# Patient Record
Sex: Female | Born: 1950 | Race: Black or African American | Hispanic: No | Marital: Married | Smoking: Never smoker
Health system: Southern US, Community
[De-identification: ages and names within clinical notes are randomized; demographics above are authoritative.]

## PROBLEM LIST (undated history)

## (undated) DIAGNOSIS — R110 Nausea: Secondary | ICD-10-CM

## (undated) DIAGNOSIS — Z973 Presence of spectacles and contact lenses: Secondary | ICD-10-CM

## (undated) DIAGNOSIS — T451X5A Adverse effect of antineoplastic and immunosuppressive drugs, initial encounter: Secondary | ICD-10-CM

## (undated) DIAGNOSIS — C539 Malignant neoplasm of cervix uteri, unspecified: Secondary | ICD-10-CM

## (undated) DIAGNOSIS — R918 Other nonspecific abnormal finding of lung field: Secondary | ICD-10-CM

## (undated) DIAGNOSIS — T4145XA Adverse effect of unspecified anesthetic, initial encounter: Secondary | ICD-10-CM

## (undated) DIAGNOSIS — D701 Agranulocytosis secondary to cancer chemotherapy: Secondary | ICD-10-CM

## (undated) DIAGNOSIS — E041 Nontoxic single thyroid nodule: Secondary | ICD-10-CM

## (undated) DIAGNOSIS — Z86010 Personal history of colonic polyps: Secondary | ICD-10-CM

## (undated) DIAGNOSIS — T8859XA Other complications of anesthesia, initial encounter: Secondary | ICD-10-CM

## (undated) DIAGNOSIS — Z923 Personal history of irradiation: Secondary | ICD-10-CM

## (undated) HISTORY — DX: Personal history of irradiation: Z92.3

## (undated) HISTORY — PX: TONSILLECTOMY: SUR1361

---

## 2008-06-22 ENCOUNTER — Emergency Department (HOSPITAL_COMMUNITY): Admission: EM | Admit: 2008-06-22 | Discharge: 2008-06-22 | Payer: Self-pay | Admitting: Emergency Medicine

## 2013-10-16 DIAGNOSIS — Z860100 Personal history of colon polyps, unspecified: Secondary | ICD-10-CM

## 2013-10-16 HISTORY — PX: COLONOSCOPY: SHX174

## 2013-10-16 HISTORY — DX: Personal history of colon polyps, unspecified: Z86.0100

## 2013-10-16 HISTORY — DX: Personal history of colonic polyps: Z86.010

## 2017-08-03 ENCOUNTER — Telehealth: Payer: Self-pay | Admitting: *Deleted

## 2017-08-03 NOTE — Telephone Encounter (Signed)
Spoke with a nurse from [facility] regarding a referral from Dr. Charlesetta Garibaldi. Patient to be seen on November 7th at  10:15am. They will call the patient and fax over the records.

## 2017-08-07 ENCOUNTER — Telehealth: Payer: Self-pay | Admitting: Gynecology

## 2017-08-07 ENCOUNTER — Other Ambulatory Visit: Payer: Self-pay | Admitting: Obstetrics and Gynecology

## 2017-08-07 DIAGNOSIS — N838 Other noninflammatory disorders of ovary, fallopian tube and broad ligament: Secondary | ICD-10-CM

## 2017-08-07 NOTE — Telephone Encounter (Signed)
Received a call from Ulysses at [facility] to get an earlier appt for the pt. Appt has been scheduled for the pt to see Dr. Fermin Schwab on 11/2 at 145pm. [place] will notify the pt.

## 2017-08-17 ENCOUNTER — Encounter: Payer: Self-pay | Admitting: Gynecology

## 2017-08-17 ENCOUNTER — Ambulatory Visit: Payer: Medicare HMO | Attending: Gynecologic Oncology | Admitting: Gynecology

## 2017-08-17 ENCOUNTER — Encounter: Payer: Self-pay | Admitting: Radiation Oncology

## 2017-08-17 VITALS — BP 160/78 | HR 112 | Temp 97.9°F | Resp 20 | Ht 65.0 in | Wt 167.8 lb

## 2017-08-17 DIAGNOSIS — R87613 High grade squamous intraepithelial lesion on cytologic smear of cervix (HGSIL): Secondary | ICD-10-CM | POA: Insufficient documentation

## 2017-08-17 DIAGNOSIS — N839 Noninflammatory disorder of ovary, fallopian tube and broad ligament, unspecified: Secondary | ICD-10-CM

## 2017-08-17 DIAGNOSIS — C539 Malignant neoplasm of cervix uteri, unspecified: Secondary | ICD-10-CM | POA: Diagnosis not present

## 2017-08-17 DIAGNOSIS — I341 Nonrheumatic mitral (valve) prolapse: Secondary | ICD-10-CM | POA: Insufficient documentation

## 2017-08-17 DIAGNOSIS — N838 Other noninflammatory disorders of ovary, fallopian tube and broad ligament: Secondary | ICD-10-CM

## 2017-08-17 DIAGNOSIS — C541 Malignant neoplasm of endometrium: Secondary | ICD-10-CM | POA: Diagnosis not present

## 2017-08-17 NOTE — Progress Notes (Signed)
Consult Note: Gyn-Onc   Maureen Vaughan 66 y.o. female  Chief Complaint  Patient presents with  . endometrial Cancer  . 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 Solid complex 2.1 x 1.5 x 2.3 cm left adnexal mass.  Plan: The patient is pink she scheduled to have an MRI next Tuesday. I would also like to obtain a PET scan to assess for any distant disease and potentially evaluate the left adnexal mass. Ultimately if the disease couldn't seems confined to the pelvis I recommend the patient received whole pelvis radiation therapy, brachytherapy and cisplatin sensitization. If there is any suspicion that there is disease involving the left ovary the ovary could be removed laparoscopically.  The schedule for external beam radiation therapy, brachytherapy, and weekly cisplatin was outlined to the patient and her husband. Expected side effects were detailed.  HPI: 66 year old African American female seen in consultation at the request of Dr. Charlesetta Garibaldi regarding management of a newly diagnosed adenocarcinoma. The patient initially presented with postmenopausal spotting. The patient is had an extensive evaluation with the following findings. Vaginal ultrasound showed a 10 cm uterus with an intramural fibroid, left ovary is slightly enlarged measuring 2.1 x 1.5 x 2.3 cm which is described as solid complex with a vascular flow. In addition the cervix is imaged showing a solid vascular mass measuring 2.7 x 2.1 x 2.2 cm. A sonohysterogram showed no abnormalities in the endometrium. Patient had a Pap smear showing high-grade squamous intraepithelial lesion with features suspicious for invasion as well as atypical glandular cells. 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  The patient says she has had Pap smears in the last 2 years that were normal. She has no  family history of gynecologic cancers. She has not had any pelvic surgery. Overall her health is good.  Review of Systems:10 point review of systems is negative except as noted in interval history.   Vitals: Blood pressure (!) 160/78, pulse (!) 112, temperature 97.9 F (36.6 C), temperature source Oral, resp. rate 20, height 5\' 5"  (1.651 m), weight 167 lb 12.8 oz (76.1 kg), SpO2 97 %.  Physical Exam: General : The patient is a healthy woman in no acute distress.  HEENT: normocephalic, extraoccular movements normal; neck is supple without thyromegally  Lynphnodes: Supraclavicular and inguinal nodes not enlarged  Abdomen: Soft, non-tender, no ascites, no organomegally, no masses, no hernias  Pelvic:  EGBUS: Normal female  Vagina: Normal, no lesions  Urethra and Bladder: Normal, non-tender  Cervix: Is replaced by a hard nodular mass measured approximately 6 cm in diameter  Uterus: Difficult to outline given the cervical mass. Ultrasound says that is 10 cm. Bi-manual examination: There appears to be extension into the medial aspects of the parametria bilaterally (stage IIB) Rectal: normal sphincter tone, no masses, no blood  Lower extremities: No edema or varicosities. Normal range of motion      Not on File  Past Medical History:  Diagnosis Date  . Mitral valve prolapse     History reviewed. No pertinent surgical history.  No current outpatient prescriptions on file.   No current facility-administered medications for this visit.     Social History   Social History  . Marital status: Married    Spouse name: N/A  . Number of children: N/A  . Years of education: N/A   Occupational History  .  Not on file.   Social History Main Topics  . Smoking status: Never Smoker  . Smokeless tobacco: Never Used  . Alcohol use No  . Drug use: No  . Sexual activity: Yes   Other Topics Concern  . Not on file   Social History Narrative  . No narrative on file    Family History   Problem Relation Age of Onset  . Hypertension Mother   . Dementia Mother   . Colon cancer Father       Marti Sleigh, MD 08/17/2017, 2:17 PM

## 2017-08-17 NOTE — Patient Instructions (Addendum)
Plan on having a PET scan on November 14 at [facility] (nothing to eat or drink six hours before your scan) and meet with Dr. Gery Pray that afternoon (Nov 14) at 12:30 at the [facility].  We will also arrange for you to meet with Dr. Heath Lark, Medical Oncologist, to discuss and arrange for chemotherapy (weekly cisplatin).  Recommendation is for external beam radiation with weekly cisplatin chemotherapy followed by internal radiation treatments at the cervix.  Please call for any questions or concerns.

## 2017-08-20 ENCOUNTER — Telehealth: Payer: Self-pay | Admitting: *Deleted

## 2017-08-20 NOTE — Telephone Encounter (Signed)
Called patient with the new patient appt with Dr. Alvy Bimler on November 16th at 11:15am, arrive at 11am.

## 2017-08-21 ENCOUNTER — Inpatient Hospital Stay
Admission: RE | Admit: 2017-08-21 | Discharge: 2017-08-21 | Disposition: A | Discharge status: Home / Self Care | Payer: Self-pay | Source: Ambulatory Visit | Attending: Obstetrics and Gynecology | Admitting: Obstetrics and Gynecology

## 2017-08-21 ENCOUNTER — Other Ambulatory Visit: Payer: Self-pay

## 2017-08-22 ENCOUNTER — Ambulatory Visit: Payer: Self-pay | Admitting: Gynecologic Oncology

## 2017-08-24 NOTE — Progress Notes (Signed)
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 Alvy Bimler 08/31/17  Weight changes, if any: no  Bowel/Bladder complaints, if any: has noticed a small amount of urinary incontinence that started a few months ago. Denies having any bowel issues.  Nausea/Vomiting, if any: no  Pain issues, if any:  no  SAFETY ISSUES:  Prior radiation? no  Pacemaker/ICD? no  Possible current pregnancy? no  Is the patient on methotrexate? no  Current Complaints / other details:  Per Dr. Fermin Schwab - the plan is that "ultimately if the disease couldn't seems confined to the pelvis I recommend the patient received whole pelvis radiation therapy, brachytherapy and cisplatin sensitization. If there is any suspicion that there is disease involving the left ovary the ovary could be removed laparoscopically."  PET scan scheduled for 08/29/17.  BP (!) 144/74 (BP Location: Right Arm, Patient Position: Sitting)   Pulse 84   Temp 98.1 F (36.7 C) (Oral)   Ht 5\' 5"  (1.651 m)   Wt 164 lb 3.2 oz (74.5 kg)   SpO2 100%   BMI 27.32 kg/m    Wt Readings from Last 3 Encounters:  08/29/17 164 lb 3.2 oz (74.5 kg)  08/17/17 167 lb 12.8 oz (76.1 kg)

## 2017-08-28 ENCOUNTER — Telehealth: Payer: Self-pay

## 2017-08-28 NOTE — Telephone Encounter (Signed)
ENCOUNTER OPENED IN ERROR

## 2017-08-29 ENCOUNTER — Ambulatory Visit
Admission: RE | Admit: 2017-08-29 | Discharge: 2017-08-29 | Disposition: A | Discharge status: Home / Self Care | Payer: Medicare HMO | Source: Ambulatory Visit | Attending: Radiation Oncology | Admitting: Radiation Oncology

## 2017-08-29 ENCOUNTER — Encounter: Payer: Self-pay | Admitting: Radiation Oncology

## 2017-08-29 ENCOUNTER — Ambulatory Visit (HOSPITAL_COMMUNITY)
Admission: RE | Admit: 2017-08-29 | Discharge: 2017-08-29 | Disposition: A | Discharge status: Home / Self Care | Payer: Medicare HMO | Source: Ambulatory Visit | Attending: Gynecologic Oncology | Admitting: Gynecologic Oncology

## 2017-08-29 ENCOUNTER — Other Ambulatory Visit: Payer: Self-pay

## 2017-08-29 VITALS — BP 144/74 | HR 84 | Temp 98.1°F | Ht 65.0 in | Wt 164.2 lb

## 2017-08-29 DIAGNOSIS — R590 Localized enlarged lymph nodes: Secondary | ICD-10-CM | POA: Diagnosis not present

## 2017-08-29 DIAGNOSIS — C539 Malignant neoplasm of cervix uteri, unspecified: Secondary | ICD-10-CM | POA: Insufficient documentation

## 2017-08-29 DIAGNOSIS — Z800 Family history of malignant neoplasm of digestive organs: Secondary | ICD-10-CM | POA: Diagnosis not present

## 2017-08-29 DIAGNOSIS — C775 Secondary and unspecified malignant neoplasm of intrapelvic lymph nodes: Secondary | ICD-10-CM | POA: Diagnosis not present

## 2017-08-29 DIAGNOSIS — Z79899 Other long term (current) drug therapy: Secondary | ICD-10-CM | POA: Diagnosis not present

## 2017-08-29 DIAGNOSIS — Z8249 Family history of ischemic heart disease and other diseases of the circulatory system: Secondary | ICD-10-CM | POA: Insufficient documentation

## 2017-08-29 DIAGNOSIS — R918 Other nonspecific abnormal finding of lung field: Secondary | ICD-10-CM | POA: Insufficient documentation

## 2017-08-29 DIAGNOSIS — C530 Malignant neoplasm of endocervix: Secondary | ICD-10-CM | POA: Insufficient documentation

## 2017-08-29 DIAGNOSIS — Z780 Asymptomatic menopausal state: Secondary | ICD-10-CM | POA: Diagnosis not present

## 2017-08-29 DIAGNOSIS — Z510 Encounter for antineoplastic radiation therapy: Secondary | ICD-10-CM | POA: Diagnosis not present

## 2017-08-29 DIAGNOSIS — Z9889 Other specified postprocedural states: Secondary | ICD-10-CM | POA: Insufficient documentation

## 2017-08-29 DIAGNOSIS — I341 Nonrheumatic mitral (valve) prolapse: Secondary | ICD-10-CM | POA: Diagnosis not present

## 2017-08-29 DIAGNOSIS — Z8489 Family history of other specified conditions: Secondary | ICD-10-CM | POA: Diagnosis not present

## 2017-08-29 DIAGNOSIS — E042 Nontoxic multinodular goiter: Secondary | ICD-10-CM | POA: Diagnosis not present

## 2017-08-29 LAB — CBC WITH DIFFERENTIAL/PLATELET
BASO%: 0.6 % (ref 0.0–2.0)
BASOS ABS: 0 10*3/uL (ref 0.0–0.1)
EOS%: 0.8 % (ref 0.0–7.0)
Eosinophils Absolute: 0 10*3/uL (ref 0.0–0.5)
HEMATOCRIT: 39.3 % (ref 34.8–46.6)
HEMOGLOBIN: 12.9 g/dL (ref 11.6–15.9)
LYMPH#: 1.2 10*3/uL (ref 0.9–3.3)
LYMPH%: 28.7 % (ref 14.0–49.7)
MCH: 28.8 pg (ref 25.1–34.0)
MCHC: 32.9 g/dL (ref 31.5–36.0)
MCV: 87.6 fL (ref 79.5–101.0)
MONO#: 0.3 10*3/uL (ref 0.1–0.9)
MONO%: 7 % (ref 0.0–14.0)
NEUT%: 62.9 % (ref 38.4–76.8)
NEUTROS ABS: 2.6 10*3/uL (ref 1.5–6.5)
Platelets: 232 10*3/uL (ref 145–400)
RBC: 4.49 10*6/uL (ref 3.70–5.45)
RDW: 12.8 % (ref 11.2–14.5)
WBC: 4.2 10*3/uL (ref 3.9–10.3)

## 2017-08-29 LAB — COMPREHENSIVE METABOLIC PANEL
ALT: 10 U/L (ref 0–55)
AST: 17 U/L (ref 5–34)
Albumin: 4.2 g/dL (ref 3.5–5.0)
Alkaline Phosphatase: 79 U/L (ref 40–150)
Anion Gap: 9 mEq/L (ref 3–11)
BILIRUBIN TOTAL: 0.46 mg/dL (ref 0.20–1.20)
BUN: 9.6 mg/dL (ref 7.0–26.0)
CO2: 27 meq/L (ref 22–29)
Calcium: 10 mg/dL (ref 8.4–10.4)
Chloride: 106 mEq/L (ref 98–109)
Creatinine: 1 mg/dL (ref 0.6–1.1)
EGFR: 58 mL/min/{1.73_m2} — AB (ref >60)
GLUCOSE: 86 mg/dL (ref 70–140)
Potassium: 4.2 mEq/L (ref 3.5–5.1)
SODIUM: 142 meq/L (ref 136–145)
TOTAL PROTEIN: 7.9 g/dL (ref 6.4–8.3)

## 2017-08-29 LAB — MAGNESIUM: MAGNESIUM: 2.1 mg/dL (ref 1.5–2.5)

## 2017-08-29 LAB — GLUCOSE, CAPILLARY: GLUCOSE-CAPILLARY: 104 mg/dL — AB (ref 65–99)

## 2017-08-29 MED ORDER — FLUDEOXYGLUCOSE F - 18 (FDG) INJECTION
8.3000 | Freq: Once | INTRAVENOUS | Status: AC | PRN
  Administered 2017-08-29: 8.3 via INTRAVENOUS

## 2017-08-29 NOTE — Progress Notes (Signed)
Radiation Oncology         [phone] ________________________________  Initial Outpatient Consultation  Name: Maureen Vaughan MRN: [id number]  Date: 08/29/2017  DOB: 1951/04/10  [id number], Maureen Rossetti, MD  Marti Sleigh   REFERRING PHYSICIAN: Marti Sleigh  DIAGNOSIS: Stage II-B adenocarcinoma of the cervix with radiographically PET+ left external iliac adenopathy   HISTORY OF PRESENT ILLNESS:Maureen Vaughan is a 66 y.o. female who is here to discuss the role of radiotherapy in the management of her cervical cancer. Initially, the patient presented with postmenopausal spotting dating back several months. She presented into her OB/GYN's office regarding this, at that time she had workup including vaginal [place] which showed a 10cm uterus with an intramural fibroid., the left ovary was slightly enlarged measuring 2.1 x 1.5 x 2.3cm which was described as solid complex with vascular flow. Additionally, the cervix contained a solid vascularized mass measuring at 2.7 x 2.1 x 2.2cm. Sonohysterography was performed which did not show abnormality within the endometrium. Following this, she had a pap smear which demonstrated the presence of high-grade squamous intraepithelial lesion with features suspicious for invasion as well as atypical glandular cells. Endometrium biopsy was on taken 08/01/17 which revealed grade 1 endometrioid adenocarcinoma.   On 08/17/17 she consulted with Dr Fermin Schwab of [facility]. Pelvic exam at that time revealed an enlarged cervix with bilateral parametrial extension. He has ordered a PET/CT which will be performed  today. Additionally, he is recommending whole pelvis radiation therapy with brachytherapy and cisplatin sensitization if the disease is confined to the pelvis. An MRI has also been ordered, but no definitive date has been scheduled for this yet secondary to insurance reasons.   Overall, she reports that prior to her diagnosis that she had routine pap  smears which had all been normal until her most recent one. She has only had postmenopausal spotting prior to her diagnosis, but otherwise she was feeling at her baseline and without acute complaint. She denies pelvic pain, diarrhea, constipation, hematuria, hematochezia, melena, fatigue, loss of appetite, or any other associated symptoms. As far as she knows, she does not have any history of cancer within her family. She has not had any major surgeries within the pelvis.   On review of systems, pt denies fever, chills, rash, mouth sores, weight loss, decreased appetite, urinary complaints, leg swelling. Denies pain, headache, or visual issues. Pt denies abdominal pain, nausea, vomiting. Notable for that as listed in the above HPI.  PREVIOUS RADIATION THERAPY: No  PAST MEDICAL HISTORY:  has a past medical history of Mitral valve prolapse.    PAST SURGICAL HISTORY: Past Surgical History:  Procedure Laterality Date  . TONSILECTOMY, ADENOIDECTOMY, BILATERAL MYRINGOTOMY AND TUBES      FAMILY HISTORY: family history includes Colon cancer in her father; Dementia in her mother; Hypertension in her mother.  SOCIAL HISTORY:  reports that  has never smoked. she has never used smokeless tobacco. She reports that she does not drink alcohol or use drugs.  ALLERGIES: Patient has no known allergies.  MEDICATIONS:  Current Outpatient Medications  Medication Sig Dispense Refill  . AMOXICILLIN PO 500 mg.    . Multiple Vitamins-Minerals (MULTIVITAMIN ADULT EXTRA C PO) multivitamin  1 q day     No current facility-administered medications for this encounter.     REVIEW OF SYSTEMS:  A 10+ POINT REVIEW OF SYSTEMS WAS OBTAINED including neurology, dermatology, psychiatry, cardiac, respiratory, lymph, extremities, GI, GU, Musculoskeletal, constitutional, breasts, reproductive, HEENT.  All pertinent positives are noted in  the HPI.  All others are negative.   PHYSICAL EXAM:  height is 5\' 5"  (1.651 m) and  weight is 164 lb 3.2 oz (74.5 kg). Her oral temperature is 98.1 F (36.7 C). Her blood pressure is 144/74 (abnormal) and her pulse is 84. Her oxygen saturation is 100%.   General: Alert and oriented, in no acute distress HEENT: Head is normocephalic. Extraocular movements are intact. Oropharynx is clear. Neck: Neck is supple, no palpable cervical or supraclavicular lymphadenopathy. Heart: Regular in rate and rhythm with no murmurs, rubs, or gallops. Chest: Clear to auscultation bilaterally, with no rhonchi, wheezes, or rales. Abdomen: Soft, nontender, nondistended, with no rigidity or guarding. Extremities: No cyanosis or edema. Lymphatics: see Neck Exam Skin: No concerning lesions. Musculoskeletal: symmetric strength and muscle tone throughout. Neurologic: Cranial nerves II through XII are grossly intact. No obvious focalities. Speech is fluent. Coordination is intact. Psychiatric: Judgment and insight are intact. Affect is appropriate. On pelvic examination the external genitalia were unremarkable. A speculum exam was performed with a large mass noted in the proximal vagina. With palpation she has a rock hard mass replacing the cervix, estimated to be at least 6cm on bimanual and rectovaginal exam. Some oozing is noted from the cervical os region, but no significant bleeding is noted. No obvious vaginal involvement. Possible right parametrial involvement on exam. Slightly decreased rectal tone is noted on digital rectal exam.   ECOG = 0  LABORATORY DATA:  Lab Results  Component Value Date   WBC 4.2 08/29/2017   HGB 12.9 08/29/2017   HCT 39.3 08/29/2017   MCV 87.6 08/29/2017   PLT 232 08/29/2017   NEUTROABS 2.6 08/29/2017   Lab Results  Component Value Date   NA 142 08/29/2017   K 4.2 08/29/2017   CO2 27 08/29/2017   GLUCOSE 86 08/29/2017   CREATININE 1.0 08/29/2017   CALCIUM 10.0 08/29/2017    RADIOGRAPHY: Nm Pet Image Initial (pi) Skull Base To Thigh  Result Date:  08/29/2017 CLINICAL DATA:  Initial treatment strategy for cervical cancer. EXAM: NUCLEAR MEDICINE PET SKULL BASE TO THIGH TECHNIQUE: 8.3 mCi F-18 FDG was injected intravenously. Full-ring PET imaging was performed from the skull base to thigh after the radiotracer. CT data was obtained and used for attenuation correction and anatomic localization. FASTING BLOOD GLUCOSE:  Value: 104 mg/dl COMPARISON:  None. FINDINGS: NECK Hypodense right thyroid nodule 3.6 cm in diameter, without accentuated hypermetabolic activity. Smaller 1.3 cm isthmic hypodense nodule. Left thyroid nodule 0.9 cm in diameter. CHEST Twelve scattered small pulmonary nodules measuring up to 5 mm in diameter do not appear appreciably hypermetabolic but which are below sensitive PET-CT size thresholds. No hypermetabolic thoracic adenopathy. ABDOMEN/PELVIS The dominant cervical mass has a maximum SUV of 16.0 with hypermetabolic activity measuring approximately 6.2 by 6.0 by 7.3 cm. A left external iliac lymph node adjacent to the pelvic sidewall measures 1.5 cm in short axis on image 162/4, with maximum SUV 10.0. No abnormal activity in the liver, spleen, pancreas, or adrenal glands. There are a few sigmoid colon diverticula. SKELETON No focal hypermetabolic activity to suggest skeletal metastasis. IMPRESSION: 1. Cervical malignancy with maximum SUV 6.0, dominant cervical mass measuring 6.2 by 6.0 by 7.3 cm. 2. 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 3. There 12 scattered small pulmonary nodules measuring up to 5 mm in diameter. No hypermetabolic activity but these are below sensitive PET-CT size thresholds. These could be postinflammatory or less likely  neoplastic, surveillance is recommended. 4. Hypodense thyroid nodules are not hypermetabolic which strongly favors benign etiology. Electronically Signed   By: Van Clines M.D.   On: 08/29/2017 13:19      IMPRESSION: Maureen Vaughan is a  very pleasant 66 y.o. female who presents today to discuss the role of radiotherapy in the ongoing management of their stage II-B adenocarcinoma of the cervix with radiographically PET+ left external iliac adenopathy. It was a pleasure meeting the patient today. We discussed at great length the current state of her cancer and the overall goals of care. The patient would be a good candidate for a definitive course of therapy including external beam radiotherapy, radiosenstizing chemotherapy, and intracavitary brachytherapy treatment. The risks vs benefits, side effects, and potential toxicities were discussed at great length with the patient involving  high dose radiation directed at the pelvis. The patient appears to understand and wishes to proceed with planned course of treatment.   PLAN:  She will be scheduled for CT sim on November 19th with treatments likely to begin in the last week of november along with radiosensitizing chemotherapy. I anticipate approximately 6wks of EBRT, plus 5 intracavitary high dose brachytherapy treatments. We will check on the status of the pt's MRI which would be helpful in planning her brachytherapy treatments. I have informed her to call the clinic with any questions or concerns in the interim should she have any. She will meet with medical oncology later this week. ------------------------------------------------  Blair Promise, PhD, MD  This document serves as a record of services personally performed by Gery Pray, MD. It was created on his behalf by Reola Mosher, a trained medical scribe. The creation of this record is based on the scribe's personal observations and the provider's statements to them. This document has been checked and approved by the attending provider.

## 2017-08-31 ENCOUNTER — Telehealth: Payer: Self-pay | Admitting: Hematology and Oncology

## 2017-08-31 ENCOUNTER — Encounter: Payer: Self-pay | Admitting: Hematology and Oncology

## 2017-08-31 ENCOUNTER — Ambulatory Visit (HOSPITAL_BASED_OUTPATIENT_CLINIC_OR_DEPARTMENT_OTHER): Payer: Medicare HMO | Admitting: Hematology and Oncology

## 2017-08-31 VITALS — BP 151/71 | HR 111 | Temp 98.2°F | Resp 18 | Ht 65.0 in | Wt 164.2 lb

## 2017-08-31 DIAGNOSIS — C541 Malignant neoplasm of endometrium: Secondary | ICD-10-CM | POA: Diagnosis not present

## 2017-08-31 DIAGNOSIS — R918 Other nonspecific abnormal finding of lung field: Secondary | ICD-10-CM | POA: Insufficient documentation

## 2017-08-31 DIAGNOSIS — C774 Secondary and unspecified malignant neoplasm of inguinal and lower limb lymph nodes: Secondary | ICD-10-CM | POA: Diagnosis not present

## 2017-08-31 DIAGNOSIS — C539 Malignant neoplasm of cervix uteri, unspecified: Secondary | ICD-10-CM

## 2017-08-31 NOTE — Assessment & Plan Note (Signed)
She has multiple indeterminate lung nodules The cause is unknown Malignancy cannot be excluded It is not possible to order lung biopsy To give her the benefit of the doubt, we will proceed with concurrent chemoradiation therapy with curative intent She would need repeat imaging study of the chest sooner than later after completion of treatment Currently, the patient is not symptomatic

## 2017-08-31 NOTE — Progress Notes (Signed)
START OFF PATHWAY REGIMEN - [Other Dx]   [id number]:Cisplatin 40 mg/m2 weekly (4 weeks per order sheet):   Administer weekly:     Cisplatin   **Always confirm dose/schedule in your pharmacy ordering system**    Patient Characteristics: Intent of Therapy: Curative Intent, Discussed with Patient

## 2017-08-31 NOTE — Progress Notes (Signed)
[place] CONSULT NOTE  Patient Care Team: Cloward, [name], MD as PCP - General (Internal Medicine) Heath Lark, MD as Consulting Physician (Hematology and Oncology)  CHIEF COMPLAINTS/PURPOSE OF CONSULTATION:  Newly diagnosed cervical cancer, for concurrent chemoradiation therapy  HISTORY OF PRESENTING ILLNESS:  Maureen Vaughan 66 y.o. female is here accompanied by her husband, Ron They have been married for 41 years She is a retired Pharmacist, hospital.  She has 1 daughter and 2 grandchildren. The patient denies prior history of abnormal Pap smear. She started to have postmenopausal vaginal spotting several months ago and underwent further evaluation and was subsequently diagnosed with cervical cancer. I have review her records and summarized as follows:   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[place])   07/04/2017 Initial Diagnosis    She presented to the GYN clinic with postmenopausal bleeding. Examination revealed abnormal cervix. Vaginal ultrasound showed a 10 cm uterus with an intramural fibroid, left ovary is slightly enlarged measuring 2.1 x 1.5 x 2.3 cm which is described as solid complex with a vascular flow. In addition the cervix is imaged showing a solid vascular mass measuring 2.7 x 2.1 x 2.2 cm. A sonohysterogram showed no abnormalities in the endometrium. Patient had a Pap smear showing high-grade squamous intraepithelial lesion with features suspicious for invasion as well as atypical glandular cells. 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      08/29/2017 PET scan    1. Cervical malignancy with maximum SUV 6.0, dominant cervical mass measuring 6.2 by 6.0 by 7.3 cm. 2. 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 3. There 12 scattered small pulmonary nodules measuring up to 5 mm in diameter. No hypermetabolic activity but these are below sensitive PET-CT  size thresholds. These could be postinflammatory or less likely neoplastic, surveillance is recommended. 4. Hypodense thyroid nodules are not hypermetabolic which strongly favors benign etiology.      She continues to have sporadic vaginal spotting.  She denies pelvic pain.  She denies any interference with her bowel function or difficulties with urination Her appetite is stable. She had recent intentional weight loss, stable.  MEDICAL HISTORY:  Past Medical History:  Diagnosis Date  . Mitral valve prolapse     SURGICAL HISTORY: Past Surgical History:  Procedure Laterality Date  . COLONOSCOPY    . TONSILECTOMY, ADENOIDECTOMY, BILATERAL MYRINGOTOMY AND TUBES      SOCIAL HISTORY: Social History   Socioeconomic History  . Marital status: Married    Spouse name: Not on file  . Number of children: 1  . Years of education: Not on file  . Highest education level: Not on file  Social Needs  . Financial resource strain: Not on file  . Food insecurity - worry: Not on file  . Food insecurity - inability: Not on file  . Transportation needs - medical: Not on file  . Transportation needs - non-medical: Not on file  Occupational History  . Occupation: retired  Tobacco Use  . Smoking status: Never Smoker  . Smokeless tobacco: Never Used  Substance and Sexual Activity  . Alcohol use: No  . Drug use: No  . Sexual activity: Yes  Other Topics Concern  . Not on file  Social History Narrative  . Not on file    FAMILY HISTORY: Family History  Problem Relation Age of Onset  . Hypertension Mother   . Dementia Mother   .  Colon cancer Father 54    ALLERGIES:  has No Known Allergies.  MEDICATIONS:  Current Outpatient Medications  Medication Sig Dispense Refill  . AMOXICILLIN PO 500 mg.    . Multiple Vitamins-Minerals (MULTIVITAMIN ADULT EXTRA C PO) multivitamin  1 q day     No current facility-administered medications for this visit.     REVIEW OF SYSTEMS:    Constitutional: Denies fevers, chills or abnormal night sweats Eyes: Denies blurriness of vision, double vision or watery eyes Ears, nose, mouth, throat, and face: Denies mucositis or sore throat Respiratory: Denies cough, dyspnea or wheezes Cardiovascular: Denies palpitation, chest discomfort or lower extremity swelling Gastrointestinal:  Denies nausea, heartburn or change in bowel habits Skin: Denies abnormal skin rashes Lymphatics: Denies new lymphadenopathy or easy bruising Neurological:Denies numbness, tingling or new weaknesses Behavioral/Psych: Mood is stable, no new changes  All other systems were reviewed with the patient and are negative.  PHYSICAL EXAMINATION: ECOG PERFORMANCE STATUS: 1 - Symptomatic but completely ambulatory  Vitals:   08/31/17 1055  BP: (!) 151/71  Pulse: (!) 111  Resp: 18  Temp: 98.2 F (36.8 C)  SpO2: 100%   Filed Weights   08/31/17 1055  Weight: 164 lb 3.2 oz (74.5 kg)    GENERAL:alert, no distress and comfortable SKIN: skin color, texture, turgor are normal, no rashes or significant lesions EYES: normal, conjunctiva are pink and non-injected, sclera clear OROPHARYNX:no exudate, no erythema and lips, buccal mucosa, and tongue normal  NECK: supple, thyroid normal size, non-tender, without nodularity LYMPH:  no palpable lymphadenopathy in the cervical, axillary or inguinal LUNGS: clear to auscultation and percussion with normal breathing effort HEART: regular rate & rhythm and no murmurs and no lower extremity edema ABDOMEN:abdomen soft, non-tender and normal bowel sounds Musculoskeletal:no cyanosis of digits and no clubbing  PSYCH: alert & oriented x 3 with fluent speech NEURO: no focal motor/sensory deficits  LABORATORY DATA:  I have reviewed the data as listed Lab Results  Component Value Date   WBC 4.2 08/29/2017   HGB 12.9 08/29/2017   HCT 39.3 08/29/2017   MCV 87.6 08/29/2017   PLT 232 08/29/2017   Recent Labs     08/29/17 1440  NA 142  K 4.2  CO2 27  GLUCOSE 86  BUN 9.6  CREATININE 1.0  CALCIUM 10.0  PROT 7.9  ALBUMIN 4.2  AST 17  ALT 10  ALKPHOS 79  BILITOT 0.46    RADIOGRAPHIC STUDIES: I have reviewed the imaging study with the patient and her husband  I have personally reviewed the radiological images as listed and agreed with the findings in the report. Nm Pet Image Initial (pi) Skull Base To Thigh  Result Date: 08/29/2017 CLINICAL DATA:  Initial treatment strategy for cervical cancer. EXAM: NUCLEAR MEDICINE PET SKULL BASE TO THIGH TECHNIQUE: 8.3 mCi F-18 FDG was injected intravenously. Full-ring PET imaging was performed from the skull base to thigh after the radiotracer. CT data was obtained and used for attenuation correction and anatomic localization. FASTING BLOOD GLUCOSE:  Value: 104 mg/dl COMPARISON:  None. FINDINGS: NECK Hypodense right thyroid nodule 3.6 cm in diameter, without accentuated hypermetabolic activity. Smaller 1.3 cm isthmic hypodense nodule. Left thyroid nodule 0.9 cm in diameter. CHEST Twelve scattered small pulmonary nodules measuring up to 5 mm in diameter do not appear appreciably hypermetabolic but which are below sensitive PET-CT size thresholds. No hypermetabolic thoracic adenopathy. ABDOMEN/PELVIS The dominant cervical mass has a maximum SUV of 16.0 with hypermetabolic activity measuring approximately 6.2 by 6.0  by 7.3 cm. A left external iliac lymph node adjacent to the pelvic sidewall measures 1.5 cm in short axis on image 162/4, with maximum SUV 10.0. No abnormal activity in the liver, spleen, pancreas, or adrenal glands. There are a few sigmoid colon diverticula. SKELETON No focal hypermetabolic activity to suggest skeletal metastasis. IMPRESSION: 1. Cervical malignancy with maximum SUV 6.0, dominant cervical mass measuring 6.2 by 6.0 by 7.3 cm. 2. 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 3. There 12 scattered  small pulmonary nodules measuring up to 5 mm in diameter. No hypermetabolic activity but these are below sensitive PET-CT size thresholds. These could be postinflammatory or less likely neoplastic, surveillance is recommended. 4. Hypodense thyroid nodules are not hypermetabolic which strongly favors benign etiology. Electronically Signed   By: Van Clines M.D.   On: 08/29/2017 13:19    ASSESSMENT & PLAN:  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[place]) We discussed the role of chemotherapy. The intent is of curative intent.  We discussed some of the risks, benefits, side-effects of cisplatin  Some of the short term side-effects included, though not limited to, including weight loss, life threatening infections, risk of allergic reactions, need for transfusions of blood products, nausea, vomiting, change in bowel habits, loss of hair, admission to hospital for various reasons, and risks of death.   Long term side-effects are also discussed including risks of infertility, permanent damage to nerve function, hearing loss, chronic fatigue, kidney damage with possibility needing hemodialysis, and rare secondary malignancy including bone marrow disorders.  The patient is aware that the response rates discussed earlier is not guaranteed.  After a long discussion, patient made an informed decision to proceed with the prescribed plan of care.   Patient education material was dispensed. I will schedule chemo education class I will schedule port placement I will coordinate the start date of treatment with radiation oncologist Tentatively, the best start date based on the schedule would be around September 12, 2017 She will be seen weekly with blood work before each dose of treatment I will see her back on September 11, 2017 for final chemotherapy consent    Multiple lung nodules She has multiple indeterminate lung nodules The cause is unknown Malignancy cannot be excluded It is not possible to order lung  biopsy To give her the benefit of the doubt, we will proceed with concurrent chemoradiation therapy with curative intent She would need repeat imaging study of the chest sooner than later after completion of treatment Currently, the patient is not symptomatic    Orders Placed This Encounter  Procedures  . IR FLUORO GUIDE PORT INSERTION RIGHT    Pt not available until 11/26    Standing Status:   Future    Standing Expiration Date:   10/31/2018    Order Specific Question:   Reason for Exam (SYMPTOM  OR DIAGNOSIS REQUIRED)    Answer:   need port for chemo.    Order Specific Question:   Preferred Imaging Location?    Answer:   [facility]  . CBC with Differential/Platelet    Standing Status:   Standing    Number of Occurrences:   22    Standing Expiration Date:   08/31/2018  . Comprehensive metabolic panel    Standing Status:   Standing    Number of Occurrences:   22    Standing Expiration Date:   08/31/2018  . Magnesium    Standing Status:  Standing    Number of Occurrences:   22    Standing Expiration Date:   08/31/2018      All questions were answered. The patient knows to call the clinic with any problems, questions or concerns. I spent 60 minutes counseling the patient face to face. The total time spent in the appointment was 80 minutes and more than 50% was on counseling.     Heath Lark, MD 08/31/2017 2:32 PM

## 2017-08-31 NOTE — Assessment & Plan Note (Signed)
We discussed the role of chemotherapy. The intent is of curative intent.  We discussed some of the risks, benefits, side-effects of cisplatin  Some of the short term side-effects included, though not limited to, including weight loss, life threatening infections, risk of allergic reactions, need for transfusions of blood products, nausea, vomiting, change in bowel habits, loss of hair, admission to hospital for various reasons, and risks of death.   Long term side-effects are also discussed including risks of infertility, permanent damage to nerve function, hearing loss, chronic fatigue, kidney damage with possibility needing hemodialysis, and rare secondary malignancy including bone marrow disorders.  The patient is aware that the response rates discussed earlier is not guaranteed.  After a long discussion, patient made an informed decision to proceed with the prescribed plan of care.   Patient education material was dispensed. I will schedule chemo education class I will schedule port placement I will coordinate the start date of treatment with radiation oncologist Tentatively, the best start date based on the schedule would be around September 12, 2017 She will be seen weekly with blood work before each dose of treatment I will see her back on September 11, 2017 for final chemotherapy consent

## 2017-08-31 NOTE — Telephone Encounter (Signed)
Scheduled appt per 11/16 los - unable to schedule 11/28 due to cap - patient is aware and will be contacted when approved for schedule - gave patient AVS and calender of appts scheduled .

## 2017-09-03 ENCOUNTER — Telehealth: Payer: Self-pay | Admitting: *Deleted

## 2017-09-03 ENCOUNTER — Encounter: Payer: Self-pay | Admitting: Radiation Oncology

## 2017-09-03 ENCOUNTER — Ambulatory Visit
Admission: RE | Admit: 2017-09-03 | Discharge: 2017-09-03 | Disposition: A | Discharge status: Home / Self Care | Payer: Medicare HMO | Source: Ambulatory Visit | Attending: Radiation Oncology | Admitting: Radiation Oncology

## 2017-09-03 VITALS — BP 151/75 | HR 103 | Temp 98.4°F | Wt 163.6 lb

## 2017-09-03 DIAGNOSIS — C539 Malignant neoplasm of cervix uteri, unspecified: Secondary | ICD-10-CM

## 2017-09-03 DIAGNOSIS — Z510 Encounter for antineoplastic radiation therapy: Secondary | ICD-10-CM | POA: Diagnosis not present

## 2017-09-03 DIAGNOSIS — C530 Malignant neoplasm of endocervix: Secondary | ICD-10-CM

## 2017-09-03 MED ORDER — SODIUM CHLORIDE 0.9% FLUSH
10.0000 mL | Freq: Once | INTRAVENOUS | Status: AC
  Administered 2017-09-03: 10 mL via INTRAVENOUS

## 2017-09-03 NOTE — Telephone Encounter (Signed)
[facility] IR will draw [facility], CMP with port placement.  Patient notified that she will have port placed at [facility] on 11/26- arrive at 0800- check in at Admitting, NPO after midnight and needs to have driver. Verbalized understanding.

## 2017-09-03 NOTE — Telephone Encounter (Signed)
-----   Message from Heath Lark, MD sent at 08/31/2017  2:31 PM EST ----- Regarding: IR port She is going out of town so I ordered port on 11/26. Please get radiologist to do CBC and CMP preop please

## 2017-09-03 NOTE — Progress Notes (Signed)
Has armband been applied?  Yes.    Does patient have an allergy to IV contrast dye?: No.   Has patient ever received premedication for IV contrast dye?:  no  Does patient take metformin?: No.  If patient does take metformin when was the last dose: n/a  Date of lab work: August 29, 2017 BUN: 9.6 CR: 1.0  IV site: antecubital right, condition patent and no redness  Has IV site been added to flowsheet?  Yes.    There were no vitals taken for this visit. BP (!) 151/75   Pulse (!) 103   Temp 98.4 F (36.9 C) (Oral)   Wt 163 lb 9.6 oz (74.2 kg)   SpO2 100%   BMI 27.22 kg/m   Wt Readings from Last 3 Encounters:  09/03/17 163 lb 9.6 oz (74.2 kg)  08/31/17 164 lb 3.2 oz (74.5 kg)  08/29/17 164 lb 3.2 oz (74.5 kg)

## 2017-09-03 NOTE — Progress Notes (Signed)
  Radiation Oncology         [phone] ________________________________  Name: Maureen Vaughan MRN: [id number]  Date: 09/03/2017  DOB: [birth year]  SIMULATION AND TREATMENT PLANNING NOTE    ICD-10-CM   1. 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  Identity was confirmed.  All relevant records and images related to the planned course of therapy were reviewed.  The patient freely provided informed written consent to proceed with treatment after reviewing the details related to the planned course of therapy. The consent form was witnessed and verified by the simulation staff.  Then, the patient was set-up in a stable reproducible  supine position for radiation therapy.  CT images were obtained.  Surface markings were placed.  The CT images were loaded into the planning software.  Then the target and avoidance structures were contoured.  Treatment planning then occurred.  The radiation prescription was entered and confirmed.  Then, I designed and supervised the construction of a total of 5 medically necessary complex treatment devices.  I have requested : 3D Simulation  I have requested a DVH of the following structures: Cervix, uterus, left external iliac node, bladder, rectum, small bowel.  I have ordered:dose calc.  PLAN:  The patient will receive 45 Gy in 25 fractions directed at the pelvis region. The patient will proceed with the sidewall boost of 9 gray in 5 fractions. She will then receive 3 additional treatments to the left external iliac node that was PET positive on CT/PET scan for an additional dose of 5.4 gray to this area. The patient will be treated with radiosensitizing chemotherapy for her first 25 treatments. After completion of external beam radiation therapy the patient will then proceed with 5  intracavitary brachytherapy treatments using iridium 192 as the high-dose-rate source.  -----------------------------------  Blair Promise, PhD, MD

## 2017-09-04 ENCOUNTER — Other Ambulatory Visit: Payer: Medicare HMO

## 2017-09-04 ENCOUNTER — Encounter: Payer: Self-pay | Admitting: *Deleted

## 2017-09-05 ENCOUNTER — Other Ambulatory Visit: Payer: Self-pay | Admitting: Radiology

## 2017-09-07 ENCOUNTER — Other Ambulatory Visit: Payer: Self-pay | Admitting: Radiology

## 2017-09-10 ENCOUNTER — Ambulatory Visit (HOSPITAL_COMMUNITY)
Admission: RE | Admit: 2017-09-10 | Discharge: 2017-09-10 | Disposition: A | Discharge status: Home / Self Care | Payer: Medicare HMO | Source: Ambulatory Visit | Attending: Hematology and Oncology | Admitting: Hematology and Oncology

## 2017-09-10 ENCOUNTER — Encounter (HOSPITAL_COMMUNITY): Payer: Self-pay | Admitting: Diagnostic Radiology

## 2017-09-10 ENCOUNTER — Other Ambulatory Visit: Payer: Self-pay | Admitting: Hematology and Oncology

## 2017-09-10 DIAGNOSIS — I341 Nonrheumatic mitral (valve) prolapse: Secondary | ICD-10-CM | POA: Insufficient documentation

## 2017-09-10 DIAGNOSIS — C539 Malignant neoplasm of cervix uteri, unspecified: Secondary | ICD-10-CM | POA: Diagnosis not present

## 2017-09-10 HISTORY — PX: IR US GUIDE VASC ACCESS RIGHT: IMG2390

## 2017-09-10 HISTORY — PX: IR FLUORO GUIDE PORT INSERTION RIGHT: IMG5741

## 2017-09-10 LAB — BASIC METABOLIC PANEL
ANION GAP: 10 (ref 5–15)
BUN: 8 mg/dL (ref 6–20)
CO2: 27 mmol/L (ref 22–32)
Calcium: 9.5 mg/dL (ref 8.9–10.3)
Chloride: 104 mmol/L (ref 101–111)
Creatinine, Ser: 1.02 mg/dL — ABNORMAL HIGH (ref 0.44–1.00)
GFR calc Af Amer: >60 mL/min (ref >60)
GFR, EST NON AFRICAN AMERICAN: 56 mL/min — AB (ref >60)
GLUCOSE: 97 mg/dL (ref 65–99)
POTASSIUM: 3.8 mmol/L (ref 3.5–5.1)
Sodium: 141 mmol/L (ref 135–145)

## 2017-09-10 LAB — CBC
HEMATOCRIT: 38.4 % (ref 36.0–46.0)
HEMOGLOBIN: 12.7 g/dL (ref 12.0–15.0)
MCH: 29.1 pg (ref 26.0–34.0)
MCHC: 33.1 g/dL (ref 30.0–36.0)
MCV: 87.9 fL (ref 78.0–100.0)
Platelets: 263 10*3/uL (ref 150–400)
RBC: 4.37 MIL/uL (ref 3.87–5.11)
RDW: 12.6 % (ref 11.5–15.5)
WBC: 4.8 10*3/uL (ref 4.0–10.5)

## 2017-09-10 LAB — PROTIME-INR
INR: 1.02
Prothrombin Time: 13.3 seconds (ref 11.4–15.2)

## 2017-09-10 MED ORDER — SODIUM CHLORIDE 0.9 % IV SOLN: INTRAVENOUS | Status: DC

## 2017-09-10 MED ORDER — FENTANYL CITRATE (PF) 100 MCG/2ML IJ SOLN
INTRAMUSCULAR | Status: AC | PRN
  Administered 2017-09-10: 50 ug via INTRAVENOUS

## 2017-09-10 MED ORDER — CEFAZOLIN SODIUM-DEXTROSE 2-4 GM/100ML-% IV SOLN
INTRAVENOUS | Status: AC
  Filled 2017-09-10: qty 100

## 2017-09-10 MED ORDER — HEPARIN SOD (PORK) LOCK FLUSH 100 UNIT/ML IV SOLN
INTRAVENOUS | Status: AC
  Filled 2017-09-10: qty 5

## 2017-09-10 MED ORDER — MIDAZOLAM HCL 2 MG/2ML IJ SOLN
INTRAMUSCULAR | Status: AC
  Filled 2017-09-10: qty 4

## 2017-09-10 MED ORDER — LIDOCAINE HCL (PF) 1 % IJ SOLN
INTRAMUSCULAR | Status: AC | PRN
  Administered 2017-09-10: 20 mL

## 2017-09-10 MED ORDER — LIDOCAINE HCL (PF) 1 % IJ SOLN
INTRAMUSCULAR | Status: AC
  Filled 2017-09-10: qty 30

## 2017-09-10 MED ORDER — MIDAZOLAM HCL 2 MG/2ML IJ SOLN
INTRAMUSCULAR | Status: AC | PRN
  Administered 2017-09-10: 1 mg via INTRAVENOUS

## 2017-09-10 MED ORDER — FENTANYL CITRATE (PF) 100 MCG/2ML IJ SOLN
INTRAMUSCULAR | Status: AC
  Filled 2017-09-10: qty 4

## 2017-09-10 MED ORDER — HEPARIN SOD (PORK) LOCK FLUSH 100 UNIT/ML IV SOLN
INTRAVENOUS | Status: AC | PRN
  Administered 2017-09-10: 500 [IU] via INTRAVENOUS

## 2017-09-10 MED ORDER — CEFAZOLIN SODIUM-DEXTROSE 2-4 GM/100ML-% IV SOLN
2.0000 g | INTRAVENOUS | Status: AC
  Administered 2017-09-10: 2 g via INTRAVENOUS

## 2017-09-10 MED ORDER — SODIUM CHLORIDE 0.9 % IV SOLN
INTRAVENOUS | Status: AC | PRN
  Administered 2017-09-10: 75 mL/h via INTRAVENOUS

## 2017-09-10 NOTE — Procedures (Signed)
Placement of right jugular port.  Tip at SVC/RA junction.  Minimal blood loss and no immediate complication.  

## 2017-09-10 NOTE — H&P (Signed)
Chief Complaint: Patient was seen in consultation today for cervical cancer  Referring Physician(s): Gorsuch,Ni  Supervising Physician: Markus Daft  Patient Status: [facility] - Out-pt  History of Present Illness: Maureen Vaughan is a 66 y.o. female with past medical history of MVP who developed post-menopausal bleeding approximately 2 months ago.  After further evaluation she was diagnosed with cervical cancer.  She now has plans for chemoradiation.   IR consulted for Port-A-Cath placement at the request of Dr. Alvy Bimler.  Patient presents for procedure today in her usual state of health.  She has been NPO. Does not take blood thinners.   Past Medical History:  Diagnosis Date  . Mitral valve prolapse     Past Surgical History:  Procedure Laterality Date  . COLONOSCOPY    . TONSILECTOMY, ADENOIDECTOMY, BILATERAL MYRINGOTOMY AND TUBES      Allergies: Patient has no known allergies.  Medications: Prior to Admission medications   Not on File     Family History  Problem Relation Age of Onset  . Hypertension Mother   . Dementia Mother   . Colon cancer Father 79    Social History   Socioeconomic History  . Marital status: Married    Spouse name: Not on file  . Number of children: 1  . Years of education: Not on file  . Highest education level: Not on file  Social Needs  . Financial resource strain: Not on file  . Food insecurity - worry: Not on file  . Food insecurity - inability: Not on file  . Transportation needs - medical: Not on file  . Transportation needs - non-medical: Not on file  Occupational History  . Occupation: retired  Tobacco Use  . Smoking status: Never Smoker  . Smokeless tobacco: Never Used  Substance and Sexual Activity  . Alcohol use: No  . Drug use: No  . Sexual activity: Yes  Other Topics Concern  . Not on file  Social History Narrative  . Not on file   Review of Systems  Constitutional: Negative for fatigue and fever.  Respiratory:  Negative for cough and shortness of breath.   Cardiovascular: Negative for chest pain.  Gastrointestinal: Negative for abdominal pain.  Musculoskeletal: Negative for back pain.  Psychiatric/Behavioral: Negative for behavioral problems and confusion.    Vital Signs: BP (!) 144/78 (BP Location: Right Arm)   Pulse 99   Temp 98.1 F (36.7 C) (Oral)   Ht 5\' 5"  (1.651 m)   Wt 162 lb (73.5 kg)   SpO2 99%   BMI 26.96 kg/m   Physical Exam  Constitutional: She is oriented to person, place, and time. She appears well-developed.  Cardiovascular: Normal rate, regular rhythm and normal heart sounds.  Pulmonary/Chest: Effort normal and breath sounds normal. No respiratory distress.  Abdominal: Soft.  Neurological: She is alert and oriented to person, place, and time.  Skin: Skin is warm and dry.  Psychiatric: She has a normal mood and affect. Her behavior is normal. Judgment and thought content normal.  Nursing note and vitals reviewed.   Imaging: Nm Pet Image Initial (pi) Skull Base To Thigh  Result Date: 08/29/2017 CLINICAL DATA:  Initial treatment strategy for cervical cancer. EXAM: NUCLEAR MEDICINE PET SKULL BASE TO THIGH TECHNIQUE: 8.3 mCi F-18 FDG was injected intravenously. Full-ring PET imaging was performed from the skull base to thigh after the radiotracer. CT data was obtained and used for attenuation correction and anatomic localization. FASTING BLOOD GLUCOSE:  Value: 104 mg/dl COMPARISON:  None.  FINDINGS: NECK Hypodense right thyroid nodule 3.6 cm in diameter, without accentuated hypermetabolic activity. Smaller 1.3 cm isthmic hypodense nodule. Left thyroid nodule 0.9 cm in diameter. CHEST Twelve scattered small pulmonary nodules measuring up to 5 mm in diameter do not appear appreciably hypermetabolic but which are below sensitive PET-CT size thresholds. No hypermetabolic thoracic adenopathy. ABDOMEN/PELVIS The dominant cervical mass has a maximum SUV of 16.0 with hypermetabolic  activity measuring approximately 6.2 by 6.0 by 7.3 cm. A left external iliac lymph node adjacent to the pelvic sidewall measures 1.5 cm in short axis on image 162/4, with maximum SUV 10.0. No abnormal activity in the liver, spleen, pancreas, or adrenal glands. There are a few sigmoid colon diverticula. SKELETON No focal hypermetabolic activity to suggest skeletal metastasis. IMPRESSION: 1. Cervical malignancy with maximum SUV 6.0, dominant cervical mass measuring 6.2 by 6.0 by 7.3 cm. 2. 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 3. There 12 scattered small pulmonary nodules measuring up to 5 mm in diameter. No hypermetabolic activity but these are below sensitive PET-CT size thresholds. These could be postinflammatory or less likely neoplastic, surveillance is recommended. 4. Hypodense thyroid nodules are not hypermetabolic which strongly favors benign etiology. Electronically Signed   By: Van Clines M.D.   On: 08/29/2017 13:19    Labs:  CBC: Recent Labs    08/29/17 1440  WBC 4.2  HGB 12.9  HCT 39.3  PLT 232    COAGS: No results for input(s): INR, APTT in the last 8760 hours.  BMP: Recent Labs    08/29/17 1440  NA 142  K 4.2  CO2 27  GLUCOSE 86  BUN 9.6  CALCIUM 10.0  CREATININE 1.0    LIVER FUNCTION TESTS: Recent Labs    08/29/17 1440  BILITOT 0.46  AST 17  ALT 10  ALKPHOS 79  PROT 7.9  ALBUMIN 4.2    TUMOR MARKERS: No results for input(s): AFPTM, CEA, CA199, CHROMGRNA in the last 8760 hours.  Assessment and Plan: Patient with past medical history of cervical cancer presents for Port-A-Cath placement at the request of Dr. Alvy Bimler. Case reviewed by Dr. Anselm Pancoast who approves patient for procedure.  Patient presents today in their usual state of health.  She has been NPO and is not currently on blood thinners.  Risks and benefits discussed with the patient including, but not limited to bleeding, infection,  pneumothorax, or fibrin sheath development and need for additional procedures. All of the patient's questions were answered, patient is agreeable to proceed. Consent signed and in chart.   Thank you for this interesting consult.  I greatly enjoyed meeting Sadiya Durand and look forward to participating in their care.  A copy of this report was sent to the requesting provider on this date.  Electronically Signed: Docia Barrier, PA 09/10/2017, 8:23 AM   I spent a total of  30 Minutes   in face to face in clinical consultation, greater than 50% of which was counseling/coordinating care for cervical cancer.

## 2017-09-10 NOTE — Discharge Instructions (Signed)
Implanted Port Insertion, Care After °This sheet gives you information about how to care for yourself after your procedure. Your health care provider may also give you more specific instructions. If you have problems or questions, contact your health care provider. °What can I expect after the procedure? °After your procedure, it is common to have: °· Discomfort at the port insertion site. °· Bruising on the skin over the port. This should improve over 3-4 days. ° °Follow these instructions at home: °Port care °· After your port is placed, you will get a manufacturer's information card. The card has information about your port. Keep this card with you at all times. °· Take care of the port as told by your health care provider. Ask your health care provider if you or a family member can get training for taking care of the port at home. A home health care nurse may also take care of the port. °· Make sure to remember what type of port you have. °Incision care °· Follow instructions from your health care provider about how to take care of your port insertion site. Make sure you: °? Wash your hands with soap and water before you change your bandage (dressing). If soap and water are not available, use hand sanitizer. °? Change your dressing as told by your health care provider. °? Leave stitches (sutures), skin glue, or adhesive strips in place. These skin closures may need to stay in place for 2 weeks or longer. If adhesive strip edges start to loosen and curl up, you may trim the loose edges. Do not remove adhesive strips completely unless your health care provider tells you to do that. °· Check your port insertion site every day for signs of infection. Check for: °? More redness, swelling, or pain. °? More fluid or blood. °? Warmth. °? Pus or a bad smell. °General instructions °· Do not take baths, swim, or use a hot tub until your health care provider approves. °· Do not lift anything that is heavier than 10 lb (4.5  kg) for a week, or as told by your health care provider. °· Ask your health care provider when it is okay to: °? Return to work or school. °? Resume usual physical activities or sports. °· Do not drive for 24 hours if you were given a medicine to help you relax (sedative). °· Take over-the-counter and prescription medicines only as told by your health care provider. °· Wear a medical alert bracelet in case of an emergency. This will tell any health care providers that you have a port. °· Keep all follow-up visits as told by your health care provider. This is important. °Contact a health care provider if: °· You cannot flush your port with saline as directed, or you cannot draw blood from the port. °· You have a fever or chills. °· You have more redness, swelling, or pain around your port insertion site. °· You have more fluid or blood coming from your port insertion site. °· Your port insertion site feels warm to the touch. °· You have pus or a bad smell coming from the port insertion site. °Get help right away if: °· You have chest pain or shortness of breath. °· You have bleeding from your port that you cannot control. °Summary °· Take care of the port as told by your health care provider. °· Change your dressing as told by your health care provider. °· Keep all follow-up visits as told by your health care provider. °  This information is not intended to replace advice given to you by your health care provider. Make sure you discuss any questions you have with your health care provider. °Document Released: 07/23/2013 Document Revised: 08/23/2016 Document Reviewed: 08/23/2016 °Elsevier Interactive Patient Education © 2017 [employer] ° °

## 2017-09-10 NOTE — Sedation Documentation (Addendum)
O2 2l/[place] started, prep complete, awaiting MD.  Pt informed of  events and procedures.

## 2017-09-10 NOTE — Sedation Documentation (Signed)
O2 d/c'd 

## 2017-09-11 ENCOUNTER — Telehealth: Payer: Self-pay | Admitting: Hematology and Oncology

## 2017-09-11 ENCOUNTER — Ambulatory Visit (HOSPITAL_BASED_OUTPATIENT_CLINIC_OR_DEPARTMENT_OTHER): Payer: Medicare HMO | Admitting: Hematology and Oncology

## 2017-09-11 VITALS — BP 136/61 | HR 119 | Temp 99.0°F | Resp 18 | Ht 65.0 in | Wt 165.1 lb

## 2017-09-11 DIAGNOSIS — C539 Malignant neoplasm of cervix uteri, unspecified: Secondary | ICD-10-CM | POA: Diagnosis not present

## 2017-09-11 DIAGNOSIS — Z510 Encounter for antineoplastic radiation therapy: Secondary | ICD-10-CM | POA: Diagnosis not present

## 2017-09-11 DIAGNOSIS — R918 Other nonspecific abnormal finding of lung field: Secondary | ICD-10-CM | POA: Diagnosis not present

## 2017-09-11 MED ORDER — LIDOCAINE-PRILOCAINE 2.5-2.5 % EX CREA: TOPICAL_CREAM | CUTANEOUS | 3 refills | Status: AC

## 2017-09-11 MED ORDER — ONDANSETRON HCL 8 MG PO TABS: 8.0000 mg | ORAL_TABLET | Freq: Two times a day (BID) | ORAL | 1 refills | Status: DC | PRN

## 2017-09-11 MED ORDER — PROCHLORPERAZINE MALEATE 10 MG PO TABS: 10.0000 mg | ORAL_TABLET | Freq: Four times a day (QID) | ORAL | 1 refills | Status: DC | PRN

## 2017-09-11 NOTE — Telephone Encounter (Signed)
09/11/2017 faxed most recent progress notes, assessment & plan, and labs to [facility] @ [phone]

## 2017-09-11 NOTE — Telephone Encounter (Signed)
Gave avs and calendar for December and January 2019 °

## 2017-09-12 ENCOUNTER — Encounter: Payer: Self-pay | Admitting: Hematology and Oncology

## 2017-09-12 ENCOUNTER — Ambulatory Visit
Admission: RE | Admit: 2017-09-12 | Discharge: 2017-09-12 | Disposition: A | Discharge status: Home / Self Care | Payer: Medicare HMO | Source: Ambulatory Visit | Attending: Radiation Oncology | Admitting: Radiation Oncology

## 2017-09-12 ENCOUNTER — Ambulatory Visit (HOSPITAL_BASED_OUTPATIENT_CLINIC_OR_DEPARTMENT_OTHER): Payer: Medicare HMO

## 2017-09-12 VITALS — BP 125/69 | HR 99 | Temp 97.4°F | Resp 16

## 2017-09-12 DIAGNOSIS — C539 Malignant neoplasm of cervix uteri, unspecified: Secondary | ICD-10-CM

## 2017-09-12 DIAGNOSIS — Z5111 Encounter for antineoplastic chemotherapy: Secondary | ICD-10-CM | POA: Diagnosis not present

## 2017-09-12 DIAGNOSIS — C541 Malignant neoplasm of endometrium: Secondary | ICD-10-CM | POA: Diagnosis not present

## 2017-09-12 DIAGNOSIS — C530 Malignant neoplasm of endocervix: Secondary | ICD-10-CM

## 2017-09-12 DIAGNOSIS — Z510 Encounter for antineoplastic radiation therapy: Secondary | ICD-10-CM | POA: Diagnosis not present

## 2017-09-12 MED ORDER — SODIUM CHLORIDE 0.9 % IV SOLN
Freq: Once | INTRAVENOUS | Status: AC
  Administered 2017-09-12: 09:00:00 via INTRAVENOUS

## 2017-09-12 MED ORDER — POTASSIUM CHLORIDE 2 MEQ/ML IV SOLN
Freq: Once | INTRAVENOUS | Status: AC
  Administered 2017-09-12: 09:00:00 via INTRAVENOUS
  Filled 2017-09-12: qty 10

## 2017-09-12 MED ORDER — PALONOSETRON HCL INJECTION 0.25 MG/5ML
0.2500 mg | Freq: Once | INTRAVENOUS | Status: AC
  Administered 2017-09-12: 0.25 mg via INTRAVENOUS

## 2017-09-12 MED ORDER — HEPARIN SOD (PORK) LOCK FLUSH 100 UNIT/ML IV SOLN
500.0000 [IU] | Freq: Once | INTRAVENOUS | Status: AC | PRN
  Administered 2017-09-12: 500 [IU]
  Filled 2017-09-12: qty 5

## 2017-09-12 MED ORDER — SODIUM CHLORIDE 0.9 % IV SOLN
Freq: Once | INTRAVENOUS | Status: AC
  Administered 2017-09-12: 11:00:00 via INTRAVENOUS
  Filled 2017-09-12: qty 5

## 2017-09-12 MED ORDER — SODIUM CHLORIDE 0.9% FLUSH
10.0000 mL | INTRAVENOUS | Status: DC | PRN
  Administered 2017-09-12: 10 mL
  Filled 2017-09-12: qty 10

## 2017-09-12 MED ORDER — SODIUM CHLORIDE 0.9 % IV SOLN
70.0000 mg | Freq: Once | INTRAVENOUS | Status: AC
  Administered 2017-09-12: 70 mg via INTRAVENOUS
  Filled 2017-09-12: qty 70

## 2017-09-12 MED ORDER — PALONOSETRON HCL INJECTION 0.25 MG/5ML
INTRAVENOUS | Status: AC
  Filled 2017-09-12: qty 5

## 2017-09-12 NOTE — Progress Notes (Signed)
Received PA request for Lidocaine/Prilocaine cream and Ondasetron.  Called [employer](Shana) to initiate PA's. Answered clinical questions and spoke with Cleo(clinical pharmacist) to give additional information.  Lidocaine/Prilocaine approved 10/14/16-12/13/17.  Ondansetron approved through 10/15/17.  Called [facility](Lilly) to advise of approvals. She states they went through.

## 2017-09-12 NOTE — Patient Instructions (Addendum)
[facility] Discharge Instructions for Patients Receiving Chemotherapy  Today you received the following chemotherapy agents: Cisplatin.   To help prevent nausea and vomiting after your treatment, we encourage you to take your nausea medication as directed.  If you develop nausea and vomiting that is not controlled by your nausea medication, call the clinic.   BELOW ARE SYMPTOMS THAT SHOULD BE REPORTED IMMEDIATELY:  *FEVER GREATER THAN 100.5 F  *CHILLS WITH OR WITHOUT FEVER  NAUSEA AND VOMITING THAT IS NOT CONTROLLED WITH YOUR NAUSEA MEDICATION  *UNUSUAL SHORTNESS OF BREATH  *UNUSUAL BRUISING OR BLEEDING  TENDERNESS IN MOUTH AND THROAT WITH OR WITHOUT PRESENCE OF ULCERS  *URINARY PROBLEMS  *BOWEL PROBLEMS  UNUSUAL RASH Items with * indicate a potential emergency and should be followed up as soon as possible.  Feel free to call the clinic should you have any questions or concerns. The clinic phone number is [phone].  Please show the CHEMO ALERT CARD at check-in to the Emergency Department and triage nurse.   Cisplatin injection What is this medicine? CISPLATIN (SIS pla tin) is a chemotherapy drug. It targets fast dividing cells, like cancer cells, and causes these cells to die. This medicine is used to treat many types of cancer like bladder, ovarian, and testicular cancers. This medicine may be used for other purposes; ask your health care provider or pharmacist if you have questions. COMMON BRAND NAME(S): Platinol, Platinol -AQ What should I tell my health care provider before I take this medicine? They need to know if you have any of these conditions: -blood disorders -hearing problems -kidney disease -recent or ongoing radiation therapy -an unusual or allergic reaction to cisplatin, carboplatin, other chemotherapy, other medicines, foods, dyes, or preservatives -pregnant or trying to get pregnant -breast-feeding How should I use this  medicine? This drug is given as an infusion into a vein. It is administered in a hospital or clinic by a specially trained health care professional. Talk to your pediatrician regarding the use of this medicine in children. Special care may be needed. Overdosage: If you think you have taken too much of this medicine contact a poison control center or emergency room at once. NOTE: This medicine is only for you. Do not share this medicine with others. What if I miss a dose? It is important not to miss a dose. Call your doctor or health care professional if you are unable to keep an appointment. What may interact with this medicine? -dofetilide -foscarnet -medicines for seizures -medicines to increase blood counts like filgrastim, pegfilgrastim, sargramostim -probenecid -pyridoxine used with altretamine -rituximab -some antibiotics like amikacin, gentamicin, neomycin, polymyxin B, streptomycin, tobramycin -sulfinpyrazone -vaccines -zalcitabine Talk to your doctor or health care professional before taking any of these medicines: -acetaminophen -aspirin -ibuprofen -ketoprofen -naproxen This list may not describe all possible interactions. Give your health care provider a list of all the medicines, herbs, non-prescription drugs, or dietary supplements you use. Also tell them if you smoke, drink alcohol, or use illegal drugs. Some items may interact with your medicine. What should I watch for while using this medicine? Your condition will be monitored carefully while you are receiving this medicine. You will need important blood work done while you are taking this medicine. This drug may make you feel generally unwell. This is not uncommon, as chemotherapy can affect healthy cells as well as cancer cells. Report any side effects. Continue your course of treatment even though you feel ill unless your doctor tells you to   stop. In some cases, you may be given additional medicines to help with side  effects. Follow all directions for their use. Call your doctor or health care professional for advice if you get a fever, chills or sore throat, or other symptoms of a cold or flu. Do not treat yourself. This drug decreases your body's ability to fight infections. Try to avoid being around people who are sick. This medicine may increase your risk to bruise or bleed. Call your doctor or health care professional if you notice any unusual bleeding. Be careful brushing and flossing your teeth or using a toothpick because you may get an infection or bleed more easily. If you have any dental work done, tell your dentist you are receiving this medicine. Avoid taking products that contain aspirin, acetaminophen, ibuprofen, naproxen, or ketoprofen unless instructed by your doctor. These medicines may hide a fever. Do not become pregnant while taking this medicine. Women should inform their doctor if they wish to become pregnant or think they might be pregnant. There is a potential for serious side effects to an unborn child. Talk to your health care professional or pharmacist for more information. Do not breast-feed an infant while taking this medicine. Drink fluids as directed while you are taking this medicine. This will help protect your kidneys. Call your doctor or health care professional if you get diarrhea. Do not treat yourself. What side effects may I notice from receiving this medicine? Side effects that you should report to your doctor or health care professional as soon as possible: -allergic reactions like skin rash, itching or hives, swelling of the face, lips, or tongue -signs of infection - fever or chills, cough, sore throat, pain or difficulty passing urine -signs of decreased platelets or bleeding - bruising, pinpoint red spots on the skin, black, tarry stools, nosebleeds -signs of decreased red blood cells - unusually weak or tired, fainting spells, lightheadedness -breathing  problems -changes in hearing -gout pain -low blood counts - This drug may decrease the number of white blood cells, red blood cells and platelets. You may be at increased risk for infections and bleeding. -nausea and vomiting -pain, swelling, redness or irritation at the injection site -pain, tingling, numbness in the hands or feet -problems with balance, movement -trouble passing urine or change in the amount of urine Side effects that usually do not require medical attention (report to your doctor or health care professional if they continue or are bothersome): -changes in vision -loss of appetite -metallic taste in the mouth or changes in taste This list may not describe all possible side effects. Call your doctor for medical advice about side effects. You may report side effects to FDA at 1-800-FDA-1088. Where should I keep my medicine? This drug is given in a hospital or clinic and will not be stored at home. NOTE: This sheet is a summary. It may not cover all possible information. If you have questions about this medicine, talk to your doctor, pharmacist, or health care provider.  2018 Elsevier/Gold Standard (2008-01-07 14:40:54)   

## 2017-09-12 NOTE — Progress Notes (Signed)
  Radiation Oncology         [phone]) [phone] ________________________________  Name: Maureen Vaughan MRN: [id number]  Date: 09/12/2017  DOB: Apr 21, 1951  Simulation Verification Note    ICD-10-CM   1. 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 The clinical setup was verified. Then port films were obtained and uploaded to the radiation oncology medical record software. The treatment beams were carefully compared against the planned radiation fields. The position location and shape of the radiation fields was reviewed. They targeted volume of tissue appears to be appropriately covered by the radiation beams. Organs at risk appear to be excluded as planned.  Based on my personal review, I approved the simulation verification. The patient's treatment will proceed as planned.  -----------------------------------  Blair Promise, PhD, MD    This document serves as a record of services personally performed by Gery Pray, MD. It was created on his behalf by Marlowe Kays, a trained medical scribe. The creation of this record is based on the scribe's personal observations and the provider's statements to them. This document has been checked and approved by the attending provider.

## 2017-09-13 ENCOUNTER — Ambulatory Visit
Admission: RE | Admit: 2017-09-13 | Discharge: 2017-09-13 | Disposition: A | Discharge status: Home / Self Care | Payer: Medicare HMO | Source: Ambulatory Visit | Attending: Radiation Oncology | Admitting: Radiation Oncology

## 2017-09-13 DIAGNOSIS — Z510 Encounter for antineoplastic radiation therapy: Secondary | ICD-10-CM | POA: Diagnosis not present

## 2017-09-14 ENCOUNTER — Ambulatory Visit
Admission: RE | Admit: 2017-09-14 | Discharge: 2017-09-14 | Disposition: A | Discharge status: Home / Self Care | Payer: Medicare HMO | Source: Ambulatory Visit | Attending: Radiation Oncology | Admitting: Radiation Oncology

## 2017-09-14 ENCOUNTER — Encounter: Payer: Self-pay | Admitting: Hematology and Oncology

## 2017-09-14 DIAGNOSIS — Z510 Encounter for antineoplastic radiation therapy: Secondary | ICD-10-CM | POA: Diagnosis not present

## 2017-09-14 NOTE — Assessment & Plan Note (Signed)
We discussed the role of chemotherapy. The intent is of curative intent.  We discussed some of the risks, benefits, side-effects of cisplatin  Some of the short term side-effects included, though not limited to, including weight loss, life threatening infections, risk of allergic reactions, need for transfusions of blood products, nausea, vomiting, change in bowel habits, loss of hair, admission to hospital for various reasons, and risks of death.   Long term side-effects are also discussed including risks of infertility, permanent damage to nerve function, hearing loss, chronic fatigue, kidney damage with possibility needing hemodialysis, and rare secondary malignancy including bone marrow disorders.  The patient is aware that the response rates discussed earlier is not guaranteed.  After a long discussion, patient made an informed decision to proceed with the prescribed plan of care.   Patient education material was dispensed. I will see her on a weekly basis for toxicity review

## 2017-09-14 NOTE — Assessment & Plan Note (Signed)
She has multiple indeterminate lung nodules The cause is unknown Malignancy cannot be excluded It is not possible to order lung biopsy To give her the benefit of the doubt, we will proceed with concurrent chemoradiation therapy with curative intent She would need repeat imaging study of the chest sooner than later after completion of treatment Currently, the patient is not symptomatic

## 2017-09-14 NOTE — Progress Notes (Signed)
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 Examination revealed abnormal cervix. Vaginal ultrasound showed a 10 cm uterus with an intramural fibroid, left ovary is slightly enlarged measuring 2.1 x 1.5 x 2.3 cm which is described as solid complex with a vascular flow. In addition the cervix is imaged showing a solid vascular mass measuring 2.7 x 2.1 x 2.2 cm. A sonohysterogram showed no abnormalities in the endometrium. Patient had a Pap smear showing high-grade squamous intraepithelial lesion with features suspicious for invasion as well as atypical glandular cells. 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      08/29/2017 PET scan    1. Cervical malignancy with maximum SUV 6.0, dominant cervical mass measuring 6.2 by 6.0 by 7.3 cm. 2. 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 3. There 12 scattered small pulmonary nodules measuring up to 5 mm in diameter. No hypermetabolic activity but these are below sensitive PET-CT size thresholds. These could be postinflammatory or less likely neoplastic, surveillance is recommended. 4. Hypodense thyroid nodules are not hypermetabolic which strongly favors benign etiology.      09/10/2017 Procedure    Placement of a subcutaneous port device.       INTERVAL HISTORY: Please see below for problem oriented charting. She returns for chemotherapy consent She is doing well She is not  symptomatic No cough, chest pain or shortness of breath Denies significant pelvic pain.  REVIEW OF SYSTEMS:   Constitutional: Denies fevers, chills or abnormal weight loss Eyes: Denies blurriness of vision Ears, nose, mouth, throat, and face: Denies mucositis or sore throat Respiratory: Denies cough, dyspnea or wheezes Cardiovascular: Denies palpitation, chest discomfort or lower extremity swelling Gastrointestinal:  Denies nausea, heartburn or change in bowel habits Skin: Denies abnormal skin rashes Lymphatics: Denies new lymphadenopathy or easy bruising Neurological:Denies numbness, tingling or new weaknesses Behavioral/Psych: Mood is stable, no new changes  All other systems were reviewed with the patient and are negative.  I have reviewed the past medical history, past surgical history, social history and family history with the patient and they are unchanged from previous note.  ALLERGIES:  has No Known Allergies.  MEDICATIONS:  Current Outpatient Medications  Medication Sig Dispense Refill  . lidocaine-prilocaine (EMLA) cream Apply to affected area once 30 g 3  . ondansetron (ZOFRAN) 8 MG tablet Take 1 tablet (8 mg total) by mouth 2 (two) times daily as needed. Start on the third day after chemotherapy. 30 tablet 1  . prochlorperazine (COMPAZINE) 10 MG tablet Take 1 tablet (10 mg total) by mouth every 6 (six) hours as needed (Nausea or vomiting). 30 tablet 1   No current facility-administered medications for this visit.     PHYSICAL EXAMINATION: ECOG PERFORMANCE STATUS: 0 - Asymptomatic  Vitals:   09/11/17 1300  BP: 136/61  Pulse: (!) 119  Resp: 18  Temp: 99 F (37.2 C)  SpO2: 100%   Filed Weights   09/11/17 1300  Weight: 165 lb 1.6 oz (74.9 kg)    GENERAL:alert,  no distress and comfortable SKIN: skin color, texture, turgor are normal, no rashes or significant lesions EYES: normal, Conjunctiva are pink and non-injected, sclera clear OROPHARYNX:no exudate, no  erythema and lips, buccal mucosa, and tongue normal  NECK: supple, thyroid normal size, non-tender, without nodularity LYMPH:  no palpable lymphadenopathy in the cervical, axillary or inguinal LUNGS: clear to auscultation and percussion with normal breathing effort HEART: regular rate & rhythm and no murmurs and no lower extremity edema ABDOMEN:abdomen soft, non-tender and normal bowel sounds Musculoskeletal:no cyanosis of digits and no clubbing  NEURO: alert & oriented x 3 with fluent speech, no focal motor/sensory deficits  LABORATORY DATA:  I have reviewed the data as listed    Component Value Date/Time   NA 141 09/10/2017 0834   NA 142 08/29/2017 1440   K 3.8 09/10/2017 0834   K 4.2 08/29/2017 1440   CL 104 09/10/2017 0834   CO2 27 09/10/2017 0834   CO2 27 08/29/2017 1440   GLUCOSE 97 09/10/2017 0834   GLUCOSE 86 08/29/2017 1440   BUN 8 09/10/2017 0834   BUN 9.6 08/29/2017 1440   CREATININE 1.02 (H) 09/10/2017 0834   CREATININE 1.0 08/29/2017 1440   CALCIUM 9.5 09/10/2017 0834   CALCIUM 10.0 08/29/2017 1440   PROT 7.9 08/29/2017 1440   ALBUMIN 4.2 08/29/2017 1440   AST 17 08/29/2017 1440   ALT 10 08/29/2017 1440   ALKPHOS 79 08/29/2017 1440   BILITOT 0.46 08/29/2017 1440   GFRNONAA 56 (L) 09/10/2017 0834   GFRAA >60 09/10/2017 0834    No results found for: SPEP, UPEP  Lab Results  Component Value Date   WBC 4.8 09/10/2017   NEUTROABS 2.6 08/29/2017   HGB 12.7 09/10/2017   HCT 38.4 09/10/2017   MCV 87.9 09/10/2017   PLT 263 09/10/2017      Chemistry      Component Value Date/Time   NA 141 09/10/2017 0834   NA 142 08/29/2017 1440   K 3.8 09/10/2017 0834   K 4.2 08/29/2017 1440   CL 104 09/10/2017 0834   CO2 27 09/10/2017 0834   CO2 27 08/29/2017 1440   BUN 8 09/10/2017 0834   BUN 9.6 08/29/2017 1440   CREATININE 1.02 (H) 09/10/2017 0834   CREATININE 1.0 08/29/2017 1440      Component Value Date/Time   CALCIUM 9.5 09/10/2017 0834   CALCIUM 10.0  08/29/2017 1440   ALKPHOS 79 08/29/2017 1440   AST 17 08/29/2017 1440   ALT 10 08/29/2017 1440   BILITOT 0.46 08/29/2017 1440       RADIOGRAPHIC STUDIES: I have personally reviewed the radiological images as listed and agreed with the findings in the report. Nm Pet Image Initial (pi) Skull Base To Thigh  Result Date: 08/29/2017 CLINICAL DATA:  Initial treatment strategy for cervical cancer. EXAM: NUCLEAR MEDICINE PET SKULL BASE TO THIGH TECHNIQUE: 8.3 mCi F-18 FDG was injected intravenously. Full-ring PET imaging was performed from the skull base to thigh after the radiotracer. CT data was obtained and used for attenuation correction and anatomic localization. FASTING BLOOD GLUCOSE:  Value: 104 mg/dl COMPARISON:  None. FINDINGS: NECK Hypodense right thyroid nodule 3.6 cm in diameter, without accentuated hypermetabolic activity. Smaller 1.3 cm isthmic hypodense nodule. Left thyroid nodule 0.9 cm in diameter. CHEST Twelve scattered small pulmonary nodules measuring up to 5 mm in diameter do not appear appreciably hypermetabolic but which are below sensitive PET-CT size thresholds. No hypermetabolic thoracic adenopathy. ABDOMEN/PELVIS The dominant cervical mass has a  maximum SUV of 16.0 with hypermetabolic activity measuring approximately 6.2 by 6.0 by 7.3 cm. A left external iliac lymph node adjacent to the pelvic sidewall measures 1.5 cm in short axis on image 162/4, with maximum SUV 10.0. No abnormal activity in the liver, spleen, pancreas, or adrenal glands. There are a few sigmoid colon diverticula. SKELETON No focal hypermetabolic activity to suggest skeletal metastasis. IMPRESSION: 1. Cervical malignancy with maximum SUV 6.0, dominant cervical mass measuring 6.2 by 6.0 by 7.3 cm. 2. 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 3. There 12 scattered small pulmonary nodules measuring up to 5 mm in diameter. No hypermetabolic activity but these are  below sensitive PET-CT size thresholds. These could be postinflammatory or less likely neoplastic, surveillance is recommended. 4. Hypodense thyroid nodules are not hypermetabolic which strongly favors benign etiology. Electronically Signed   By: Van Clines M.D.   On: 08/29/2017 13:19   Ir US Guide Vasc Access Right  Result Date: 09/10/2017 INDICATION: 66 year old with malignant neoplasm of cervix. Port-A-Cath needed for treatment. EXAM: FLUOROSCOPIC AND ULTRASOUND GUIDED PLACEMENT OF A SUBCUTANEOUS PORT COMPARISON:  None. MEDICATIONS: Ancef 2 g; The antibiotic was administered within an appropriate time interval prior to skin puncture. ANESTHESIA/SEDATION: Versed 1.0 mg IV; Fentanyl 50 mcg IV; Moderate Sedation Time:  30 minutes The patient was continuously monitored during the procedure by the interventional radiology nurse under my direct supervision. FLUOROSCOPY TIME:  18 seconds COMPLICATIONS: None immediate. PROCEDURE: The procedure, risks, benefits, and alternatives were explained to the patient. Questions regarding the procedure were encouraged and answered. The patient understands and consents to the procedure. Patient was placed supine on the interventional table. Ultrasound confirmed a patent right internal jugular vein. The right chest and neck were cleaned with a skin antiseptic and a sterile drape was placed. Maximal barrier sterile technique was utilized including caps, mask, sterile gowns, sterile gloves, sterile drape, hand hygiene and skin antiseptic. The right neck was anesthetized with 1% lidocaine. Small incision was made in the right neck with a blade. Micropuncture set was placed in the right internal jugular vein with ultrasound guidance. The micropuncture wire was used for measurement purposes. The right chest was anesthetized with 1% lidocaine. #15 blade was used to make an incision and a subcutaneous port pocket was formed. [place] was assembled. Subcutaneous  tunnel was formed with a stiff tunneling device. The port catheter was brought through the subcutaneous tunnel. The port was placed in the subcutaneous pocket. The micropuncture set was exchanged for a peel-away sheath. The catheter was placed through the peel-away sheath and the tip was positioned at the superior cavoatrial junction. Catheter placement was confirmed with fluoroscopy. The port was accessed and flushed with heparinized saline. The port pocket was closed using two layers of absorbable sutures and Dermabond. The vein skin site was closed using a single layer of absorbable suture and Dermabond. Sterile dressings were applied. Patient tolerated the procedure well without an immediate complication. Ultrasound and fluoroscopic images were taken and saved for this procedure. IMPRESSION: Placement of a subcutaneous port device. Electronically Signed   By: Markus Daft M.D.   On: 09/10/2017 11:32   Ir Fluoro Guide Port Insertion Right  Result Date: 09/10/2017 INDICATION: 66 year old with malignant neoplasm of cervix. Port-A-Cath needed for treatment. EXAM: FLUOROSCOPIC AND ULTRASOUND GUIDED PLACEMENT OF A SUBCUTANEOUS PORT COMPARISON:  None. MEDICATIONS: Ancef 2 g; The antibiotic was administered within an appropriate time interval prior to  skin puncture. ANESTHESIA/SEDATION: Versed 1.0 mg IV; Fentanyl 50 mcg IV; Moderate Sedation Time:  30 minutes The patient was continuously monitored during the procedure by the interventional radiology nurse under my direct supervision. FLUOROSCOPY TIME:  18 seconds COMPLICATIONS: None immediate. PROCEDURE: The procedure, risks, benefits, and alternatives were explained to the patient. Questions regarding the procedure were encouraged and answered. The patient understands and consents to the procedure. Patient was placed supine on the interventional table. Ultrasound confirmed a patent right internal jugular vein. The right chest and neck were cleaned with a skin  antiseptic and a sterile drape was placed. Maximal barrier sterile technique was utilized including caps, mask, sterile gowns, sterile gloves, sterile drape, hand hygiene and skin antiseptic. The right neck was anesthetized with 1% lidocaine. Small incision was made in the right neck with a blade. Micropuncture set was placed in the right internal jugular vein with ultrasound guidance. The micropuncture wire was used for measurement purposes. The right chest was anesthetized with 1% lidocaine. #15 blade was used to make an incision and a subcutaneous port pocket was formed. [place] was assembled. Subcutaneous tunnel was formed with a stiff tunneling device. The port catheter was brought through the subcutaneous tunnel. The port was placed in the subcutaneous pocket. The micropuncture set was exchanged for a peel-away sheath. The catheter was placed through the peel-away sheath and the tip was positioned at the superior cavoatrial junction. Catheter placement was confirmed with fluoroscopy. The port was accessed and flushed with heparinized saline. The port pocket was closed using two layers of absorbable sutures and Dermabond. The vein skin site was closed using a single layer of absorbable suture and Dermabond. Sterile dressings were applied. Patient tolerated the procedure well without an immediate complication. Ultrasound and fluoroscopic images were taken and saved for this procedure. IMPRESSION: Placement of a subcutaneous port device. Electronically Signed   By: Markus Daft M.D.   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 The intent is of curative intent.  We discussed some of the risks, benefits, side-effects of cisplatin  Some of the short term side-effects included, though not limited to, including weight loss, life threatening infections, risk of allergic reactions, need for transfusions of blood products,  nausea, vomiting, change in bowel habits, loss of hair, admission to hospital for various reasons, and risks of death.   Long term side-effects are also discussed including risks of infertility, permanent damage to nerve function, hearing loss, chronic fatigue, kidney damage with possibility needing hemodialysis, and rare secondary malignancy including bone marrow disorders.  The patient is aware that the response rates discussed earlier is not guaranteed.  After a long discussion, patient made an informed decision to proceed with the prescribed plan of care.   Patient education material was dispensed. I will see her on a weekly basis for toxicity review   Multiple lung nodules She has multiple indeterminate lung nodules The cause is unknown Malignancy cannot be excluded It is not possible to order lung biopsy To give her the benefit of the doubt, we will proceed with concurrent chemoradiation therapy with curative intent She would need repeat imaging study of the chest sooner than later after completion of treatment Currently, the patient is not symptomatic   No orders of the defined types were placed in this encounter.  All questions were answered. The patient knows to call the clinic with any problems, questions or concerns.  No barriers to learning was detected. I spent 15 minutes counseling the patient face to face. The total time spent in the appointment was 20 minutes and more than 50% was on counseling and review of test results     Heath Lark, MD 112/26/202018 3:47 PM

## 2017-09-17 ENCOUNTER — Ambulatory Visit
Admission: RE | Admit: 2017-09-17 | Discharge: 2017-09-17 | Disposition: A | Discharge status: Home / Self Care | Payer: Medicare HMO | Source: Ambulatory Visit | Attending: Radiation Oncology | Admitting: Radiation Oncology

## 2017-09-17 ENCOUNTER — Other Ambulatory Visit (HOSPITAL_BASED_OUTPATIENT_CLINIC_OR_DEPARTMENT_OTHER): Payer: Medicare HMO

## 2017-09-17 DIAGNOSIS — C539 Malignant neoplasm of cervix uteri, unspecified: Secondary | ICD-10-CM

## 2017-09-17 DIAGNOSIS — R918 Other nonspecific abnormal finding of lung field: Secondary | ICD-10-CM

## 2017-09-17 DIAGNOSIS — Z510 Encounter for antineoplastic radiation therapy: Secondary | ICD-10-CM | POA: Diagnosis not present

## 2017-09-17 LAB — COMPREHENSIVE METABOLIC PANEL
ALT: 13 U/L (ref 0–55)
AST: 17 U/L (ref 5–34)
Albumin: 3.8 g/dL (ref 3.5–5.0)
Alkaline Phosphatase: 79 U/L (ref 40–150)
Anion Gap: 9 mEq/L (ref 3–11)
BILIRUBIN TOTAL: 0.25 mg/dL (ref 0.20–1.20)
BUN: 17.1 mg/dL (ref 7.0–26.0)
CO2: 28 meq/L (ref 22–29)
CREATININE: 1.1 mg/dL (ref 0.6–1.1)
Calcium: 9.8 mg/dL (ref 8.4–10.4)
Chloride: 102 mEq/L (ref 98–109)
EGFR: 54 mL/min/{1.73_m2} — ABNORMAL LOW (ref >60)
GLUCOSE: 109 mg/dL (ref 70–140)
Potassium: 4.2 mEq/L (ref 3.5–5.1)
SODIUM: 139 meq/L (ref 136–145)
TOTAL PROTEIN: 7.5 g/dL (ref 6.4–8.3)

## 2017-09-17 LAB — CBC WITH DIFFERENTIAL/PLATELET
BASO%: 0.2 % (ref 0.0–2.0)
Basophils Absolute: 0 10*3/uL (ref 0.0–0.1)
EOS ABS: 0 10*3/uL (ref 0.0–0.5)
EOS%: 0.8 % (ref 0.0–7.0)
HCT: 38.8 % (ref 34.8–46.6)
HEMOGLOBIN: 12.7 g/dL (ref 11.6–15.9)
LYMPH%: 16.3 % (ref 14.0–49.7)
MCH: 28.6 pg (ref 25.1–34.0)
MCHC: 32.8 g/dL (ref 31.5–36.0)
MCV: 87.2 fL (ref 79.5–101.0)
MONO#: 0.3 10*3/uL (ref 0.1–0.9)
MONO%: 4.7 % (ref 0.0–14.0)
NEUT%: 78 % — ABNORMAL HIGH (ref 38.4–76.8)
NEUTROS ABS: 4.5 10*3/uL (ref 1.5–6.5)
PLATELETS: 243 10*3/uL (ref 145–400)
RBC: 4.45 10*6/uL (ref 3.70–5.45)
RDW: 12.8 % (ref 11.2–14.5)
WBC: 5.7 10*3/uL (ref 3.9–10.3)
lymph#: 0.9 10*3/uL (ref 0.9–3.3)

## 2017-09-17 LAB — MAGNESIUM: MAGNESIUM: 2.1 mg/dL (ref 1.5–2.5)

## 2017-09-18 ENCOUNTER — Encounter: Payer: Self-pay | Admitting: Hematology and Oncology

## 2017-09-18 ENCOUNTER — Ambulatory Visit (HOSPITAL_BASED_OUTPATIENT_CLINIC_OR_DEPARTMENT_OTHER): Payer: Medicare HMO | Admitting: Hematology and Oncology

## 2017-09-18 ENCOUNTER — Ambulatory Visit
Admission: RE | Admit: 2017-09-18 | Discharge: 2017-09-18 | Disposition: A | Discharge status: Home / Self Care | Payer: Medicare HMO | Source: Ambulatory Visit | Attending: Radiation Oncology | Admitting: Radiation Oncology

## 2017-09-18 DIAGNOSIS — K5909 Other constipation: Secondary | ICD-10-CM | POA: Diagnosis not present

## 2017-09-18 DIAGNOSIS — C539 Malignant neoplasm of cervix uteri, unspecified: Secondary | ICD-10-CM | POA: Diagnosis not present

## 2017-09-18 DIAGNOSIS — Z510 Encounter for antineoplastic radiation therapy: Secondary | ICD-10-CM | POA: Diagnosis not present

## 2017-09-18 DIAGNOSIS — R918 Other nonspecific abnormal finding of lung field: Secondary | ICD-10-CM

## 2017-09-18 DIAGNOSIS — C530 Malignant neoplasm of endocervix: Secondary | ICD-10-CM

## 2017-09-18 NOTE — Assessment & Plan Note (Signed)
She has mild constipation, likely induced by side effects of treatment I recommend laxative therapy as needed

## 2017-09-18 NOTE — Assessment & Plan Note (Signed)
So far, she tolerated treatment well without side effects I would continue to see her on a weekly basis for supportive care She felt that her vaginal bleeding is less

## 2017-09-18 NOTE — Progress Notes (Signed)
Pt here for patient teaching.  Pt given Radiation and You booklet and skin care instructions.  Reviewed areas of pertinence such as diarrhea, fatigue, nausea and vomiting, sexual and fertility changes, skin changes and urinary and bladder changes . Pt able to give teach back of have Imodium on hand and drink plenty of water,avoid applying anything to skin within 4 hours of treatment. Pt verbalizes understanding of information given and will contact nursing with any questions or concerns.     [email or web address]

## 2017-09-18 NOTE — Progress Notes (Signed)
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 Examination revealed abnormal cervix. Vaginal ultrasound showed a 10 cm uterus with an intramural fibroid, left ovary is slightly enlarged measuring 2.1 x 1.5 x 2.3 cm which is described as solid complex with a vascular flow. In addition the cervix is imaged showing a solid vascular mass measuring 2.7 x 2.1 x 2.2 cm. A sonohysterogram showed no abnormalities in the endometrium. Patient had a Pap smear showing high-grade squamous intraepithelial lesion with features suspicious for invasion as well as atypical glandular cells. 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      08/29/2017 PET scan    1. Cervical malignancy with maximum SUV 6.0, dominant cervical mass measuring 6.2 by 6.0 by 7.3 cm. 2. 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 3. There 12 scattered small pulmonary nodules measuring up to 5 mm in diameter. No hypermetabolic activity but these are below sensitive PET-CT size thresholds. These could be postinflammatory or less likely neoplastic, surveillance is recommended. 4. Hypodense thyroid nodules are not hypermetabolic which strongly favors benign etiology.      09/10/2017 Procedure    Placement of a subcutaneous port device.      09/12/2017 -  Chemotherapy    The patient had palonosetron (ALOXI) injection 0.25 mg, 0.25 mg, Intravenous,  Once, 1 of 5  cycles  Administration: 0.25 mg (09/12/2017)    CISplatin (PLATINOL) 70 mg in sodium chloride 0.9 % 250 mL chemo infusion, 67 mg (90 % of original dose 40 mg/m2), Intravenous,  Once, 1 of 5 cycles  Dose modification: 36 mg/m2 (90 % of original dose 40 mg/m2, Cycle 1, Reason: Other (see comments), Comment: cap at maximum 70 mg)  Administration: 70 mg (09/12/2017)    fosaprepitant (EMEND) 150 mg, dexamethasone (DECADRON) 12 mg in sodium chloride 0.9 % 145 mL IVPB, , Intravenous,  Once, 1 of 5 cycles  Administration:  (09/12/2017)  for chemotherapy treatment.  She received weekly cisplatin      09/12/2017 -  Radiation Therapy    She had radiation treatment       INTERVAL HISTORY: Please see below for problem oriented charting. She returns for further follow-up She tolerated chemotherapy well She denies mucositis, nausea, hearing loss of peripheral neuropathy She had minimal vaginal bleeding.  No pelvic pain. She complained of minor constipation recently.  REVIEW OF SYSTEMS:   Constitutional: Denies fevers, chills or abnormal weight loss Eyes: Denies blurriness of vision Ears, nose, mouth, throat, and face: Denies mucositis or sore throat Respiratory: Denies cough, dyspnea or wheezes Cardiovascular: Denies palpitation, chest discomfort or lower extremity swelling Skin: Denies abnormal skin rashes Lymphatics: Denies new lymphadenopathy or easy bruising Neurological:Denies numbness, tingling or new weaknesses Behavioral/Psych: Mood is stable, no new changes  All other systems were reviewed with the patient and are negative.  I have reviewed the past medical history, past surgical history, social history and family history with the patient and they are unchanged from previous note.  ALLERGIES:  has No  Known Allergies.  MEDICATIONS:  Current Outpatient Medications  Medication Sig Dispense Refill  . lidocaine-prilocaine (EMLA) cream Apply to affected area once 30 g 3  .  ondansetron (ZOFRAN) 8 MG tablet Take 1 tablet (8 mg total) by mouth 2 (two) times daily as needed. Start on the third day after chemotherapy. 30 tablet 1  . prochlorperazine (COMPAZINE) 10 MG tablet Take 1 tablet (10 mg total) by mouth every 6 (six) hours as needed (Nausea or vomiting). 30 tablet 1   No current facility-administered medications for this visit.     PHYSICAL EXAMINATION: ECOG PERFORMANCE STATUS: 1 - Symptomatic but completely ambulatory  Vitals:   09/18/17 1401  BP: 116/64  Pulse: 83  Resp: 18  Temp: 98.1 F (36.7 C)  SpO2: 100%   Filed Weights   09/18/17 1401  Weight: 160 lb 9.6 oz (72.8 kg)    GENERAL:alert, no distress and comfortable SKIN: skin color, texture, turgor are normal, no rashes or significant lesions EYES: normal, Conjunctiva are pink and non-injected, sclera clear OROPHARYNX:no exudate, no erythema and lips, buccal mucosa, and tongue normal  NECK: supple, thyroid normal size, non-tender, without nodularity LYMPH:  no palpable lymphadenopathy in the cervical, axillary or inguinal LUNGS: clear to auscultation and percussion with normal breathing effort HEART: regular rate & rhythm and no murmurs and no lower extremity edema ABDOMEN:abdomen soft, non-tender and normal bowel sounds Musculoskeletal:no cyanosis of digits and no clubbing  NEURO: alert & oriented x 3 with fluent speech, no focal motor/sensory deficits  LABORATORY DATA:  I have reviewed the data as listed    Component Value Date/Time   NA 139 09/17/2017 1602   K 4.2 09/17/2017 1602   CL 104 09/10/2017 0834   CO2 28 09/17/2017 1602   GLUCOSE 109 09/17/2017 1602   BUN 17.1 09/17/2017 1602   CREATININE 1.1 09/17/2017 1602   CALCIUM 9.8 09/17/2017 1602   PROT 7.5 09/17/2017 1602   ALBUMIN 3.8 09/17/2017 1602   AST 17 09/17/2017 1602   ALT 13 09/17/2017 1602   ALKPHOS 79 09/17/2017 1602   BILITOT 0.25 09/17/2017 1602   GFRNONAA 56 (L) 09/10/2017 0834   GFRAA >60 09/10/2017 0834     No results found for: SPEP, UPEP  Lab Results  Component Value Date   WBC 5.7 09/17/2017   NEUTROABS 4.5 09/17/2017   HGB 12.7 09/17/2017   HCT 38.8 09/17/2017   MCV 87.2 09/17/2017   PLT 243 09/17/2017      Chemistry      Component Value Date/Time   NA 139 09/17/2017 1602   K 4.2 09/17/2017 1602   CL 104 09/10/2017 0834   CO2 28 09/17/2017 1602   BUN 17.1 09/17/2017 1602   CREATININE 1.1 09/17/2017 1602      Component Value Date/Time   CALCIUM 9.8 09/17/2017 1602   ALKPHOS 79 09/17/2017 1602   AST 17 09/17/2017 1602   ALT 13 09/17/2017 1602   BILITOT 0.25 09/17/2017 1602       RADIOGRAPHIC STUDIES: I have personally reviewed the radiological images as listed and agreed with the findings in the report. Nm Pet Image Initial (pi) Skull Base To Thigh  Result Date: 08/29/2017 CLINICAL DATA:  Initial treatment strategy for cervical cancer. EXAM: NUCLEAR MEDICINE PET SKULL BASE TO THIGH TECHNIQUE: 8.3 mCi F-18 FDG was injected intravenously. Full-ring PET imaging was performed from the skull base to thigh after the radiotracer. CT data was obtained and used for attenuation correction and anatomic localization. FASTING BLOOD  GLUCOSE:  Value: 104 mg/dl COMPARISON:  None. FINDINGS: NECK Hypodense right thyroid nodule 3.6 cm in diameter, without accentuated hypermetabolic activity. Smaller 1.3 cm isthmic hypodense nodule. Left thyroid nodule 0.9 cm in diameter. CHEST Twelve scattered small pulmonary nodules measuring up to 5 mm in diameter do not appear appreciably hypermetabolic but which are below sensitive PET-CT size thresholds. No hypermetabolic thoracic adenopathy. ABDOMEN/PELVIS The dominant cervical mass has a maximum SUV of 16.0 with hypermetabolic activity measuring approximately 6.2 by 6.0 by 7.3 cm. A left external iliac lymph node adjacent to the pelvic sidewall measures 1.5 cm in short axis on image 162/4, with maximum SUV 10.0. No abnormal activity in the liver,  spleen, pancreas, or adrenal glands. There are a few sigmoid colon diverticula. SKELETON No focal hypermetabolic activity to suggest skeletal metastasis. IMPRESSION: 1. Cervical malignancy with maximum SUV 6.0, dominant cervical mass measuring 6.2 by 6.0 by 7.3 cm. 2. 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 3. There 12 scattered small pulmonary nodules measuring up to 5 mm in diameter. No hypermetabolic activity but these are below sensitive PET-CT size thresholds. These could be postinflammatory or less likely neoplastic, surveillance is recommended. 4. Hypodense thyroid nodules are not hypermetabolic which strongly favors benign etiology. Electronically Signed   By: Van Clines M.D.   On: 08/29/2017 13:19   Ir US Guide Vasc Access Right  Result Date: 09/10/2017 INDICATION: 66 year old with malignant neoplasm of cervix. Port-A-Cath needed for treatment. EXAM: FLUOROSCOPIC AND ULTRASOUND GUIDED PLACEMENT OF A SUBCUTANEOUS PORT COMPARISON:  None. MEDICATIONS: Ancef 2 g; The antibiotic was administered within an appropriate time interval prior to skin puncture. ANESTHESIA/SEDATION: Versed 1.0 mg IV; Fentanyl 50 mcg IV; Moderate Sedation Time:  30 minutes The patient was continuously monitored during the procedure by the interventional radiology nurse under my direct supervision. FLUOROSCOPY TIME:  18 seconds COMPLICATIONS: None immediate. PROCEDURE: The procedure, risks, benefits, and alternatives were explained to the patient. Questions regarding the procedure were encouraged and answered. The patient understands and consents to the procedure. Patient was placed supine on the interventional table. Ultrasound confirmed a patent right internal jugular vein. The right chest and neck were cleaned with a skin antiseptic and a sterile drape was placed. Maximal barrier sterile technique was utilized including caps, mask, sterile gowns, sterile gloves,  sterile drape, hand hygiene and skin antiseptic. The right neck was anesthetized with 1% lidocaine. Small incision was made in the right neck with a blade. Micropuncture set was placed in the right internal jugular vein with ultrasound guidance. The micropuncture wire was used for measurement purposes. The right chest was anesthetized with 1% lidocaine. #15 blade was used to make an incision and a subcutaneous port pocket was formed. [place] was assembled. Subcutaneous tunnel was formed with a stiff tunneling device. The port catheter was brought through the subcutaneous tunnel. The port was placed in the subcutaneous pocket. The micropuncture set was exchanged for a peel-away sheath. The catheter was placed through the peel-away sheath and the tip was positioned at the superior cavoatrial junction. Catheter placement was confirmed with fluoroscopy. The port was accessed and flushed with heparinized saline. The port pocket was closed using two layers of absorbable sutures and Dermabond. The vein skin site was closed using a single layer of absorbable suture and Dermabond. Sterile dressings were applied. Patient tolerated the procedure well without an immediate complication. Ultrasound and fluoroscopic images were taken and saved for this procedure.  IMPRESSION: Placement of a subcutaneous port device. Electronically Signed   By: Markus Daft M.D.   On: 09/10/2017 11:32   Ir Fluoro Guide Port Insertion Right  Result Date: 09/10/2017 INDICATION: 66 year old with malignant neoplasm of cervix. Port-A-Cath needed for treatment. EXAM: FLUOROSCOPIC AND ULTRASOUND GUIDED PLACEMENT OF A SUBCUTANEOUS PORT COMPARISON:  None. MEDICATIONS: Ancef 2 g; The antibiotic was administered within an appropriate time interval prior to skin puncture. ANESTHESIA/SEDATION: Versed 1.0 mg IV; Fentanyl 50 mcg IV; Moderate Sedation Time:  30 minutes The patient was continuously monitored during the procedure by the interventional  radiology nurse under my direct supervision. FLUOROSCOPY TIME:  18 seconds COMPLICATIONS: None immediate. PROCEDURE: The procedure, risks, benefits, and alternatives were explained to the patient. Questions regarding the procedure were encouraged and answered. The patient understands and consents to the procedure. Patient was placed supine on the interventional table. Ultrasound confirmed a patent right internal jugular vein. The right chest and neck were cleaned with a skin antiseptic and a sterile drape was placed. Maximal barrier sterile technique was utilized including caps, mask, sterile gowns, sterile gloves, sterile drape, hand hygiene and skin antiseptic. The right neck was anesthetized with 1% lidocaine. Small incision was made in the right neck with a blade. Micropuncture set was placed in the right internal jugular vein with ultrasound guidance. The micropuncture wire was used for measurement purposes. The right chest was anesthetized with 1% lidocaine. #15 blade was used to make an incision and a subcutaneous port pocket was formed. [place] was assembled. Subcutaneous tunnel was formed with a stiff tunneling device. The port catheter was brought through the subcutaneous tunnel. The port was placed in the subcutaneous pocket. The micropuncture set was exchanged for a peel-away sheath. The catheter was placed through the peel-away sheath and the tip was positioned at the superior cavoatrial junction. Catheter placement was confirmed with fluoroscopy. The port was accessed and flushed with heparinized saline. The port pocket was closed using two layers of absorbable sutures and Dermabond. The vein skin site was closed using a single layer of absorbable suture and Dermabond. Sterile dressings were applied. Patient tolerated the procedure well without an immediate complication. Ultrasound and fluoroscopic images were taken and saved for this procedure. IMPRESSION: Placement of a subcutaneous port  device. Electronically Signed   By: Markus Daft M.D.   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[place]) So far, she tolerated treatment well without side effects I would continue to see her on a weekly basis for supportive care She felt that her vaginal bleeding is less  Multiple lung nodules She has multiple indeterminate lung nodules The cause is unknown Malignancy cannot be excluded It is not possible to order lung biopsy To give her the benefit of the doubt, we will proceed with concurrent chemoradiation therapy with curative intent She would need repeat imaging study of the chest sooner than later after completion of treatment Currently, the patient is not symptomatic  Other constipation She has mild constipation, likely induced by side effects of treatment I recommend laxative therapy as needed   No orders of the defined types were placed in this encounter.  All questions were answered. The patient knows to call the clinic with any problems, questions or concerns. No barriers to learning was detected. I spent 15 minutes counseling the patient face to face. The total time spent in the appointment was 20 minutes and more than 50% was on counseling and  review of test results     Heath Lark, MD 09/18/2017 4:16 PM

## 2017-09-18 NOTE — Assessment & Plan Note (Signed)
She has multiple indeterminate lung nodules The cause is unknown Malignancy cannot be excluded It is not possible to order lung biopsy To give her the benefit of the doubt, we will proceed with concurrent chemoradiation therapy with curative intent She would need repeat imaging study of the chest sooner than later after completion of treatment Currently, the patient is not symptomatic

## 2017-09-19 ENCOUNTER — Ambulatory Visit (HOSPITAL_BASED_OUTPATIENT_CLINIC_OR_DEPARTMENT_OTHER): Payer: Medicare HMO

## 2017-09-19 ENCOUNTER — Ambulatory Visit
Admission: RE | Admit: 2017-09-19 | Discharge: 2017-09-19 | Disposition: A | Discharge status: Home / Self Care | Payer: Medicare HMO | Source: Ambulatory Visit | Attending: Radiation Oncology | Admitting: Radiation Oncology

## 2017-09-19 VITALS — BP 134/64 | HR 87 | Temp 98.4°F | Resp 18

## 2017-09-19 DIAGNOSIS — Z5111 Encounter for antineoplastic chemotherapy: Secondary | ICD-10-CM

## 2017-09-19 DIAGNOSIS — C539 Malignant neoplasm of cervix uteri, unspecified: Secondary | ICD-10-CM | POA: Diagnosis not present

## 2017-09-19 DIAGNOSIS — Z510 Encounter for antineoplastic radiation therapy: Secondary | ICD-10-CM | POA: Diagnosis not present

## 2017-09-19 MED ORDER — PALONOSETRON HCL INJECTION 0.25 MG/5ML
INTRAVENOUS | Status: AC
  Filled 2017-09-19: qty 5

## 2017-09-19 MED ORDER — SODIUM CHLORIDE 0.9 % IV SOLN
38.0000 mg/m2 | Freq: Once | INTRAVENOUS | Status: AC
  Administered 2017-09-19: 70 mg via INTRAVENOUS
  Filled 2017-09-19: qty 70

## 2017-09-19 MED ORDER — SODIUM CHLORIDE 0.9 % IV SOLN
Freq: Once | INTRAVENOUS | Status: AC
  Administered 2017-09-19: 11:00:00 via INTRAVENOUS
  Filled 2017-09-19: qty 5

## 2017-09-19 MED ORDER — SODIUM CHLORIDE 0.9% FLUSH
10.0000 mL | INTRAVENOUS | Status: DC | PRN
  Administered 2017-09-19: 10 mL
  Filled 2017-09-19: qty 10

## 2017-09-19 MED ORDER — PALONOSETRON HCL INJECTION 0.25 MG/5ML
0.2500 mg | Freq: Once | INTRAVENOUS | Status: AC
  Administered 2017-09-19: 0.25 mg via INTRAVENOUS

## 2017-09-19 MED ORDER — SODIUM CHLORIDE 0.9 % IV SOLN
Freq: Once | INTRAVENOUS | Status: AC
  Administered 2017-09-19: 08:00:00 via INTRAVENOUS

## 2017-09-19 MED ORDER — POTASSIUM CHLORIDE 2 MEQ/ML IV SOLN
Freq: Once | INTRAVENOUS | Status: AC
  Administered 2017-09-19: 09:00:00 via INTRAVENOUS
  Filled 2017-09-19: qty 10

## 2017-09-19 MED ORDER — HEPARIN SOD (PORK) LOCK FLUSH 100 UNIT/ML IV SOLN
500.0000 [IU] | Freq: Once | INTRAVENOUS | Status: AC | PRN
  Administered 2017-09-19: 500 [IU]
  Filled 2017-09-19: qty 5

## 2017-09-19 NOTE — Patient Instructions (Signed)
[facility] Discharge Instructions for Patients Receiving Chemotherapy  Today you received the following chemotherapy agents Cisplatin  To help prevent nausea and vomiting after your treatment, we encourage you to take your nausea medication as directed  If you develop nausea and vomiting that is not controlled by your nausea medication, call the clinic.   BELOW ARE SYMPTOMS THAT SHOULD BE REPORTED IMMEDIATELY:  *FEVER GREATER THAN 100.5 F  *CHILLS WITH OR WITHOUT FEVER  NAUSEA AND VOMITING THAT IS NOT CONTROLLED WITH YOUR NAUSEA MEDICATION  *UNUSUAL SHORTNESS OF BREATH  *UNUSUAL BRUISING OR BLEEDING  TENDERNESS IN MOUTH AND THROAT WITH OR WITHOUT PRESENCE OF ULCERS  *URINARY PROBLEMS  *BOWEL PROBLEMS  UNUSUAL RASH Items with * indicate a potential emergency and should be followed up as soon as possible.  Feel free to call the clinic should you have any questions or concerns. The clinic phone number is [phone].  Please show the CHEMO ALERT CARD at check-in to the Emergency Department and triage nurse.   

## 2017-09-20 ENCOUNTER — Ambulatory Visit
Admission: RE | Admit: 2017-09-20 | Discharge: 2017-09-20 | Disposition: A | Discharge status: Home / Self Care | Payer: Medicare HMO | Source: Ambulatory Visit | Attending: Radiation Oncology | Admitting: Radiation Oncology

## 2017-09-20 DIAGNOSIS — Z510 Encounter for antineoplastic radiation therapy: Secondary | ICD-10-CM | POA: Diagnosis not present

## 2017-09-21 ENCOUNTER — Ambulatory Visit
Admission: RE | Admit: 2017-09-21 | Discharge: 2017-09-21 | Disposition: A | Discharge status: Home / Self Care | Payer: Medicare HMO | Source: Ambulatory Visit | Attending: Radiation Oncology | Admitting: Radiation Oncology

## 2017-09-21 DIAGNOSIS — Z510 Encounter for antineoplastic radiation therapy: Secondary | ICD-10-CM | POA: Diagnosis not present

## 2017-09-24 ENCOUNTER — Other Ambulatory Visit: Payer: Medicare HMO

## 2017-09-24 ENCOUNTER — Ambulatory Visit: Payer: Medicare HMO

## 2017-09-25 ENCOUNTER — Ambulatory Visit (HOSPITAL_BASED_OUTPATIENT_CLINIC_OR_DEPARTMENT_OTHER): Payer: Medicare HMO

## 2017-09-25 ENCOUNTER — Ambulatory Visit
Admission: RE | Admit: 2017-09-25 | Discharge: 2017-09-25 | Disposition: A | Discharge status: Home / Self Care | Payer: Medicare HMO | Source: Ambulatory Visit | Attending: Radiation Oncology | Admitting: Radiation Oncology

## 2017-09-25 ENCOUNTER — Encounter: Payer: Self-pay | Admitting: Hematology and Oncology

## 2017-09-25 ENCOUNTER — Ambulatory Visit (HOSPITAL_BASED_OUTPATIENT_CLINIC_OR_DEPARTMENT_OTHER): Payer: Medicare HMO | Admitting: Hematology and Oncology

## 2017-09-25 DIAGNOSIS — Z510 Encounter for antineoplastic radiation therapy: Secondary | ICD-10-CM | POA: Diagnosis not present

## 2017-09-25 DIAGNOSIS — R918 Other nonspecific abnormal finding of lung field: Secondary | ICD-10-CM

## 2017-09-25 DIAGNOSIS — R197 Diarrhea, unspecified: Secondary | ICD-10-CM | POA: Diagnosis not present

## 2017-09-25 DIAGNOSIS — R630 Anorexia: Secondary | ICD-10-CM | POA: Insufficient documentation

## 2017-09-25 DIAGNOSIS — C530 Malignant neoplasm of endocervix: Secondary | ICD-10-CM

## 2017-09-25 DIAGNOSIS — C539 Malignant neoplasm of cervix uteri, unspecified: Secondary | ICD-10-CM

## 2017-09-25 LAB — CBC WITH DIFFERENTIAL/PLATELET
BASO%: 0.2 % (ref 0.0–2.0)
BASOS ABS: 0 10*3/uL (ref 0.0–0.1)
EOS ABS: 0.1 10*3/uL (ref 0.0–0.5)
EOS%: 1.7 % (ref 0.0–7.0)
HEMATOCRIT: 40.2 % (ref 34.8–46.6)
HEMOGLOBIN: 13.1 g/dL (ref 11.6–15.9)
LYMPH#: 0.3 10*3/uL — AB (ref 0.9–3.3)
LYMPH%: 6.8 % — ABNORMAL LOW (ref 14.0–49.7)
MCH: 28.2 pg (ref 25.1–34.0)
MCHC: 32.6 g/dL (ref 31.5–36.0)
MCV: 86.6 fL (ref 79.5–101.0)
MONO#: 0.3 10*3/uL (ref 0.1–0.9)
MONO%: 6.8 % (ref 0.0–14.0)
NEUT%: 84.5 % — ABNORMAL HIGH (ref 38.4–76.8)
NEUTROS ABS: 3.9 10*3/uL (ref 1.5–6.5)
PLATELETS: 177 10*3/uL (ref 145–400)
RBC: 4.64 10*6/uL (ref 3.70–5.45)
RDW: 12.3 % (ref 11.2–14.5)
WBC: 4.6 10*3/uL (ref 3.9–10.3)

## 2017-09-25 LAB — COMPREHENSIVE METABOLIC PANEL
ALT: 40 U/L (ref 0–55)
ANION GAP: 11 meq/L (ref 3–11)
AST: 33 U/L (ref 5–34)
Albumin: 4 g/dL (ref 3.5–5.0)
Alkaline Phosphatase: 86 U/L (ref 40–150)
BUN: 10.4 mg/dL (ref 7.0–26.0)
CALCIUM: 9.9 mg/dL (ref 8.4–10.4)
CHLORIDE: 101 meq/L (ref 98–109)
CO2: 25 meq/L (ref 22–29)
CREATININE: 1 mg/dL (ref 0.6–1.1)
EGFR: 58 mL/min/{1.73_m2} — ABNORMAL LOW (ref >60)
Glucose: 96 mg/dl (ref 70–140)
POTASSIUM: 4.5 meq/L (ref 3.5–5.1)
Sodium: 138 mEq/L (ref 136–145)
Total Bilirubin: 0.39 mg/dL (ref 0.20–1.20)
Total Protein: 8 g/dL (ref 6.4–8.3)

## 2017-09-25 LAB — MAGNESIUM: MAGNESIUM: 2 mg/dL (ref 1.5–2.5)

## 2017-09-25 NOTE — Assessment & Plan Note (Signed)
She has poor appetite We discussed trial of Remeron but the patient declined Encouraged her to increase oral intake as tolerated

## 2017-09-25 NOTE — Assessment & Plan Note (Signed)
She has mild diarrhea, likely secondary to radiation She will continue conservative management Encouraged her to increase oral fluid as tolerated

## 2017-09-25 NOTE — Progress Notes (Signed)
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 Examination revealed abnormal cervix. Vaginal ultrasound showed a 10 cm uterus with an intramural fibroid, left ovary is slightly enlarged measuring 2.1 x 1.5 x 2.3 cm which is described as solid complex with a vascular flow. In addition the cervix is imaged showing a solid vascular mass measuring 2.7 x 2.1 x 2.2 cm. A sonohysterogram showed no abnormalities in the endometrium. Patient had a Pap smear showing high-grade squamous intraepithelial lesion with features suspicious for invasion as well as atypical glandular cells. 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      08/29/2017 PET scan    1. Cervical malignancy with maximum SUV 6.0, dominant cervical mass measuring 6.2 by 6.0 by 7.3 cm. 2. 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 3. There 12 scattered small pulmonary nodules measuring up to 5 mm in diameter. No hypermetabolic activity but these are below sensitive PET-CT size thresholds. These could be postinflammatory or less likely neoplastic, surveillance is recommended. 4. Hypodense thyroid nodules are not hypermetabolic which strongly favors benign etiology.      09/10/2017 Procedure    Placement of a subcutaneous port device.      09/12/2017 -  Chemotherapy    The patient had palonosetron (ALOXI) injection 0.25 mg, 0.25 mg, Intravenous,  Once, 1 of 5  cycles  Administration: 0.25 mg (09/12/2017)    CISplatin (PLATINOL) 70 mg in sodium chloride 0.9 % 250 mL chemo infusion, 67 mg (90 % of original dose 40 mg/m2), Intravenous,  Once, 1 of 5 cycles  Dose modification: 36 mg/m2 (90 % of original dose 40 mg/m2, Cycle 1, Reason: Other (see comments), Comment: cap at maximum 70 mg)  Administration: 70 mg (09/12/2017)    fosaprepitant (EMEND) 150 mg, dexamethasone (DECADRON) 12 mg in sodium chloride 0.9 % 145 mL IVPB, , Intravenous,  Once, 1 of 5 cycles  Administration:  (09/12/2017)  for chemotherapy treatment.  She received weekly cisplatin      09/12/2017 -  Radiation Therapy    She had radiation treatment       INTERVAL HISTORY: Please see below for problem oriented charting. She is seen prior to cycle 3 of treatment She denies nausea, vomiting, peripheral neuropathy or hearing deficit She has poor appetite overall Vaginal bleeding is less She had mild loose stools  REVIEW OF SYSTEMS:   Constitutional: Denies fevers, chills or abnormal weight loss Eyes: Denies blurriness of vision Ears, nose, mouth, throat, and face: Denies mucositis or sore throat Respiratory: Denies cough, dyspnea or wheezes Cardiovascular: Denies palpitation, chest discomfort or lower extremity swelling Skin: Denies abnormal skin rashes Lymphatics: Denies new lymphadenopathy or easy bruising Neurological:Denies numbness, tingling or new weaknesses Behavioral/Psych: Mood is stable, no new changes  All other systems were reviewed with the patient and are negative.  I have reviewed the past medical history, past surgical history, social history and family history with the patient and they are unchanged from previous note.  ALLERGIES:  has No Known  Allergies.  MEDICATIONS:  Current Outpatient Medications  Medication Sig Dispense Refill  . lidocaine-prilocaine (EMLA) cream Apply to affected area once 30 g 3  . ondansetron (ZOFRAN) 8 MG tablet Take 1  tablet (8 mg total) by mouth 2 (two) times daily as needed. Start on the third day after chemotherapy. 30 tablet 1  . prochlorperazine (COMPAZINE) 10 MG tablet Take 1 tablet (10 mg total) by mouth every 6 (six) hours as needed (Nausea or vomiting). 30 tablet 1   No current facility-administered medications for this visit.     PHYSICAL EXAMINATION: ECOG PERFORMANCE STATUS: 1 - Symptomatic but completely ambulatory  Vitals:   09/25/17 1259  BP: (!) 144/82  Pulse: (!) 118  Resp: 20  Temp: 98.5 F (36.9 C)  SpO2: 100%   Filed Weights   09/25/17 1259  Weight: 158 lb 11.2 oz (72 kg)    GENERAL:alert, no distress and comfortable SKIN: skin color, texture, turgor are normal, no rashes or significant lesions EYES: normal, Conjunctiva are pink and non-injected, sclera clear OROPHARYNX:no exudate, no erythema and lips, buccal mucosa, and tongue normal  NECK: supple, thyroid normal size, non-tender, without nodularity LYMPH:  no palpable lymphadenopathy in the cervical, axillary or inguinal LUNGS: clear to auscultation and percussion with normal breathing effort HEART: regular rate & rhythm and no murmurs and no lower extremity edema ABDOMEN:abdomen soft, non-tender and normal bowel sounds Musculoskeletal:no cyanosis of digits and no clubbing  NEURO: alert & oriented x 3 with fluent speech, no focal motor/sensory deficits  LABORATORY DATA:  I have reviewed the data as listed    Component Value Date/Time   NA 139 09/17/2017 1602   K 4.2 09/17/2017 1602   CL 104 09/10/2017 0834   CO2 28 09/17/2017 1602   GLUCOSE 109 09/17/2017 1602   BUN 17.1 09/17/2017 1602   CREATININE 1.1 09/17/2017 1602   CALCIUM 9.8 09/17/2017 1602   PROT 7.5 09/17/2017 1602   ALBUMIN 3.8 09/17/2017 1602   AST 17 09/17/2017 1602   ALT 13 09/17/2017 1602   ALKPHOS 79 09/17/2017 1602   BILITOT 0.25 09/17/2017 1602   GFRNONAA 56 (L) 09/10/2017 0834   GFRAA >60 09/10/2017 0834    No results found for:  SPEP, UPEP  Lab Results  Component Value Date   WBC 5.7 09/17/2017   NEUTROABS 4.5 09/17/2017   HGB 12.7 09/17/2017   HCT 38.8 09/17/2017   MCV 87.2 09/17/2017   PLT 243 09/17/2017      Chemistry      Component Value Date/Time   NA 139 09/17/2017 1602   K 4.2 09/17/2017 1602   CL 104 09/10/2017 0834   CO2 28 09/17/2017 1602   BUN 17.1 09/17/2017 1602   CREATININE 1.1 09/17/2017 1602      Component Value Date/Time   CALCIUM 9.8 09/17/2017 1602   ALKPHOS 79 09/17/2017 1602   AST 17 09/17/2017 1602   ALT 13 09/17/2017 1602   BILITOT 0.25 09/17/2017 1602       RADIOGRAPHIC STUDIES: I have personally reviewed the radiological images as listed and agreed with the findings in the report. Nm Pet Image Initial (pi) Skull Base To Thigh  Result Date: 08/29/2017 CLINICAL DATA:  Initial treatment strategy for cervical cancer. EXAM: NUCLEAR MEDICINE PET SKULL BASE TO THIGH TECHNIQUE: 8.3 mCi F-18 FDG was injected intravenously. Full-ring PET imaging was performed from the skull base to thigh after the radiotracer. CT data was obtained and used for attenuation correction and anatomic localization. FASTING  BLOOD GLUCOSE:  Value: 104 mg/dl COMPARISON:  None. FINDINGS: NECK Hypodense right thyroid nodule 3.6 cm in diameter, without accentuated hypermetabolic activity. Smaller 1.3 cm isthmic hypodense nodule. Left thyroid nodule 0.9 cm in diameter. CHEST Twelve scattered small pulmonary nodules measuring up to 5 mm in diameter do not appear appreciably hypermetabolic but which are below sensitive PET-CT size thresholds. No hypermetabolic thoracic adenopathy. ABDOMEN/PELVIS The dominant cervical mass has a maximum SUV of 16.0 with hypermetabolic activity measuring approximately 6.2 by 6.0 by 7.3 cm. A left external iliac lymph node adjacent to the pelvic sidewall measures 1.5 cm in short axis on image 162/4, with maximum SUV 10.0. No abnormal activity in the liver, spleen, pancreas, or adrenal  glands. There are a few sigmoid colon diverticula. SKELETON No focal hypermetabolic activity to suggest skeletal metastasis. IMPRESSION: 1. Cervical malignancy with maximum SUV 6.0, dominant cervical mass measuring 6.2 by 6.0 by 7.3 cm. 2. 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 3. There 12 scattered small pulmonary nodules measuring up to 5 mm in diameter. No hypermetabolic activity but these are below sensitive PET-CT size thresholds. These could be postinflammatory or less likely neoplastic, surveillance is recommended. 4. Hypodense thyroid nodules are not hypermetabolic which strongly favors benign etiology. Electronically Signed   By: Van Clines M.D.   On: 08/29/2017 13:19   Ir US Guide Vasc Access Right  Result Date: 09/10/2017 INDICATION: 66 year old with malignant neoplasm of cervix. Port-A-Cath needed for treatment. EXAM: FLUOROSCOPIC AND ULTRASOUND GUIDED PLACEMENT OF A SUBCUTANEOUS PORT COMPARISON:  None. MEDICATIONS: Ancef 2 g; The antibiotic was administered within an appropriate time interval prior to skin puncture. ANESTHESIA/SEDATION: Versed 1.0 mg IV; Fentanyl 50 mcg IV; Moderate Sedation Time:  30 minutes The patient was continuously monitored during the procedure by the interventional radiology nurse under my direct supervision. FLUOROSCOPY TIME:  18 seconds COMPLICATIONS: None immediate. PROCEDURE: The procedure, risks, benefits, and alternatives were explained to the patient. Questions regarding the procedure were encouraged and answered. The patient understands and consents to the procedure. Patient was placed supine on the interventional table. Ultrasound confirmed a patent right internal jugular vein. The right chest and neck were cleaned with a skin antiseptic and a sterile drape was placed. Maximal barrier sterile technique was utilized including caps, mask, sterile gowns, sterile gloves, sterile drape, hand hygiene and  skin antiseptic. The right neck was anesthetized with 1% lidocaine. Small incision was made in the right neck with a blade. Micropuncture set was placed in the right internal jugular vein with ultrasound guidance. The micropuncture wire was used for measurement purposes. The right chest was anesthetized with 1% lidocaine. #15 blade was used to make an incision and a subcutaneous port pocket was formed. [place] was assembled. Subcutaneous tunnel was formed with a stiff tunneling device. The port catheter was brought through the subcutaneous tunnel. The port was placed in the subcutaneous pocket. The micropuncture set was exchanged for a peel-away sheath. The catheter was placed through the peel-away sheath and the tip was positioned at the superior cavoatrial junction. Catheter placement was confirmed with fluoroscopy. The port was accessed and flushed with heparinized saline. The port pocket was closed using two layers of absorbable sutures and Dermabond. The vein skin site was closed using a single layer of absorbable suture and Dermabond. Sterile dressings were applied. Patient tolerated the procedure well without an immediate complication. Ultrasound and fluoroscopic images were taken and saved for this  procedure. IMPRESSION: Placement of a subcutaneous port device. Electronically Signed   By: Markus Daft M.D.   On: 09/10/2017 11:32   Ir Fluoro Guide Port Insertion Right  Result Date: 09/10/2017 INDICATION: 66 year old with malignant neoplasm of cervix. Port-A-Cath needed for treatment. EXAM: FLUOROSCOPIC AND ULTRASOUND GUIDED PLACEMENT OF A SUBCUTANEOUS PORT COMPARISON:  None. MEDICATIONS: Ancef 2 g; The antibiotic was administered within an appropriate time interval prior to skin puncture. ANESTHESIA/SEDATION: Versed 1.0 mg IV; Fentanyl 50 mcg IV; Moderate Sedation Time:  30 minutes The patient was continuously monitored during the procedure by the interventional radiology nurse under my direct  supervision. FLUOROSCOPY TIME:  18 seconds COMPLICATIONS: None immediate. PROCEDURE: The procedure, risks, benefits, and alternatives were explained to the patient. Questions regarding the procedure were encouraged and answered. The patient understands and consents to the procedure. Patient was placed supine on the interventional table. Ultrasound confirmed a patent right internal jugular vein. The right chest and neck were cleaned with a skin antiseptic and a sterile drape was placed. Maximal barrier sterile technique was utilized including caps, mask, sterile gowns, sterile gloves, sterile drape, hand hygiene and skin antiseptic. The right neck was anesthetized with 1% lidocaine. Small incision was made in the right neck with a blade. Micropuncture set was placed in the right internal jugular vein with ultrasound guidance. The micropuncture wire was used for measurement purposes. The right chest was anesthetized with 1% lidocaine. #15 blade was used to make an incision and a subcutaneous port pocket was formed. [place] was assembled. Subcutaneous tunnel was formed with a stiff tunneling device. The port catheter was brought through the subcutaneous tunnel. The port was placed in the subcutaneous pocket. The micropuncture set was exchanged for a peel-away sheath. The catheter was placed through the peel-away sheath and the tip was positioned at the superior cavoatrial junction. Catheter placement was confirmed with fluoroscopy. The port was accessed and flushed with heparinized saline. The port pocket was closed using two layers of absorbable sutures and Dermabond. The vein skin site was closed using a single layer of absorbable suture and Dermabond. Sterile dressings were applied. Patient tolerated the procedure well without an immediate complication. Ultrasound and fluoroscopic images were taken and saved for this procedure. IMPRESSION: Placement of a subcutaneous port device. Electronically Signed    By: Markus Daft M.D.   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  All questions were answered. The patient knows to call the clinic with any problems, questions or concerns. No barriers to learning was detected. I spent 15 minutes counseling the patient face to face. The total time spent in the appointment was 20 minutes and more than 50% was on counseling and review of test results     Heath Lark, MD 09/25/2017 1:17 PM

## 2017-09-25 NOTE — Assessment & Plan Note (Signed)
So far, she tolerated treatment well without side effects I would continue to see her on a weekly basis for supportive care She felt that her vaginal bleeding is less

## 2017-09-26 ENCOUNTER — Ambulatory Visit
Admission: RE | Admit: 2017-09-26 | Discharge: 2017-09-26 | Disposition: A | Discharge status: Home / Self Care | Payer: Medicare HMO | Source: Ambulatory Visit | Attending: Radiation Oncology | Admitting: Radiation Oncology

## 2017-09-26 ENCOUNTER — Ambulatory Visit (HOSPITAL_BASED_OUTPATIENT_CLINIC_OR_DEPARTMENT_OTHER): Payer: Medicare HMO

## 2017-09-26 VITALS — BP 134/74 | HR 90 | Temp 98.8°F | Resp 17

## 2017-09-26 DIAGNOSIS — Z5111 Encounter for antineoplastic chemotherapy: Secondary | ICD-10-CM

## 2017-09-26 DIAGNOSIS — C539 Malignant neoplasm of cervix uteri, unspecified: Secondary | ICD-10-CM

## 2017-09-26 DIAGNOSIS — Z510 Encounter for antineoplastic radiation therapy: Secondary | ICD-10-CM | POA: Diagnosis not present

## 2017-09-26 MED ORDER — PALONOSETRON HCL INJECTION 0.25 MG/5ML
INTRAVENOUS | Status: AC
  Filled 2017-09-26: qty 5

## 2017-09-26 MED ORDER — SODIUM CHLORIDE 0.9 % IV SOLN
Freq: Once | INTRAVENOUS | Status: AC
  Administered 2017-09-26: 10:00:00 via INTRAVENOUS
  Filled 2017-09-26: qty 5

## 2017-09-26 MED ORDER — HEPARIN SOD (PORK) LOCK FLUSH 100 UNIT/ML IV SOLN
500.0000 [IU] | Freq: Once | INTRAVENOUS | Status: AC | PRN
  Administered 2017-09-26: 500 [IU]
  Filled 2017-09-26: qty 5

## 2017-09-26 MED ORDER — SODIUM CHLORIDE 0.9 % IV SOLN
Freq: Once | INTRAVENOUS | Status: AC
  Administered 2017-09-26: 08:00:00 via INTRAVENOUS

## 2017-09-26 MED ORDER — PALONOSETRON HCL INJECTION 0.25 MG/5ML
0.2500 mg | Freq: Once | INTRAVENOUS | Status: AC
  Administered 2017-09-26: 0.25 mg via INTRAVENOUS

## 2017-09-26 MED ORDER — SODIUM CHLORIDE 0.9 % IV SOLN
70.0000 mg | Freq: Once | INTRAVENOUS | Status: AC
  Administered 2017-09-26: 70 mg via INTRAVENOUS
  Filled 2017-09-26: qty 70

## 2017-09-26 MED ORDER — DEXTROSE-NACL 5-0.45 % IV SOLN
Freq: Once | INTRAVENOUS | Status: AC
  Administered 2017-09-26: 09:00:00 via INTRAVENOUS
  Filled 2017-09-26: qty 10

## 2017-09-26 MED ORDER — SODIUM CHLORIDE 0.9% FLUSH
10.0000 mL | INTRAVENOUS | Status: DC | PRN
  Administered 2017-09-26: 10 mL
  Filled 2017-09-26: qty 10

## 2017-09-26 NOTE — Patient Instructions (Signed)
[facility] Center Discharge Instructions for Patients Receiving Chemotherapy  Today you received the following chemotherapy agents: Cisplatin (Platinol).  To help prevent nausea and vomiting after your treatment, we encourage you to take your nausea medication as prescribed.   If you develop nausea and vomiting that is not controlled by your nausea medication, call the clinic.   BELOW ARE SYMPTOMS THAT SHOULD BE REPORTED IMMEDIATELY:  *FEVER GREATER THAN 100.5 F  *CHILLS WITH OR WITHOUT FEVER  NAUSEA AND VOMITING THAT IS NOT CONTROLLED WITH YOUR NAUSEA MEDICATION  *UNUSUAL SHORTNESS OF BREATH  *UNUSUAL BRUISING OR BLEEDING  TENDERNESS IN MOUTH AND THROAT WITH OR WITHOUT PRESENCE OF ULCERS  *URINARY PROBLEMS  *BOWEL PROBLEMS  UNUSUAL RASH Items with * indicate a potential emergency and should be followed up as soon as possible.  Feel free to call the clinic should you have any questions or concerns. The clinic phone number is [phone].  Please show the CHEMO ALERT CARD at check-in to the Emergency Department and triage nurse.   

## 2017-09-27 ENCOUNTER — Ambulatory Visit
Admission: RE | Admit: 2017-09-27 | Discharge: 2017-09-27 | Disposition: A | Discharge status: Home / Self Care | Payer: Medicare HMO | Source: Ambulatory Visit | Attending: Radiation Oncology | Admitting: Radiation Oncology

## 2017-09-27 DIAGNOSIS — Z510 Encounter for antineoplastic radiation therapy: Secondary | ICD-10-CM | POA: Diagnosis not present

## 2017-09-28 ENCOUNTER — Ambulatory Visit
Admission: RE | Admit: 2017-09-28 | Discharge: 2017-09-28 | Disposition: A | Discharge status: Home / Self Care | Payer: Medicare HMO | Source: Ambulatory Visit | Attending: Radiation Oncology | Admitting: Radiation Oncology

## 2017-09-28 DIAGNOSIS — Z510 Encounter for antineoplastic radiation therapy: Secondary | ICD-10-CM | POA: Diagnosis not present

## 2017-09-29 ENCOUNTER — Ambulatory Visit
Admission: RE | Admit: 2017-09-29 | Discharge: 2017-09-29 | Disposition: A | Discharge status: Home / Self Care | Payer: Medicare HMO | Source: Ambulatory Visit | Attending: Radiation Oncology | Admitting: Radiation Oncology

## 2017-09-29 DIAGNOSIS — Z510 Encounter for antineoplastic radiation therapy: Secondary | ICD-10-CM | POA: Diagnosis not present

## 2017-10-01 ENCOUNTER — Other Ambulatory Visit (HOSPITAL_BASED_OUTPATIENT_CLINIC_OR_DEPARTMENT_OTHER): Payer: Medicare HMO

## 2017-10-01 ENCOUNTER — Ambulatory Visit
Admission: RE | Admit: 2017-10-01 | Discharge: 2017-10-01 | Disposition: A | Discharge status: Home / Self Care | Payer: Medicare HMO | Source: Ambulatory Visit | Attending: Radiation Oncology | Admitting: Radiation Oncology

## 2017-10-01 DIAGNOSIS — C539 Malignant neoplasm of cervix uteri, unspecified: Secondary | ICD-10-CM

## 2017-10-01 DIAGNOSIS — Z510 Encounter for antineoplastic radiation therapy: Secondary | ICD-10-CM | POA: Diagnosis not present

## 2017-10-01 DIAGNOSIS — R918 Other nonspecific abnormal finding of lung field: Secondary | ICD-10-CM

## 2017-10-01 LAB — COMPREHENSIVE METABOLIC PANEL
ALBUMIN: 3.6 g/dL (ref 3.5–5.0)
ALK PHOS: 79 U/L (ref 40–150)
ALT: 21 U/L (ref 0–55)
AST: 17 U/L (ref 5–34)
Anion Gap: 9 mEq/L (ref 3–11)
BUN: 10.4 mg/dL (ref 7.0–26.0)
CALCIUM: 9.1 mg/dL (ref 8.4–10.4)
CHLORIDE: 104 meq/L (ref 98–109)
CO2: 26 mEq/L (ref 22–29)
Creatinine: 0.9 mg/dL (ref 0.6–1.1)
EGFR: >60 mL/min/{1.73_m2} (ref >60)
Glucose: 105 mg/dl (ref 70–140)
POTASSIUM: 3.8 meq/L (ref 3.5–5.1)
Sodium: 139 mEq/L (ref 136–145)
Total Bilirubin: 0.3 mg/dL (ref 0.20–1.20)
Total Protein: 7 g/dL (ref 6.4–8.3)

## 2017-10-01 LAB — CBC WITH DIFFERENTIAL/PLATELET
BASO%: 0.4 % (ref 0.0–2.0)
Basophils Absolute: 0 10*3/uL (ref 0.0–0.1)
EOS ABS: 0.1 10*3/uL (ref 0.0–0.5)
EOS%: 2.3 % (ref 0.0–7.0)
HEMATOCRIT: 36.2 % (ref 34.8–46.6)
HEMOGLOBIN: 12 g/dL (ref 11.6–15.9)
LYMPH#: 0.3 10*3/uL — AB (ref 0.9–3.3)
LYMPH%: 10.2 % — ABNORMAL LOW (ref 14.0–49.7)
MCH: 29.1 pg (ref 25.1–34.0)
MCHC: 33.1 g/dL (ref 31.5–36.0)
MCV: 87.7 fL (ref 79.5–101.0)
MONO#: 0.2 10*3/uL (ref 0.1–0.9)
MONO%: 7.2 % (ref 0.0–14.0)
NEUT%: 79.9 % — ABNORMAL HIGH (ref 38.4–76.8)
NEUTROS ABS: 2.1 10*3/uL (ref 1.5–6.5)
PLATELETS: 161 10*3/uL (ref 145–400)
RBC: 4.13 10*6/uL (ref 3.70–5.45)
RDW: 12.3 % (ref 11.2–14.5)
WBC: 2.6 10*3/uL — AB (ref 3.9–10.3)

## 2017-10-01 LAB — MAGNESIUM: Magnesium: 1.8 mg/dl (ref 1.5–2.5)

## 2017-10-02 ENCOUNTER — Ambulatory Visit (HOSPITAL_BASED_OUTPATIENT_CLINIC_OR_DEPARTMENT_OTHER): Payer: Medicare HMO | Admitting: Hematology and Oncology

## 2017-10-02 ENCOUNTER — Ambulatory Visit
Admission: RE | Admit: 2017-10-02 | Discharge: 2017-10-02 | Disposition: A | Discharge status: Home / Self Care | Payer: Medicare HMO | Source: Ambulatory Visit | Attending: Radiation Oncology | Admitting: Radiation Oncology

## 2017-10-02 DIAGNOSIS — T451X5A Adverse effect of antineoplastic and immunosuppressive drugs, initial encounter: Secondary | ICD-10-CM

## 2017-10-02 DIAGNOSIS — D701 Agranulocytosis secondary to cancer chemotherapy: Secondary | ICD-10-CM | POA: Diagnosis not present

## 2017-10-02 DIAGNOSIS — R630 Anorexia: Secondary | ICD-10-CM | POA: Diagnosis not present

## 2017-10-02 DIAGNOSIS — C530 Malignant neoplasm of endocervix: Secondary | ICD-10-CM

## 2017-10-02 DIAGNOSIS — Z510 Encounter for antineoplastic radiation therapy: Secondary | ICD-10-CM | POA: Diagnosis not present

## 2017-10-02 DIAGNOSIS — C541 Malignant neoplasm of endometrium: Secondary | ICD-10-CM

## 2017-10-03 ENCOUNTER — Telehealth: Payer: Self-pay | Admitting: Hematology and Oncology

## 2017-10-03 ENCOUNTER — Ambulatory Visit
Admission: RE | Admit: 2017-10-03 | Discharge: 2017-10-03 | Disposition: A | Discharge status: Home / Self Care | Payer: Medicare HMO | Source: Ambulatory Visit | Attending: Radiation Oncology | Admitting: Radiation Oncology

## 2017-10-03 ENCOUNTER — Ambulatory Visit (HOSPITAL_BASED_OUTPATIENT_CLINIC_OR_DEPARTMENT_OTHER): Payer: Medicare HMO

## 2017-10-03 VITALS — BP 124/58 | HR 86 | Temp 98.0°F | Resp 17 | Ht 65.0 in | Wt 159.5 lb

## 2017-10-03 DIAGNOSIS — C541 Malignant neoplasm of endometrium: Secondary | ICD-10-CM | POA: Diagnosis not present

## 2017-10-03 DIAGNOSIS — C539 Malignant neoplasm of cervix uteri, unspecified: Secondary | ICD-10-CM

## 2017-10-03 DIAGNOSIS — Z5111 Encounter for antineoplastic chemotherapy: Secondary | ICD-10-CM

## 2017-10-03 DIAGNOSIS — Z510 Encounter for antineoplastic radiation therapy: Secondary | ICD-10-CM | POA: Diagnosis not present

## 2017-10-03 MED ORDER — SODIUM CHLORIDE 0.9% FLUSH
10.0000 mL | INTRAVENOUS | Status: DC | PRN
  Administered 2017-10-03: 10 mL
  Filled 2017-10-03: qty 10

## 2017-10-03 MED ORDER — SODIUM CHLORIDE 0.9 % IV SOLN
Freq: Once | INTRAVENOUS | Status: AC
  Administered 2017-10-03: 09:00:00 via INTRAVENOUS

## 2017-10-03 MED ORDER — POTASSIUM CHLORIDE 2 MEQ/ML IV SOLN
Freq: Once | INTRAVENOUS | Status: AC
  Administered 2017-10-03: 10:00:00 via INTRAVENOUS
  Filled 2017-10-03: qty 10

## 2017-10-03 MED ORDER — PALONOSETRON HCL INJECTION 0.25 MG/5ML
0.2500 mg | Freq: Once | INTRAVENOUS | Status: AC
  Administered 2017-10-03: 0.25 mg via INTRAVENOUS

## 2017-10-03 MED ORDER — PALONOSETRON HCL INJECTION 0.25 MG/5ML
INTRAVENOUS | Status: AC
  Filled 2017-10-03: qty 5

## 2017-10-03 MED ORDER — SODIUM CHLORIDE 0.9 % IV SOLN
Freq: Once | INTRAVENOUS | Status: AC
  Administered 2017-10-03: 11:00:00 via INTRAVENOUS
  Filled 2017-10-03: qty 5

## 2017-10-03 MED ORDER — HEPARIN SOD (PORK) LOCK FLUSH 100 UNIT/ML IV SOLN
500.0000 [IU] | Freq: Once | INTRAVENOUS | Status: AC | PRN
Start: 2017-10-03 — End: 2017-10-03
  Administered 2017-10-03: 500 [IU]
  Filled 2017-10-03: qty 5

## 2017-10-03 MED ORDER — SODIUM CHLORIDE 0.9 % IV SOLN
38.0000 mg/m2 | Freq: Once | INTRAVENOUS | Status: AC
  Administered 2017-10-03: 70 mg via INTRAVENOUS
  Filled 2017-10-03: qty 70

## 2017-10-03 NOTE — Telephone Encounter (Signed)
No 12/18 los. °

## 2017-10-03 NOTE — Patient Instructions (Signed)
[facility] Discharge Instructions for Patients Receiving Chemotherapy  Today you received the following chemotherapy agents: Cisplatin (Platinol).  To help prevent nausea and vomiting after your treatment, we encourage you to take your nausea medication as prescribed.   If you develop nausea and vomiting that is not controlled by your nausea medication, call the clinic.   BELOW ARE SYMPTOMS THAT SHOULD BE REPORTED IMMEDIATELY:  *FEVER GREATER THAN 100.5 F  *CHILLS WITH OR WITHOUT FEVER  NAUSEA AND VOMITING THAT IS NOT CONTROLLED WITH YOUR NAUSEA MEDICATION  *UNUSUAL SHORTNESS OF BREATH  *UNUSUAL BRUISING OR BLEEDING  TENDERNESS IN MOUTH AND THROAT WITH OR WITHOUT PRESENCE OF ULCERS  *URINARY PROBLEMS  *BOWEL PROBLEMS  UNUSUAL RASH Items with * indicate a potential emergency and should be followed up as soon as possible.  Feel free to call the clinic should you have any questions or concerns. The clinic phone number is [phone].  Please show the CHEMO ALERT CARD at check-in to the Emergency Department and triage nurse.   

## 2017-10-04 ENCOUNTER — Ambulatory Visit
Admission: RE | Admit: 2017-10-04 | Discharge: 2017-10-04 | Disposition: A | Discharge status: Home / Self Care | Payer: Medicare HMO | Source: Ambulatory Visit | Attending: Radiation Oncology | Admitting: Radiation Oncology

## 2017-10-04 ENCOUNTER — Encounter: Payer: Self-pay | Admitting: Hematology and Oncology

## 2017-10-04 DIAGNOSIS — Z510 Encounter for antineoplastic radiation therapy: Secondary | ICD-10-CM | POA: Diagnosis not present

## 2017-10-04 DIAGNOSIS — D701 Agranulocytosis secondary to cancer chemotherapy: Secondary | ICD-10-CM | POA: Insufficient documentation

## 2017-10-04 DIAGNOSIS — T451X5A Adverse effect of antineoplastic and immunosuppressive drugs, initial encounter: Secondary | ICD-10-CM

## 2017-10-04 NOTE — Assessment & Plan Note (Signed)
She has poor appetite We discussed trial of Remeron but the patient declined I think there is a component of nausea and I recommend she take antiemetics on a regular basis

## 2017-10-04 NOTE — Progress Notes (Signed)
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 Examination revealed abnormal cervix. Vaginal ultrasound showed a 10 cm uterus with an intramural fibroid, left ovary is slightly enlarged measuring 2.1 x 1.5 x 2.3 cm which is described as solid complex with a vascular flow. In addition the cervix is imaged showing a solid vascular mass measuring 2.7 x 2.1 x 2.2 cm. A sonohysterogram showed no abnormalities in the endometrium. Patient had a Pap smear showing high-grade squamous intraepithelial lesion with features suspicious for invasion as well as atypical glandular cells. 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      08/29/2017 PET scan    1. Cervical malignancy with maximum SUV 6.0, dominant cervical mass measuring 6.2 by 6.0 by 7.3 cm. 2. 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 3. There 12 scattered small pulmonary nodules measuring up to 5 mm in diameter. No hypermetabolic activity but these are below sensitive PET-CT size thresholds. These could be postinflammatory or less likely neoplastic, surveillance is recommended. 4. Hypodense thyroid nodules are not hypermetabolic which strongly favors benign etiology.      09/10/2017 Procedure    Placement of a subcutaneous port device.      09/12/2017 -  Chemotherapy    The patient had palonosetron (ALOXI) injection 0.25 mg, 0.25 mg, Intravenous,  Once, 1 of 5  cycles  Administration: 0.25 mg (09/12/2017)    CISplatin (PLATINOL) 70 mg in sodium chloride 0.9 % 250 mL chemo infusion, 67 mg (90 % of original dose 40 mg/m2), Intravenous,  Once, 1 of 5 cycles  Dose modification: 36 mg/m2 (90 % of original dose 40 mg/m2, Cycle 1, Reason: Other (see comments), Comment: cap at maximum 70 mg)  Administration: 70 mg (09/12/2017)    fosaprepitant (EMEND) 150 mg, dexamethasone (DECADRON) 12 mg in sodium chloride 0.9 % 145 mL IVPB, , Intravenous,  Once, 1 of 5 cycles  Administration:  (09/12/2017)  for chemotherapy treatment.  She received weekly cisplatin      09/12/2017 -  Radiation Therapy    She had radiation treatment       INTERVAL HISTORY: Please see below for problem oriented charting. She is seen prior to chemotherapy She tolerated recent treatment well She denies recent fever or chills She has less vaginal bleeding She has some mild nausea and reduced appetite with minor weight loss She has not been taking a lot of anti-emetics She denies pain  REVIEW OF SYSTEMS:   Constitutional: Denies fevers, chills  Eyes: Denies blurriness of vision Ears, nose, mouth, throat, and face: Denies mucositis or sore throat Respiratory: Denies cough, dyspnea or wheezes Cardiovascular: Denies palpitation, chest discomfort or lower extremity swelling Skin: Denies abnormal skin rashes Lymphatics: Denies new lymphadenopathy or easy bruising Neurological:Denies numbness, tingling or new weaknesses Behavioral/Psych: Mood is stable, no new changes  All other systems were reviewed with the patient and are negative.  I have reviewed the past medical history, past surgical history, social history and family history with the patient and they  are unchanged from previous note.  ALLERGIES:  has No Known Allergies.  MEDICATIONS:  Current Outpatient Medications  Medication Sig Dispense Refill  . lidocaine-prilocaine (EMLA) cream Apply to affected area once  30 g 3  . ondansetron (ZOFRAN) 8 MG tablet Take 1 tablet (8 mg total) by mouth 2 (two) times daily as needed. Start on the third day after chemotherapy. 30 tablet 1  . prochlorperazine (COMPAZINE) 10 MG tablet Take 1 tablet (10 mg total) by mouth every 6 (six) hours as needed (Nausea or vomiting). 30 tablet 1   No current facility-administered medications for this visit.     PHYSICAL EXAMINATION: ECOG PERFORMANCE STATUS: 1 - Symptomatic but completely ambulatory  Vitals:   10/02/17 0938  BP: 131/73  Pulse: 85  Resp: 18  Temp: 98.5 F (36.9 C)  SpO2: 100%   Filed Weights   10/02/17 0938  Weight: 158 lb (71.7 kg)    GENERAL:alert, no distress and comfortable SKIN: skin color, texture, turgor are normal, no rashes or significant lesions EYES: normal, Conjunctiva are pink and non-injected, sclera clear OROPHARYNX:no exudate, no erythema and lips, buccal mucosa, and tongue normal  NECK: supple, thyroid normal size, non-tender, without nodularity LYMPH:  no palpable lymphadenopathy in the cervical, axillary or inguinal LUNGS: clear to auscultation and percussion with normal breathing effort HEART: regular rate & rhythm and no murmurs and no lower extremity edema ABDOMEN:abdomen soft, non-tender and normal bowel sounds Musculoskeletal:no cyanosis of digits and no clubbing  NEURO: alert & oriented x 3 with fluent speech, no focal motor/sensory deficits  LABORATORY DATA:  I have reviewed the data as listed    Component Value Date/Time   NA 139 10/01/2017 0851   K 3.8 10/01/2017 0851   CL 104 09/10/2017 0834   CO2 26 10/01/2017 0851   GLUCOSE 105 10/01/2017 0851   BUN 10.4 10/01/2017 0851   CREATININE 0.9 10/01/2017 0851   CALCIUM 9.1 10/01/2017 0851   PROT 7.0 10/01/2017 0851   ALBUMIN 3.6 10/01/2017 0851   AST 17 10/01/2017 0851   ALT 21 10/01/2017 0851   ALKPHOS 79 10/01/2017 0851   BILITOT 0.30 10/01/2017 0851   GFRNONAA 56 (L) 09/10/2017 0834   GFRAA >60 09/10/2017  0834    No results found for: SPEP, UPEP  Lab Results  Component Value Date   WBC 2.6 (L) 10/01/2017   NEUTROABS 2.1 10/01/2017   HGB 12.0 10/01/2017   HCT 36.2 10/01/2017   MCV 87.7 10/01/2017   PLT 161 10/01/2017      Chemistry      Component Value Date/Time   NA 139 10/01/2017 0851   K 3.8 10/01/2017 0851   CL 104 09/10/2017 0834   CO2 26 10/01/2017 0851   BUN 10.4 10/01/2017 0851   CREATININE 0.9 10/01/2017 0851      Component Value Date/Time   CALCIUM 9.1 10/01/2017 0851   ALKPHOS 79 10/01/2017 0851   AST 17 10/01/2017 0851   ALT 21 10/01/2017 0851   BILITOT 0.30 10/01/2017 0851       RADIOGRAPHIC STUDIES: I have personally reviewed the radiological images as listed and agreed with the findings in the report. Ir US Guide Vasc Access Right  Result Date: 09/10/2017 INDICATION: 66 year old with malignant neoplasm of cervix. Port-A-Cath needed for treatment. EXAM: FLUOROSCOPIC AND ULTRASOUND GUIDED PLACEMENT OF A SUBCUTANEOUS PORT COMPARISON:  None. MEDICATIONS: Ancef 2 g; The antibiotic was administered within an appropriate time interval prior to skin puncture. ANESTHESIA/SEDATION: Versed 1.0 mg IV; Fentanyl  50 mcg IV; Moderate Sedation Time:  30 minutes The patient was continuously monitored during the procedure by the interventional radiology nurse under my direct supervision. FLUOROSCOPY TIME:  18 seconds COMPLICATIONS: None immediate. PROCEDURE: The procedure, risks, benefits, and alternatives were explained to the patient. Questions regarding the procedure were encouraged and answered. The patient understands and consents to the procedure. Patient was placed supine on the interventional table. Ultrasound confirmed a patent right internal jugular vein. The right chest and neck were cleaned with a skin antiseptic and a sterile drape was placed. Maximal barrier sterile technique was utilized including caps, mask, sterile gowns, sterile gloves, sterile drape, hand hygiene  and skin antiseptic. The right neck was anesthetized with 1% lidocaine. Small incision was made in the right neck with a blade. Micropuncture set was placed in the right internal jugular vein with ultrasound guidance. The micropuncture wire was used for measurement purposes. The right chest was anesthetized with 1% lidocaine. #15 blade was used to make an incision and a subcutaneous port pocket was formed. [place] was assembled. Subcutaneous tunnel was formed with a stiff tunneling device. The port catheter was brought through the subcutaneous tunnel. The port was placed in the subcutaneous pocket. The micropuncture set was exchanged for a peel-away sheath. The catheter was placed through the peel-away sheath and the tip was positioned at the superior cavoatrial junction. Catheter placement was confirmed with fluoroscopy. The port was accessed and flushed with heparinized saline. The port pocket was closed using two layers of absorbable sutures and Dermabond. The vein skin site was closed using a single layer of absorbable suture and Dermabond. Sterile dressings were applied. Patient tolerated the procedure well without an immediate complication. Ultrasound and fluoroscopic images were taken and saved for this procedure. IMPRESSION: Placement of a subcutaneous port device. Electronically Signed   By: Markus Daft M.D.   On: 09/10/2017 11:32   Ir Fluoro Guide Port Insertion Right  Result Date: 09/10/2017 INDICATION: 66 year old with malignant neoplasm of cervix. Port-A-Cath needed for treatment. EXAM: FLUOROSCOPIC AND ULTRASOUND GUIDED PLACEMENT OF A SUBCUTANEOUS PORT COMPARISON:  None. MEDICATIONS: Ancef 2 g; The antibiotic was administered within an appropriate time interval prior to skin puncture. ANESTHESIA/SEDATION: Versed 1.0 mg IV; Fentanyl 50 mcg IV; Moderate Sedation Time:  30 minutes The patient was continuously monitored during the procedure by the interventional radiology nurse under my  direct supervision. FLUOROSCOPY TIME:  18 seconds COMPLICATIONS: None immediate. PROCEDURE: The procedure, risks, benefits, and alternatives were explained to the patient. Questions regarding the procedure were encouraged and answered. The patient understands and consents to the procedure. Patient was placed supine on the interventional table. Ultrasound confirmed a patent right internal jugular vein. The right chest and neck were cleaned with a skin antiseptic and a sterile drape was placed. Maximal barrier sterile technique was utilized including caps, mask, sterile gowns, sterile gloves, sterile drape, hand hygiene and skin antiseptic. The right neck was anesthetized with 1% lidocaine. Small incision was made in the right neck with a blade. Micropuncture set was placed in the right internal jugular vein with ultrasound guidance. The micropuncture wire was used for measurement purposes. The right chest was anesthetized with 1% lidocaine. #15 blade was used to make an incision and a subcutaneous port pocket was formed. [place] was assembled. Subcutaneous tunnel was formed with a stiff tunneling device. The port catheter was brought through the subcutaneous tunnel. The port was placed in the subcutaneous pocket. The micropuncture set was  exchanged for a peel-away sheath. The catheter was placed through the peel-away sheath and the tip was positioned at the superior cavoatrial junction. Catheter placement was confirmed with fluoroscopy. The port was accessed and flushed with heparinized saline. The port pocket was closed using two layers of absorbable sutures and Dermabond. The vein skin site was closed using a single layer of absorbable suture and Dermabond. Sterile dressings were applied. Patient tolerated the procedure well without an immediate complication. Ultrasound and fluoroscopic images were taken and saved for this procedure. IMPRESSION: Placement of a subcutaneous port device. Electronically  Signed   By: Markus Daft M.D.   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 The patient denies recent history of fevers, cough, chills, diarrhea or dysuria. She is asymptomatic from the leukopenia. I will observe for now.  I will continue the chemotherapy at current dose without dosage adjustment.  If the leukopenia gets progressive worse in the future, I might have to delay her treatment or adjust the chemotherapy dose. We discussed neutropenic precautions   Poor appetite She has poor appetite We discussed trial of Remeron but the patient declined I think there is a component of nausea and I recommend she take antiemetics on a regular basis   No orders of the defined types were placed in this encounter.  All questions were answered. The patient knows to call the clinic with any problems, questions or concerns. No barriers to learning was detected. I spent 15 minutes counseling the patient face to face. The total time spent in the appointment was 20 minutes and more than 50% was on counseling and review of test results     Heath Lark, MD 10/04/2017 7:22 AM

## 2017-10-04 NOTE — Assessment & Plan Note (Signed)
So far, she tolerated treatment well without side effects I would continue to see her on a weekly basis for supportive care She felt that her vaginal bleeding is less

## 2017-10-04 NOTE — Assessment & Plan Note (Signed)
This is likely due to recent treatment. The patient denies recent history of fevers, cough, chills, diarrhea or dysuria. She is asymptomatic from the leukopenia. I will observe for now.  I will continue the chemotherapy at current dose without dosage adjustment.  If the leukopenia gets progressive worse in the future, I might have to delay her treatment or adjust the chemotherapy dose. We discussed neutropenic precautions   

## 2017-10-05 ENCOUNTER — Ambulatory Visit
Admission: RE | Admit: 2017-10-05 | Discharge: 2017-10-05 | Disposition: A | Discharge status: Home / Self Care | Payer: Medicare HMO | Source: Ambulatory Visit | Attending: Radiation Oncology | Admitting: Radiation Oncology

## 2017-10-05 DIAGNOSIS — Z510 Encounter for antineoplastic radiation therapy: Secondary | ICD-10-CM | POA: Diagnosis not present

## 2017-10-08 ENCOUNTER — Ambulatory Visit
Admission: RE | Admit: 2017-10-08 | Discharge: 2017-10-08 | Disposition: A | Discharge status: Home / Self Care | Payer: Medicare HMO | Source: Ambulatory Visit | Attending: Radiation Oncology | Admitting: Radiation Oncology

## 2017-10-08 ENCOUNTER — Telehealth: Payer: Self-pay | Admitting: Hematology and Oncology

## 2017-10-08 ENCOUNTER — Ambulatory Visit (HOSPITAL_BASED_OUTPATIENT_CLINIC_OR_DEPARTMENT_OTHER): Payer: Medicare HMO | Admitting: Hematology and Oncology

## 2017-10-08 ENCOUNTER — Encounter: Payer: Self-pay | Admitting: Hematology and Oncology

## 2017-10-08 ENCOUNTER — Other Ambulatory Visit (HOSPITAL_BASED_OUTPATIENT_CLINIC_OR_DEPARTMENT_OTHER): Payer: Medicare HMO

## 2017-10-08 DIAGNOSIS — T451X5A Adverse effect of antineoplastic and immunosuppressive drugs, initial encounter: Secondary | ICD-10-CM

## 2017-10-08 DIAGNOSIS — Z510 Encounter for antineoplastic radiation therapy: Secondary | ICD-10-CM | POA: Diagnosis not present

## 2017-10-08 DIAGNOSIS — R918 Other nonspecific abnormal finding of lung field: Secondary | ICD-10-CM

## 2017-10-08 DIAGNOSIS — D701 Agranulocytosis secondary to cancer chemotherapy: Secondary | ICD-10-CM | POA: Diagnosis not present

## 2017-10-08 DIAGNOSIS — C541 Malignant neoplasm of endometrium: Secondary | ICD-10-CM

## 2017-10-08 DIAGNOSIS — R630 Anorexia: Secondary | ICD-10-CM

## 2017-10-08 DIAGNOSIS — C530 Malignant neoplasm of endocervix: Secondary | ICD-10-CM

## 2017-10-08 DIAGNOSIS — C539 Malignant neoplasm of cervix uteri, unspecified: Secondary | ICD-10-CM

## 2017-10-08 DIAGNOSIS — R110 Nausea: Secondary | ICD-10-CM

## 2017-10-08 LAB — CBC WITH DIFFERENTIAL/PLATELET
BASO%: 0.6 % (ref 0.0–2.0)
BASOS ABS: 0 10*3/uL (ref 0.0–0.1)
EOS%: 3.9 % (ref 0.0–7.0)
Eosinophils Absolute: 0.1 10*3/uL (ref 0.0–0.5)
HEMATOCRIT: 36.5 % (ref 34.8–46.6)
HGB: 12.1 g/dL (ref 11.6–15.9)
LYMPH#: 0.2 10*3/uL — AB (ref 0.9–3.3)
LYMPH%: 9.9 % — AB (ref 14.0–49.7)
MCH: 28.9 pg (ref 25.1–34.0)
MCHC: 33.2 g/dL (ref 31.5–36.0)
MCV: 87.1 fL (ref 79.5–101.0)
MONO#: 0.3 10*3/uL (ref 0.1–0.9)
MONO%: 13.8 % (ref 0.0–14.0)
NEUT#: 1.3 10*3/uL — ABNORMAL LOW (ref 1.5–6.5)
NEUT%: 71.8 % (ref 38.4–76.8)
Platelets: 143 10*3/uL — ABNORMAL LOW (ref 145–400)
RBC: 4.19 10*6/uL (ref 3.70–5.45)
RDW: 12.6 % (ref 11.2–14.5)
WBC: 1.8 10*3/uL — ABNORMAL LOW (ref 3.9–10.3)

## 2017-10-08 LAB — COMPREHENSIVE METABOLIC PANEL
ALBUMIN: 3.8 g/dL (ref 3.5–5.0)
ALK PHOS: 82 U/L (ref 40–150)
ALT: 31 U/L (ref 0–55)
AST: 22 U/L (ref 5–34)
Anion Gap: 11 mEq/L (ref 3–11)
BILIRUBIN TOTAL: 0.32 mg/dL (ref 0.20–1.20)
BUN: 14.4 mg/dL (ref 7.0–26.0)
CO2: 26 meq/L (ref 22–29)
CREATININE: 1.1 mg/dL (ref 0.6–1.1)
Calcium: 9.5 mg/dL (ref 8.4–10.4)
Chloride: 103 mEq/L (ref 98–109)
EGFR: 53 mL/min/{1.73_m2} — AB (ref >60)
GLUCOSE: 101 mg/dL (ref 70–140)
Potassium: 4.3 mEq/L (ref 3.5–5.1)
SODIUM: 140 meq/L (ref 136–145)
TOTAL PROTEIN: 7.1 g/dL (ref 6.4–8.3)

## 2017-10-08 LAB — MAGNESIUM: MAGNESIUM: 1.8 mg/dL (ref 1.5–2.5)

## 2017-10-08 NOTE — Assessment & Plan Note (Signed)
We discussed the importance of taking antiemetics regularly

## 2017-10-08 NOTE — Assessment & Plan Note (Signed)
She will received her last dose of chemotherapy this week The patient is developing neutropenia I would proceed with dose adjustment and recommend additional coverage with G-CSF

## 2017-10-08 NOTE — Assessment & Plan Note (Signed)
She has poor appetite We discussed trial of Remeron but the patient declined I think there is a component of nausea and I recommend she take antiemetics on a regular basis

## 2017-10-08 NOTE — Assessment & Plan Note (Signed)
I plan to reduce a dose of chemotherapy We discussed the risk and benefit of G-CSF support We discussed neutropenic precautions

## 2017-10-08 NOTE — Telephone Encounter (Signed)
Scheduled appt per 12/24 los - Gave patient AVS and calender per los.  

## 2017-10-08 NOTE — Progress Notes (Signed)
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 Examination revealed abnormal cervix. Vaginal ultrasound showed a 10 cm uterus with an intramural fibroid, left ovary is slightly enlarged measuring 2.1 x 1.5 x 2.3 cm which is described as solid complex with a vascular flow. In addition the cervix is imaged showing a solid vascular mass measuring 2.7 x 2.1 x 2.2 cm. A sonohysterogram showed no abnormalities in the endometrium. Patient had a Pap smear showing high-grade squamous intraepithelial lesion with features suspicious for invasion as well as atypical glandular cells. 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      08/29/2017 PET scan    1. Cervical malignancy with maximum SUV 6.0, dominant cervical mass measuring 6.2 by 6.0 by 7.3 cm. 2. 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 3. There 12 scattered small pulmonary nodules measuring up to 5 mm in diameter. No hypermetabolic activity but these are below sensitive PET-CT size thresholds. These could be postinflammatory or less likely neoplastic, surveillance is recommended. 4. Hypodense thyroid nodules are not hypermetabolic which strongly favors benign etiology.      09/10/2017 Procedure    Placement of a subcutaneous port device.      09/12/2017 -  Chemotherapy    The patient had palonosetron (ALOXI) injection 0.25 mg, 0.25 mg, Intravenous,  Once, 1 of 5  cycles  Administration: 0.25 mg (09/12/2017)    CISplatin (PLATINOL) 70 mg in sodium chloride 0.9 % 250 mL chemo infusion, 67 mg (90 % of original dose 40 mg/m2), Intravenous,  Once, 1 of 5 cycles  Dose modification: 36 mg/m2 (90 % of original dose 40 mg/m2, Cycle 1, Reason: Other (see comments), Comment: cap at maximum 70 mg)  Administration: 70 mg (09/12/2017)    fosaprepitant (EMEND) 150 mg, dexamethasone (DECADRON) 12 mg in sodium chloride 0.9 % 145 mL IVPB, , Intravenous,  Once, 1 of 5 cycles  Administration:  (09/12/2017)  for chemotherapy treatment.  She received weekly cisplatin      09/12/2017 -  Radiation Therapy    She had radiation treatment       INTERVAL HISTORY: Please see below for problem oriented charting. She returns for further follow-up, to be seen prior to final dose of treatment She has lost some weight attributing that to nausea She felt the antiemetics has helped She denies recent infection, fever or chills No tinnitus or peripheral neuropathy  REVIEW OF SYSTEMS:   Constitutional: Denies fevers, chills or abnormal weight loss Eyes: Denies blurriness of vision Ears, nose, mouth, throat, and face: Denies mucositis or sore throat Respiratory: Denies cough, dyspnea or wheezes Cardiovascular: Denies palpitation, chest discomfort or lower extremity swelling Skin: Denies abnormal skin rashes Lymphatics: Denies new lymphadenopathy or easy bruising Neurological:Denies numbness, tingling or new weaknesses Behavioral/Psych: Mood is stable, no new changes  All other systems were reviewed with the patient and are negative.  I have reviewed the past medical history, past surgical history, social history and family history with the patient and they are unchanged  from previous note.  ALLERGIES:  has No Known Allergies.  MEDICATIONS:  Current Outpatient Medications  Medication Sig Dispense Refill  . lidocaine-prilocaine (EMLA) cream Apply to affected area  once 30 g 3  . ondansetron (ZOFRAN) 8 MG tablet Take 1 tablet (8 mg total) by mouth 2 (two) times daily as needed. Start on the third day after chemotherapy. 30 tablet 1  . prochlorperazine (COMPAZINE) 10 MG tablet Take 1 tablet (10 mg total) by mouth every 6 (six) hours as needed (Nausea or vomiting). 30 tablet 1   No current facility-administered medications for this visit.     PHYSICAL EXAMINATION: ECOG PERFORMANCE STATUS: 1 - Symptomatic but completely ambulatory  Vitals:   10/08/17 0918  BP: 125/62  Pulse: (!) 108  Resp: 16  Temp: 98 F (36.7 C)  SpO2: 99%   Filed Weights   10/08/17 0918  Weight: 156 lb 6.4 oz (70.9 kg)    GENERAL:alert, no distress and comfortable SKIN: skin color, texture, turgor are normal, no rashes or significant lesions EYES: normal, Conjunctiva are pink and non-injected, sclera clear OROPHARYNX:no exudate, no erythema and lips, buccal mucosa, and tongue normal  NECK: supple, thyroid normal size, non-tender, without nodularity LYMPH:  no palpable lymphadenopathy in the cervical, axillary or inguinal LUNGS: clear to auscultation and percussion with normal breathing effort HEART: regular rate & rhythm and no murmurs and no lower extremity edema ABDOMEN:abdomen soft, non-tender and normal bowel sounds Musculoskeletal:no cyanosis of digits and no clubbing  NEURO: alert & oriented x 3 with fluent speech, no focal motor/sensory deficits  LABORATORY DATA:  I have reviewed the data as listed    Component Value Date/Time   NA 140 10/08/2017 0903   K 4.3 10/08/2017 0903   CL 104 09/10/2017 0834   CO2 26 10/08/2017 0903   GLUCOSE 101 10/08/2017 0903   BUN 14.4 10/08/2017 0903   CREATININE 1.1 10/08/2017 0903   CALCIUM 9.5 10/08/2017 0903   PROT 7.1 10/08/2017 0903   ALBUMIN 3.8 10/08/2017 0903   AST 22 10/08/2017 0903   ALT 31 10/08/2017 0903   ALKPHOS 82 10/08/2017 0903   BILITOT 0.32 10/08/2017 0903   GFRNONAA 56 (L) 09/10/2017 0834   GFRAA  >60 09/10/2017 0834    No results found for: SPEP, UPEP  Lab Results  Component Value Date   WBC 1.8 (L) 10/08/2017   NEUTROABS 1.3 (L) 10/08/2017   HGB 12.1 10/08/2017   HCT 36.5 10/08/2017   MCV 87.1 10/08/2017   PLT 143 (L) 10/08/2017      Chemistry      Component Value Date/Time   NA 140 10/08/2017 0903   K 4.3 10/08/2017 0903   CL 104 09/10/2017 0834   CO2 26 10/08/2017 0903   BUN 14.4 10/08/2017 0903   CREATININE 1.1 10/08/2017 0903      Component Value Date/Time   CALCIUM 9.5 10/08/2017 0903   ALKPHOS 82 10/08/2017 0903   AST 22 10/08/2017 0903   ALT 31 10/08/2017 0903   BILITOT 0.32 10/08/2017 0903       RADIOGRAPHIC STUDIES: I have personally reviewed the radiological images as listed and agreed with the findings in the report. Ir US Guide Vasc Access Right  Result Date: 09/10/2017 INDICATION: 66 year old with malignant neoplasm of cervix. Port-A-Cath needed for treatment. EXAM: FLUOROSCOPIC AND ULTRASOUND GUIDED PLACEMENT OF A SUBCUTANEOUS PORT COMPARISON:  None. MEDICATIONS: Ancef 2 g; The antibiotic was administered within an appropriate time interval prior to skin puncture. ANESTHESIA/SEDATION: Versed 1.0  mg IV; Fentanyl 50 mcg IV; Moderate Sedation Time:  30 minutes The patient was continuously monitored during the procedure by the interventional radiology nurse under my direct supervision. FLUOROSCOPY TIME:  18 seconds COMPLICATIONS: None immediate. PROCEDURE: The procedure, risks, benefits, and alternatives were explained to the patient. Questions regarding the procedure were encouraged and answered. The patient understands and consents to the procedure. Patient was placed supine on the interventional table. Ultrasound confirmed a patent right internal jugular vein. The right chest and neck were cleaned with a skin antiseptic and a sterile drape was placed. Maximal barrier sterile technique was utilized including caps, mask, sterile gowns, sterile gloves,  sterile drape, hand hygiene and skin antiseptic. The right neck was anesthetized with 1% lidocaine. Small incision was made in the right neck with a blade. Micropuncture set was placed in the right internal jugular vein with ultrasound guidance. The micropuncture wire was used for measurement purposes. The right chest was anesthetized with 1% lidocaine. #15 blade was used to make an incision and a subcutaneous port pocket was formed. [place] was assembled. Subcutaneous tunnel was formed with a stiff tunneling device. The port catheter was brought through the subcutaneous tunnel. The port was placed in the subcutaneous pocket. The micropuncture set was exchanged for a peel-away sheath. The catheter was placed through the peel-away sheath and the tip was positioned at the superior cavoatrial junction. Catheter placement was confirmed with fluoroscopy. The port was accessed and flushed with heparinized saline. The port pocket was closed using two layers of absorbable sutures and Dermabond. The vein skin site was closed using a single layer of absorbable suture and Dermabond. Sterile dressings were applied. Patient tolerated the procedure well without an immediate complication. Ultrasound and fluoroscopic images were taken and saved for this procedure. IMPRESSION: Placement of a subcutaneous port device. Electronically Signed   By: Markus Daft M.D.   On: 09/10/2017 11:32   Ir Fluoro Guide Port Insertion Right  Result Date: 09/10/2017 INDICATION: 66 year old with malignant neoplasm of cervix. Port-A-Cath needed for treatment. EXAM: FLUOROSCOPIC AND ULTRASOUND GUIDED PLACEMENT OF A SUBCUTANEOUS PORT COMPARISON:  None. MEDICATIONS: Ancef 2 g; The antibiotic was administered within an appropriate time interval prior to skin puncture. ANESTHESIA/SEDATION: Versed 1.0 mg IV; Fentanyl 50 mcg IV; Moderate Sedation Time:  30 minutes The patient was continuously monitored during the procedure by the interventional  radiology nurse under my direct supervision. FLUOROSCOPY TIME:  18 seconds COMPLICATIONS: None immediate. PROCEDURE: The procedure, risks, benefits, and alternatives were explained to the patient. Questions regarding the procedure were encouraged and answered. The patient understands and consents to the procedure. Patient was placed supine on the interventional table. Ultrasound confirmed a patent right internal jugular vein. The right chest and neck were cleaned with a skin antiseptic and a sterile drape was placed. Maximal barrier sterile technique was utilized including caps, mask, sterile gowns, sterile gloves, sterile drape, hand hygiene and skin antiseptic. The right neck was anesthetized with 1% lidocaine. Small incision was made in the right neck with a blade. Micropuncture set was placed in the right internal jugular vein with ultrasound guidance. The micropuncture wire was used for measurement purposes. The right chest was anesthetized with 1% lidocaine. #15 blade was used to make an incision and a subcutaneous port pocket was formed. [place] was assembled. Subcutaneous tunnel was formed with a stiff tunneling device. The port catheter was brought through the subcutaneous tunnel. The port was placed in the subcutaneous pocket. The  micropuncture set was exchanged for a peel-away sheath. The catheter was placed through the peel-away sheath and the tip was positioned at the superior cavoatrial junction. Catheter placement was confirmed with fluoroscopy. The port was accessed and flushed with heparinized saline. The port pocket was closed using two layers of absorbable sutures and Dermabond. The vein skin site was closed using a single layer of absorbable suture and Dermabond. Sterile dressings were applied. Patient tolerated the procedure well without an immediate complication. Ultrasound and fluoroscopic images were taken and saved for this procedure. IMPRESSION: Placement of a subcutaneous port  device. Electronically Signed   By: Markus Daft M.D.   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[place]) She will received her last dose of chemotherapy this week The patient is developing neutropenia I would proceed with dose adjustment and recommend additional coverage with G-CSF  Leukopenia due to antineoplastic chemotherapy ([place]) I plan to reduce a dose of chemotherapy We discussed the risk and benefit of G-CSF support We discussed neutropenic precautions  Poor appetite She has poor appetite We discussed trial of Remeron but the patient declined I think there is a component of nausea and I recommend she take antiemetics on a regular basis  Chemotherapy-induced nausea We discussed the importance of taking antiemetics regularly   No orders of the defined types were placed in this encounter.  All questions were answered. The patient knows to call the clinic with any problems, questions or concerns. No barriers to learning was detected. I spent 15 minutes counseling the patient face to face. The total time spent in the appointment was 20 minutes and more than 50% was on counseling and review of test results     Heath Lark, MD 10/08/2017 12:05 PM

## 2017-10-10 ENCOUNTER — Ambulatory Visit
Admission: RE | Admit: 2017-10-10 | Discharge: 2017-10-10 | Disposition: A | Discharge status: Home / Self Care | Payer: Medicare HMO | Source: Ambulatory Visit | Attending: Radiation Oncology | Admitting: Radiation Oncology

## 2017-10-10 ENCOUNTER — Ambulatory Visit (HOSPITAL_BASED_OUTPATIENT_CLINIC_OR_DEPARTMENT_OTHER): Payer: Medicare HMO

## 2017-10-10 VITALS — BP 129/73 | HR 97 | Temp 98.5°F | Resp 16

## 2017-10-10 DIAGNOSIS — Z5111 Encounter for antineoplastic chemotherapy: Secondary | ICD-10-CM | POA: Diagnosis not present

## 2017-10-10 DIAGNOSIS — Z510 Encounter for antineoplastic radiation therapy: Secondary | ICD-10-CM | POA: Diagnosis not present

## 2017-10-10 DIAGNOSIS — C541 Malignant neoplasm of endometrium: Secondary | ICD-10-CM | POA: Diagnosis not present

## 2017-10-10 DIAGNOSIS — C539 Malignant neoplasm of cervix uteri, unspecified: Secondary | ICD-10-CM

## 2017-10-10 MED ORDER — FOSAPREPITANT DIMEGLUMINE INJECTION 150 MG
Freq: Once | INTRAVENOUS | Status: AC
  Administered 2017-10-10: 11:00:00 via INTRAVENOUS
  Filled 2017-10-10: qty 5

## 2017-10-10 MED ORDER — SODIUM CHLORIDE 0.9 % IV SOLN
Freq: Once | INTRAVENOUS | Status: AC
  Administered 2017-10-10: 08:00:00 via INTRAVENOUS

## 2017-10-10 MED ORDER — SODIUM CHLORIDE 0.9% FLUSH
10.0000 mL | INTRAVENOUS | Status: DC | PRN
  Administered 2017-10-10: 10 mL
  Filled 2017-10-10: qty 10

## 2017-10-10 MED ORDER — PALONOSETRON HCL INJECTION 0.25 MG/5ML
0.2500 mg | Freq: Once | INTRAVENOUS | Status: AC
  Administered 2017-10-10: 0.25 mg via INTRAVENOUS

## 2017-10-10 MED ORDER — POTASSIUM CHLORIDE 2 MEQ/ML IV SOLN
Freq: Once | INTRAVENOUS | Status: AC
  Administered 2017-10-10: 09:00:00 via INTRAVENOUS
  Filled 2017-10-10: qty 10

## 2017-10-10 MED ORDER — CISPLATIN CHEMO INJECTION 100MG/100ML
32.0000 mg/m2 | Freq: Once | INTRAVENOUS | Status: AC
  Administered 2017-10-10: 59 mg via INTRAVENOUS
  Filled 2017-10-10: qty 59

## 2017-10-10 MED ORDER — HEPARIN SOD (PORK) LOCK FLUSH 100 UNIT/ML IV SOLN
500.0000 [IU] | Freq: Once | INTRAVENOUS | Status: AC | PRN
  Administered 2017-10-10: 500 [IU]
  Filled 2017-10-10: qty 5

## 2017-10-10 MED ORDER — PALONOSETRON HCL INJECTION 0.25 MG/5ML
INTRAVENOUS | Status: AC
  Filled 2017-10-10: qty 5

## 2017-10-10 NOTE — Patient Instructions (Signed)
[facility] Discharge Instructions for Patients Receiving Chemotherapy  Today you received the following chemotherapy agents Cisplatin  To help prevent nausea and vomiting after your treatment, we encourage you to take your nausea medication as directed  If you develop nausea and vomiting that is not controlled by your nausea medication, call the clinic.   BELOW ARE SYMPTOMS THAT SHOULD BE REPORTED IMMEDIATELY:  *FEVER GREATER THAN 100.5 F  *CHILLS WITH OR WITHOUT FEVER  NAUSEA AND VOMITING THAT IS NOT CONTROLLED WITH YOUR NAUSEA MEDICATION  *UNUSUAL SHORTNESS OF BREATH  *UNUSUAL BRUISING OR BLEEDING  TENDERNESS IN MOUTH AND THROAT WITH OR WITHOUT PRESENCE OF ULCERS  *URINARY PROBLEMS  *BOWEL PROBLEMS  UNUSUAL RASH Items with * indicate a potential emergency and should be followed up as soon as possible.  Feel free to call the clinic should you have any questions or concerns. The clinic phone number is [phone].  Please show the CHEMO ALERT CARD at check-in to the Emergency Department and triage nurse.   

## 2017-10-11 ENCOUNTER — Ambulatory Visit (HOSPITAL_BASED_OUTPATIENT_CLINIC_OR_DEPARTMENT_OTHER): Payer: Medicare HMO

## 2017-10-11 ENCOUNTER — Ambulatory Visit
Admission: RE | Admit: 2017-10-11 | Discharge: 2017-10-11 | Disposition: A | Discharge status: Home / Self Care | Payer: Medicare HMO | Source: Ambulatory Visit | Attending: Radiation Oncology | Admitting: Radiation Oncology

## 2017-10-11 VITALS — BP 147/79 | HR 82 | Temp 98.5°F | Resp 19

## 2017-10-11 DIAGNOSIS — C541 Malignant neoplasm of endometrium: Secondary | ICD-10-CM | POA: Diagnosis not present

## 2017-10-11 DIAGNOSIS — C530 Malignant neoplasm of endocervix: Secondary | ICD-10-CM

## 2017-10-11 DIAGNOSIS — D701 Agranulocytosis secondary to cancer chemotherapy: Secondary | ICD-10-CM

## 2017-10-11 DIAGNOSIS — Z510 Encounter for antineoplastic radiation therapy: Secondary | ICD-10-CM | POA: Diagnosis not present

## 2017-10-11 MED ORDER — TBO-FILGRASTIM 300 MCG/0.5ML ~~LOC~~ SOSY
300.0000 ug | PREFILLED_SYRINGE | Freq: Once | SUBCUTANEOUS | Status: AC
  Administered 2017-10-11: 300 ug via SUBCUTANEOUS

## 2017-10-11 MED ORDER — TBO-FILGRASTIM 300 MCG/0.5ML ~~LOC~~ SOSY
PREFILLED_SYRINGE | SUBCUTANEOUS | Status: AC
  Filled 2017-10-11: qty 0.5

## 2017-10-11 NOTE — Patient Instructions (Signed)
Tbo-Filgrastim injection What is this medicine? TBO-FILGRASTIM (T B O fil GRA stim) is a granulocyte colony-stimulating factor that stimulates the growth of neutrophils, a type of white blood cell important in the body's fight against infection. It is used to reduce the incidence of fever and infection in patients with certain types of cancer who are receiving chemotherapy that affects the bone marrow. This medicine may be used for other purposes; ask your health care provider or pharmacist if you have questions. COMMON BRAND NAME(S): Granix What should I tell my health care provider before I take this medicine? They need to know if you have any of these conditions: -bone scan or tests planned -kidney disease -sickle cell anemia -an unusual or allergic reaction to tbo-filgrastim, filgrastim, pegfilgrastim, other medicines, foods, dyes, or preservatives -pregnant or trying to get pregnant -breast-feeding How should I use this medicine? This medicine is for injection under the skin. If you get this medicine at home, you will be taught how to prepare and give this medicine. Refer to the Instructions for Use that come with your medication packaging. Use exactly as directed. Take your medicine at regular intervals. Do not take your medicine more often than directed. It is important that you put your used needles and syringes in a special sharps container. Do not put them in a trash can. If you do not have a sharps container, call your pharmacist or healthcare provider to get one. Talk to your pediatrician regarding the use of this medicine in children. Special care may be needed. Overdosage: If you think you have taken too much of this medicine contact a poison control center or emergency room at once. NOTE: This medicine is only for you. Do not share this medicine with others. What if I miss a dose? It is important not to miss your dose. Call your doctor or health care professional if you miss a  dose. What may interact with this medicine? This medicine may interact with the following medications: -medicines that may cause a release of neutrophils, such as lithium This list may not describe all possible interactions. Give your health care provider a list of all the medicines, herbs, non-prescription drugs, or dietary supplements you use. Also tell them if you smoke, drink alcohol, or use illegal drugs. Some items may interact with your medicine. What should I watch for while using this medicine? You may need blood work done while you are taking this medicine. What side effects may I notice from receiving this medicine? Side effects that you should report to your doctor or health care professional as soon as possible: -allergic reactions like skin rash, itching or hives, swelling of the face, lips, or tongue -blood in the urine -dark urine -dizziness -fast heartbeat -feeling faint -shortness of breath or breathing problems -signs and symptoms of infection like fever or chills; cough; or sore throat -signs and symptoms of kidney injury like trouble passing urine or change in the amount of urine -stomach or side pain, or pain at the shoulder -sweating -swelling of the legs, ankles, or abdomen -tiredness Side effects that usually do not require medical attention (report to your doctor or health care professional if they continue or are bothersome): -bone pain -headache -muscle pain -vomiting This list may not describe all possible side effects. Call your doctor for medical advice about side effects. You may report side effects to FDA at 1-800-FDA-1088. Where should I keep my medicine? Keep out of the reach of children. Store in a refrigerator between   2 and 8 degrees C (36 and 46 degrees F). Keep in carton to protect from light. Throw away this medicine if it is left out of the refrigerator for more than 5 consecutive days. Throw away any unused medicine after the expiration  date. NOTE: This sheet is a summary. It may not cover all possible information. If you have questions about this medicine, talk to your doctor, pharmacist, or health care provider.  2018 [employer]vier/Gold Standard (2015-11-22 19:07:04)  

## 2017-10-12 ENCOUNTER — Ambulatory Visit (HOSPITAL_BASED_OUTPATIENT_CLINIC_OR_DEPARTMENT_OTHER): Payer: Medicare HMO

## 2017-10-12 ENCOUNTER — Ambulatory Visit
Admission: RE | Admit: 2017-10-12 | Discharge: 2017-10-12 | Disposition: A | Discharge status: Home / Self Care | Payer: Medicare HMO | Source: Ambulatory Visit | Attending: Radiation Oncology | Admitting: Radiation Oncology

## 2017-10-12 VITALS — BP 127/68 | HR 99 | Temp 98.6°F | Resp 20

## 2017-10-12 DIAGNOSIS — D701 Agranulocytosis secondary to cancer chemotherapy: Secondary | ICD-10-CM

## 2017-10-12 DIAGNOSIS — Z510 Encounter for antineoplastic radiation therapy: Secondary | ICD-10-CM | POA: Diagnosis not present

## 2017-10-12 DIAGNOSIS — C541 Malignant neoplasm of endometrium: Secondary | ICD-10-CM

## 2017-10-12 DIAGNOSIS — C530 Malignant neoplasm of endocervix: Secondary | ICD-10-CM

## 2017-10-12 MED ORDER — TBO-FILGRASTIM 300 MCG/0.5ML ~~LOC~~ SOSY
PREFILLED_SYRINGE | SUBCUTANEOUS | Status: AC
  Filled 2017-10-12: qty 0.5

## 2017-10-12 MED ORDER — TBO-FILGRASTIM 300 MCG/0.5ML ~~LOC~~ SOSY
300.0000 ug | PREFILLED_SYRINGE | Freq: Once | SUBCUTANEOUS | Status: AC
  Administered 2017-10-12: 300 ug via SUBCUTANEOUS

## 2017-10-12 NOTE — Patient Instructions (Signed)
Tbo-Filgrastim injection What is this medicine? TBO-FILGRASTIM (T B O fil GRA stim) is a granulocyte colony-stimulating factor that stimulates the growth of neutrophils, a type of white blood cell important in the body's fight against infection. It is used to reduce the incidence of fever and infection in patients with certain types of cancer who are receiving chemotherapy that affects the bone marrow. This medicine may be used for other purposes; ask your health care provider or pharmacist if you have questions. COMMON BRAND NAME(S): Granix What should I tell my health care provider before I take this medicine? They need to know if you have any of these conditions: -bone scan or tests planned -kidney disease -sickle cell anemia -an unusual or allergic reaction to tbo-filgrastim, filgrastim, pegfilgrastim, other medicines, foods, dyes, or preservatives -pregnant or trying to get pregnant -breast-feeding How should I use this medicine? This medicine is for injection under the skin. If you get this medicine at home, you will be taught how to prepare and give this medicine. Refer to the Instructions for Use that come with your medication packaging. Use exactly as directed. Take your medicine at regular intervals. Do not take your medicine more often than directed. It is important that you put your used needles and syringes in a special sharps container. Do not put them in a trash can. If you do not have a sharps container, call your pharmacist or healthcare provider to get one. Talk to your pediatrician regarding the use of this medicine in children. Special care may be needed. Overdosage: If you think you have taken too much of this medicine contact a poison control center or emergency room at once. NOTE: This medicine is only for you. Do not share this medicine with others. What if I miss a dose? It is important not to miss your dose. Call your doctor or health care professional if you miss a  dose. What may interact with this medicine? This medicine may interact with the following medications: -medicines that may cause a release of neutrophils, such as lithium This list may not describe all possible interactions. Give your health care provider a list of all the medicines, herbs, non-prescription drugs, or dietary supplements you use. Also tell them if you smoke, drink alcohol, or use illegal drugs. Some items may interact with your medicine. What should I watch for while using this medicine? You may need blood work done while you are taking this medicine. What side effects may I notice from receiving this medicine? Side effects that you should report to your doctor or health care professional as soon as possible: -allergic reactions like skin rash, itching or hives, swelling of the face, lips, or tongue -blood in the urine -dark urine -dizziness -fast heartbeat -feeling faint -shortness of breath or breathing problems -signs and symptoms of infection like fever or chills; cough; or sore throat -signs and symptoms of kidney injury like trouble passing urine or change in the amount of urine -stomach or side pain, or pain at the shoulder -sweating -swelling of the legs, ankles, or abdomen -tiredness Side effects that usually do not require medical attention (report to your doctor or health care professional if they continue or are bothersome): -bone pain -headache -muscle pain -vomiting This list may not describe all possible side effects. Call your doctor for medical advice about side effects. You may report side effects to FDA at 1-800-FDA-1088. Where should I keep my medicine? Keep out of the reach of children. Store in a refrigerator between   2 and 8 degrees C (36 and 46 degrees F). Keep in carton to protect from light. Throw away this medicine if it is left out of the refrigerator for more than 5 consecutive days. Throw away any unused medicine after the expiration  date. NOTE: This sheet is a summary. It may not cover all possible information. If you have questions about this medicine, talk to your doctor, pharmacist, or health care provider.  2018 [employer]vier/Gold Standard (2015-11-22 19:07:04)  

## 2017-10-15 ENCOUNTER — Other Ambulatory Visit (HOSPITAL_BASED_OUTPATIENT_CLINIC_OR_DEPARTMENT_OTHER): Payer: Medicare HMO

## 2017-10-15 ENCOUNTER — Ambulatory Visit
Admission: RE | Admit: 2017-10-15 | Discharge: 2017-10-15 | Disposition: A | Discharge status: Home / Self Care | Payer: Medicare HMO | Source: Ambulatory Visit | Attending: Radiation Oncology | Admitting: Radiation Oncology

## 2017-10-15 DIAGNOSIS — Z510 Encounter for antineoplastic radiation therapy: Secondary | ICD-10-CM | POA: Diagnosis not present

## 2017-10-15 DIAGNOSIS — C539 Malignant neoplasm of cervix uteri, unspecified: Secondary | ICD-10-CM

## 2017-10-15 DIAGNOSIS — R918 Other nonspecific abnormal finding of lung field: Secondary | ICD-10-CM

## 2017-10-15 DIAGNOSIS — D701 Agranulocytosis secondary to cancer chemotherapy: Secondary | ICD-10-CM | POA: Diagnosis not present

## 2017-10-15 DIAGNOSIS — C541 Malignant neoplasm of endometrium: Secondary | ICD-10-CM | POA: Diagnosis not present

## 2017-10-15 LAB — MAGNESIUM: MAGNESIUM: 1.5 mg/dL (ref 1.5–2.5)

## 2017-10-15 LAB — CBC WITH DIFFERENTIAL/PLATELET
BASO%: 0.3 % (ref 0.0–2.0)
BASOS ABS: 0 10*3/uL (ref 0.0–0.1)
EOS ABS: 0 10*3/uL (ref 0.0–0.5)
EOS%: 1.2 % (ref 0.0–7.0)
HEMATOCRIT: 35.6 % (ref 34.8–46.6)
HEMOGLOBIN: 11.9 g/dL (ref 11.6–15.9)
LYMPH#: 0.2 10*3/uL — AB (ref 0.9–3.3)
LYMPH%: 11.2 % — ABNORMAL LOW (ref 14.0–49.7)
MCH: 28.9 pg (ref 25.1–34.0)
MCHC: 33.4 g/dL (ref 31.5–36.0)
MCV: 86.7 fL (ref 79.5–101.0)
MONO#: 0.2 10*3/uL (ref 0.1–0.9)
MONO%: 13.2 % (ref 0.0–14.0)
NEUT#: 1.1 10*3/uL — ABNORMAL LOW (ref 1.5–6.5)
NEUT%: 74.1 % (ref 38.4–76.8)
Platelets: 104 10*3/uL — ABNORMAL LOW (ref 145–400)
RBC: 4.11 10*6/uL (ref 3.70–5.45)
RDW: 12.5 % (ref 11.2–14.5)
WBC: 1.5 10*3/uL — ABNORMAL LOW (ref 3.9–10.3)

## 2017-10-15 LAB — COMPREHENSIVE METABOLIC PANEL
ALT: 19 U/L (ref 0–55)
ANION GAP: 10 meq/L (ref 3–11)
AST: 17 U/L (ref 5–34)
Albumin: 3.7 g/dL (ref 3.5–5.0)
Alkaline Phosphatase: 95 U/L (ref 40–150)
BUN: 10.8 mg/dL (ref 7.0–26.0)
CHLORIDE: 103 meq/L (ref 98–109)
CO2: 28 meq/L (ref 22–29)
Calcium: 9.3 mg/dL (ref 8.4–10.4)
Creatinine: 1.1 mg/dL (ref 0.6–1.1)
EGFR: 55 mL/min/{1.73_m2} — AB (ref >60)
Glucose: 105 mg/dl (ref 70–140)
Potassium: 4.2 mEq/L (ref 3.5–5.1)
Sodium: 141 mEq/L (ref 136–145)
Total Bilirubin: 0.29 mg/dL (ref 0.20–1.20)
Total Protein: 7.1 g/dL (ref 6.4–8.3)

## 2017-10-17 ENCOUNTER — Encounter (HOSPITAL_BASED_OUTPATIENT_CLINIC_OR_DEPARTMENT_OTHER): Payer: Self-pay | Admitting: *Deleted

## 2017-10-17 ENCOUNTER — Ambulatory Visit
Admission: RE | Admit: 2017-10-17 | Discharge: 2017-10-17 | Disposition: A | Discharge status: Home / Self Care | Payer: Medicare HMO | Source: Ambulatory Visit | Attending: Radiation Oncology | Admitting: Radiation Oncology

## 2017-10-17 DIAGNOSIS — Z510 Encounter for antineoplastic radiation therapy: Secondary | ICD-10-CM | POA: Diagnosis not present

## 2017-10-18 ENCOUNTER — Telehealth: Payer: Self-pay | Admitting: Hematology and Oncology

## 2017-10-18 ENCOUNTER — Ambulatory Visit
Admission: RE | Admit: 2017-10-18 | Discharge: 2017-10-18 | Disposition: A | Discharge status: Home / Self Care | Payer: Medicare HMO | Source: Ambulatory Visit | Attending: Radiation Oncology | Admitting: Radiation Oncology

## 2017-10-18 ENCOUNTER — Encounter: Payer: Self-pay | Admitting: Hematology and Oncology

## 2017-10-18 ENCOUNTER — Other Ambulatory Visit: Payer: Self-pay | Admitting: Radiation Oncology

## 2017-10-18 ENCOUNTER — Ambulatory Visit (HOSPITAL_BASED_OUTPATIENT_CLINIC_OR_DEPARTMENT_OTHER): Payer: Medicare HMO | Admitting: Hematology and Oncology

## 2017-10-18 VITALS — BP 142/68 | HR 102 | Temp 98.5°F | Resp 18 | Ht 65.0 in | Wt 154.3 lb

## 2017-10-18 DIAGNOSIS — T451X5A Adverse effect of antineoplastic and immunosuppressive drugs, initial encounter: Secondary | ICD-10-CM

## 2017-10-18 DIAGNOSIS — C530 Malignant neoplasm of endocervix: Secondary | ICD-10-CM

## 2017-10-18 DIAGNOSIS — D701 Agranulocytosis secondary to cancer chemotherapy: Secondary | ICD-10-CM

## 2017-10-18 DIAGNOSIS — R110 Nausea: Secondary | ICD-10-CM

## 2017-10-18 DIAGNOSIS — C539 Malignant neoplasm of cervix uteri, unspecified: Secondary | ICD-10-CM

## 2017-10-18 DIAGNOSIS — Z510 Encounter for antineoplastic radiation therapy: Secondary | ICD-10-CM | POA: Diagnosis not present

## 2017-10-18 NOTE — Progress Notes (Signed)
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 Examination revealed abnormal cervix. Vaginal ultrasound showed a 10 cm uterus with an intramural fibroid, left ovary is slightly enlarged measuring 2.1 x 1.5 x 2.3 cm which is described as solid complex with a vascular flow. In addition the cervix is imaged showing a solid vascular mass measuring 2.7 x 2.1 x 2.2 cm. A sonohysterogram showed no abnormalities in the endometrium. Patient had a Pap smear showing high-grade squamous intraepithelial lesion with features suspicious for invasion as well as atypical glandular cells. 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      08/29/2017 PET scan    1. Cervical malignancy with maximum SUV 6.0, dominant cervical mass measuring 6.2 by 6.0 by 7.3 cm. 2. 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 3. There 12 scattered small pulmonary nodules measuring up to 5 mm in diameter. No hypermetabolic activity but these are below sensitive PET-CT size thresholds. These could be postinflammatory or less likely neoplastic, surveillance is recommended. 4. Hypodense thyroid nodules are not hypermetabolic which strongly favors benign etiology.      09/10/2017 Procedure    Placement of a subcutaneous port device.      09/12/2017 -  Chemotherapy    The patient had palonosetron (ALOXI) injection 0.25 mg, 0.25 mg, Intravenous,  Once, 1 of 5  cycles  Administration: 0.25 mg (09/12/2017)    CISplatin (PLATINOL) 70 mg in sodium chloride 0.9 % 250 mL chemo infusion, 67 mg (90 % of original dose 40 mg/m2), Intravenous,  Once, 1 of 5 cycles  Dose modification: 36 mg/m2 (90 % of original dose 40 mg/m2, Cycle 1, Reason: Other (see comments), Comment: cap at maximum 70 mg)  Administration: 70 mg (09/12/2017)    fosaprepitant (EMEND) 150 mg, dexamethasone (DECADRON) 12 mg in sodium chloride 0.9 % 145 mL IVPB, , Intravenous,  Once, 1 of 5 cycles  Administration:  (09/12/2017)  for chemotherapy treatment.  She received weekly cisplatin      09/12/2017 -  Radiation Therapy    She had radiation treatment       INTERVAL HISTORY: Please see below for problem oriented charting. She returns for further follow-up She tolerated recent chemotherapy well Denies tinnitus or peripheral neuropathy She has intermittent nausea and loose bowel movement, well controlled with antiemetics She denies recent fever or chills No recent signs of infection  REVIEW OF SYSTEMS:   Constitutional: Denies fevers, chills or abnormal weight loss Eyes: Denies blurriness of vision Ears, nose, mouth, throat, and face: Denies mucositis or sore throat Respiratory: Denies cough, dyspnea or wheezes Cardiovascular: Denies palpitation, chest discomfort or lower extremity swelling Skin: Denies abnormal skin rashes Lymphatics: Denies new lymphadenopathy or easy bruising Neurological:Denies numbness, tingling or new weaknesses Behavioral/Psych: Mood is stable, no new changes  All other systems were reviewed with the patient and are negative.  I have reviewed the past medical history, past surgical history, social history and family history with the patient and they are unchanged from previous note.  ALLERGIES:  has No Known Allergies.  MEDICATIONS:  Current Outpatient Medications  Medication Sig Dispense Refill  . lidocaine-prilocaine (EMLA) cream Apply  to affected area once 30 g 3  . ondansetron (ZOFRAN) 8 MG tablet Take 1 tablet (8 mg total) by mouth 2 (two) times daily as needed. Start on the third day after chemotherapy. 30 tablet 1  . prochlorperazine (COMPAZINE) 10 MG tablet Take 1 tablet (10 mg total) by mouth every 6 (six) hours as needed (Nausea or vomiting). 30 tablet 1   No current facility-administered medications for this visit.     PHYSICAL EXAMINATION: ECOG PERFORMANCE STATUS: 1 - Symptomatic but completely ambulatory  Vitals:   10/18/17 0823  BP: (!) 142/68  Pulse: (!) 102  Resp: 18  Temp: 98.5 F (36.9 C)  SpO2: 100%   Filed Weights   10/18/17 0823  Weight: 154 lb 4.8 oz (70 kg)    GENERAL:alert, no distress and comfortable SKIN: skin color, texture, turgor are normal, no rashes or significant lesions EYES: normal, Conjunctiva are pink and non-injected, sclera clear OROPHARYNX:no exudate, no erythema and lips, buccal mucosa, and tongue normal  NECK: supple, thyroid normal size, non-tender, without nodularity LYMPH:  no palpable lymphadenopathy in the cervical, axillary or inguinal LUNGS: clear to auscultation and percussion with normal breathing effort HEART: regular rate & rhythm and no murmurs and no lower extremity edema ABDOMEN:abdomen soft, non-tender and normal bowel sounds Musculoskeletal:no cyanosis of digits and no clubbing  NEURO: alert & oriented x 3 with fluent speech, no focal motor/sensory deficits  LABORATORY DATA:  I have reviewed the data as listed    Component Value Date/Time   NA 141 10/15/2017 0900   K 4.2 10/15/2017 0900   CL 104 09/10/2017 0834   CO2 28 10/15/2017 0900   GLUCOSE 105 10/15/2017 0900   BUN 10.8 10/15/2017 0900   CREATININE 1.1 10/15/2017 0900   CALCIUM 9.3 10/15/2017 0900   PROT 7.1 10/15/2017 0900   ALBUMIN 3.7 10/15/2017 0900   AST 17 10/15/2017 0900   ALT 19 10/15/2017 0900   ALKPHOS 95 10/15/2017 0900   BILITOT 0.29 10/15/2017 0900   GFRNONAA 56 (L)  09/10/2017 0834   GFRAA >60 09/10/2017 0834    No results found for: SPEP, UPEP  Lab Results  Component Value Date   WBC 1.5 (L) 10/15/2017   NEUTROABS 1.1 (L) 10/15/2017   HGB 11.9 10/15/2017   HCT 35.6 10/15/2017   MCV 86.7 10/15/2017   PLT 104 (L) 10/15/2017      Chemistry      Component Value Date/Time   NA 141 10/15/2017 0900   K 4.2 10/15/2017 0900   CL 104 09/10/2017 0834   CO2 28 10/15/2017 0900   BUN 10.8 10/15/2017 0900   CREATININE 1.1 10/15/2017 0900      Component Value Date/Time   CALCIUM 9.3 10/15/2017 0900   ALKPHOS 95 10/15/2017 0900   AST 17 10/15/2017 0900   ALT 19 10/15/2017 0900   BILITOT 0.29 10/15/2017 0900      ASSESSMENT & PLAN:  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[place]) She has completed chemotherapy and is undergoing radiation treatment Despite having some mild pancytopenia, she is asymptomatic She will continue weekly blood draw until radiation is completed I plan to maintain port patency until her next PET/CT scan which I plan to schedule around March, to assess response to treatment and to evaluate for lung nodules changes  Leukopenia due to antineoplastic chemotherapy [facility]) She has mild pancytopenia  from treatment and had received G-CSF support She is not symptomatic I recommend neutropenic precaution We will continue close monitoring of her blood count over the next few weeks  Chemotherapy-induced nausea We discussed the importance of taking antiemetics regularly   Orders Placed This Encounter  Procedures  . NM PET Image Restag (PS) Skull Base To Thigh    Standing Status:   Future    Standing Expiration Date:   10/18/2018    Order Specific Question:   If indicated for the ordered procedure, I authorize the administration of a radiopharmaceutical per Radiology protocol    Answer:   Yes    Order Specific Question:   Preferred imaging location?    Answer:   [facility]    Order Specific Question:   Radiology Contrast Protocol -  do NOT remove file path    Answer:   [email or web address]   All questions were answered. The patient knows to call the clinic with any problems, questions or concerns. No barriers to learning was detected. I spent 15 minutes counseling the patient face to face. The total time spent in the appointment was 20 minutes and more than 50% was on counseling and review of test results     Heath Lark, MD 10/18/2017 9:50 AM

## 2017-10-18 NOTE — Assessment & Plan Note (Signed)
She has completed chemotherapy and is undergoing radiation treatment Despite having some mild pancytopenia, she is asymptomatic She will continue weekly blood draw until radiation is completed I plan to maintain port patency until her next PET/CT scan which I plan to schedule around March, to assess response to treatment and to evaluate for lung nodules changes

## 2017-10-18 NOTE — Assessment & Plan Note (Signed)
We discussed the importance of taking antiemetics regularly

## 2017-10-18 NOTE — Telephone Encounter (Signed)
Gave patient AVS and calendar of upcoming January through March appointments.  °

## 2017-10-18 NOTE — Assessment & Plan Note (Signed)
She has mild pancytopenia from treatment and had received G-CSF support She is not symptomatic I recommend neutropenic precaution We will continue close monitoring of her blood count over the next few weeks

## 2017-10-19 ENCOUNTER — Ambulatory Visit: Payer: Medicare HMO

## 2017-10-19 ENCOUNTER — Ambulatory Visit
Admission: RE | Admit: 2017-10-19 | Discharge: 2017-10-19 | Disposition: A | Discharge status: Home / Self Care | Payer: Medicare HMO | Source: Ambulatory Visit | Attending: Radiation Oncology | Admitting: Radiation Oncology

## 2017-10-19 DIAGNOSIS — Z510 Encounter for antineoplastic radiation therapy: Secondary | ICD-10-CM | POA: Diagnosis not present

## 2017-10-22 ENCOUNTER — Telehealth: Payer: Self-pay | Admitting: *Deleted

## 2017-10-22 ENCOUNTER — Inpatient Hospital Stay: Payer: Medicare HMO | Attending: Hematology and Oncology

## 2017-10-22 ENCOUNTER — Ambulatory Visit: Payer: Medicare HMO

## 2017-10-22 ENCOUNTER — Encounter (HOSPITAL_BASED_OUTPATIENT_CLINIC_OR_DEPARTMENT_OTHER): Payer: Self-pay | Admitting: *Deleted

## 2017-10-22 ENCOUNTER — Other Ambulatory Visit: Payer: Self-pay

## 2017-10-22 ENCOUNTER — Ambulatory Visit
Admission: RE | Admit: 2017-10-22 | Discharge: 2017-10-22 | Disposition: A | Discharge status: Home / Self Care | Payer: Medicare HMO | Source: Ambulatory Visit | Attending: Radiation Oncology | Admitting: Radiation Oncology

## 2017-10-22 DIAGNOSIS — R918 Other nonspecific abnormal finding of lung field: Secondary | ICD-10-CM

## 2017-10-22 DIAGNOSIS — C539 Malignant neoplasm of cervix uteri, unspecified: Secondary | ICD-10-CM | POA: Diagnosis present

## 2017-10-22 DIAGNOSIS — Z510 Encounter for antineoplastic radiation therapy: Secondary | ICD-10-CM | POA: Diagnosis not present

## 2017-10-22 DIAGNOSIS — Z79899 Other long term (current) drug therapy: Secondary | ICD-10-CM | POA: Diagnosis not present

## 2017-10-22 LAB — COMPREHENSIVE METABOLIC PANEL
ALBUMIN: 3.7 g/dL (ref 3.5–5.0)
ALK PHOS: 79 U/L (ref 40–150)
ALT: 16 U/L (ref 0–55)
AST: 18 U/L (ref 5–34)
Anion gap: 8 (ref 3–11)
BUN: 9 mg/dL (ref 7–26)
CALCIUM: 9.4 mg/dL (ref 8.4–10.4)
CO2: 25 mmol/L (ref 22–29)
Chloride: 107 mmol/L (ref 98–109)
Creatinine, Ser: 0.97 mg/dL (ref 0.60–1.10)
GFR calc Af Amer: >60 mL/min (ref >60)
GFR calc non Af Amer: 59 mL/min — ABNORMAL LOW (ref >60)
GLUCOSE: 107 mg/dL (ref 70–140)
Potassium: 4.1 mmol/L (ref 3.3–4.7)
SODIUM: 140 mmol/L (ref 136–145)
Total Bilirubin: 0.3 mg/dL (ref 0.2–1.2)
Total Protein: 7.1 g/dL (ref 6.4–8.3)

## 2017-10-22 LAB — MAGNESIUM: Magnesium: 1.9 mg/dL (ref 1.5–2.5)

## 2017-10-22 NOTE — Telephone Encounter (Signed)
Notified of low WBC. Reviewed neutropenic precautions

## 2017-10-22 NOTE — Progress Notes (Signed)
Spoke with Amy, RN in Infusion Room at [facility] and she stated that if patient is allergic to Tegaderm then OPSITE dressing is used.  Jeral Fruit , RN made aware and note on preop for [facility].

## 2017-10-22 NOTE — Progress Notes (Signed)
SPOKE W/ PT VIA PHONE FOR PRE-OP INTERVIEW.  NPO AFTER MN.  ARRIVE AT 0730.  CURRENT LAB RESULTS IN CHART AND Epic.  MAY TAKE ONE TYPE NAUSEA MED IF NEEDED AM DOS W/ SIPS OF WATER.

## 2017-10-23 ENCOUNTER — Ambulatory Visit: Payer: Medicare HMO

## 2017-10-23 ENCOUNTER — Telehealth: Payer: Self-pay | Admitting: Oncology

## 2017-10-23 ENCOUNTER — Ambulatory Visit
Admission: RE | Admit: 2017-10-23 | Discharge: 2017-10-23 | Disposition: A | Discharge status: Home / Self Care | Payer: Medicare HMO | Source: Ambulatory Visit | Attending: Radiation Oncology | Admitting: Radiation Oncology

## 2017-10-23 DIAGNOSIS — Z510 Encounter for antineoplastic radiation therapy: Secondary | ICD-10-CM | POA: Diagnosis not present

## 2017-10-23 DIAGNOSIS — C530 Malignant neoplasm of endocervix: Secondary | ICD-10-CM

## 2017-10-23 LAB — CBC WITH DIFFERENTIAL/PLATELET
ABS GRANULOCYTE: 0.6 10*3/uL — AB (ref 1.5–6.5)
BASOS ABS: 0 10*3/uL (ref 0.0–0.1)
Basophils Relative: 1 %
EOS PCT: 2 %
Eosinophils Absolute: 0 10*3/uL (ref 0.0–0.5)
HEMATOCRIT: 31.2 % — AB (ref 34.8–46.6)
HEMOGLOBIN: 10.7 g/dL — AB (ref 11.6–15.9)
LYMPHS ABS: 0.2 10*3/uL — AB (ref 0.9–3.3)
Lymphocytes Relative: 26 %
MCH: 29.6 pg (ref 25.1–34.0)
MCHC: 34.2 g/dL (ref 31.5–36.0)
MCV: 86.6 fL (ref 79.5–101.0)
Monocytes Absolute: 0.1 10*3/uL (ref 0.1–0.9)
Monocytes Relative: 9 %
NEUTROS PCT: 62 %
Neutro Abs: 0.6 10*3/uL — ABNORMAL LOW (ref 1.5–6.5)
PLATELETS: 103 10*3/uL — AB (ref 145–400)
RBC: 3.6 MIL/uL — ABNORMAL LOW (ref 3.70–5.45)
RDW: 12.8 % (ref 11.2–16.1)
WBC: 0.9 10*3/uL — CL (ref 3.9–10.3)

## 2017-10-23 NOTE — Telephone Encounter (Signed)
Called patient and advised her of lab appointment for Monday, 10/29/17 at 8:45 to recheck CBC.  She verbalized agreement.

## 2017-10-24 ENCOUNTER — Ambulatory Visit
Admission: RE | Admit: 2017-10-24 | Discharge: 2017-10-24 | Disposition: A | Discharge status: Home / Self Care | Payer: Medicare HMO | Source: Ambulatory Visit | Attending: Radiation Oncology | Admitting: Radiation Oncology

## 2017-10-24 ENCOUNTER — Ambulatory Visit: Payer: Medicare HMO

## 2017-10-24 DIAGNOSIS — Z510 Encounter for antineoplastic radiation therapy: Secondary | ICD-10-CM | POA: Diagnosis not present

## 2017-10-25 ENCOUNTER — Ambulatory Visit: Payer: Medicare HMO

## 2017-10-25 ENCOUNTER — Ambulatory Visit
Admission: RE | Admit: 2017-10-25 | Discharge: 2017-10-25 | Disposition: A | Discharge status: Home / Self Care | Payer: Medicare HMO | Source: Ambulatory Visit | Attending: Radiation Oncology | Admitting: Radiation Oncology

## 2017-10-25 DIAGNOSIS — Z510 Encounter for antineoplastic radiation therapy: Secondary | ICD-10-CM | POA: Diagnosis not present

## 2017-10-26 ENCOUNTER — Ambulatory Visit
Admission: RE | Admit: 2017-10-26 | Discharge: 2017-10-26 | Disposition: A | Discharge status: Home / Self Care | Payer: Medicare HMO | Source: Ambulatory Visit | Attending: Radiation Oncology | Admitting: Radiation Oncology

## 2017-10-26 ENCOUNTER — Ambulatory Visit: Payer: Medicare HMO

## 2017-10-26 DIAGNOSIS — Z510 Encounter for antineoplastic radiation therapy: Secondary | ICD-10-CM | POA: Diagnosis not present

## 2017-10-29 ENCOUNTER — Telehealth: Payer: Self-pay | Admitting: Hematology and Oncology

## 2017-10-29 ENCOUNTER — Ambulatory Visit: Payer: Medicare HMO

## 2017-10-29 ENCOUNTER — Telehealth: Payer: Self-pay

## 2017-10-29 ENCOUNTER — Inpatient Hospital Stay: Payer: Medicare HMO

## 2017-10-29 ENCOUNTER — Ambulatory Visit
Admission: RE | Admit: 2017-10-29 | Discharge: 2017-10-29 | Disposition: A | Discharge status: Home / Self Care | Payer: Medicare HMO | Source: Ambulatory Visit | Attending: Radiation Oncology | Admitting: Radiation Oncology

## 2017-10-29 DIAGNOSIS — C539 Malignant neoplasm of cervix uteri, unspecified: Secondary | ICD-10-CM | POA: Diagnosis not present

## 2017-10-29 DIAGNOSIS — Z510 Encounter for antineoplastic radiation therapy: Secondary | ICD-10-CM | POA: Diagnosis not present

## 2017-10-29 DIAGNOSIS — C530 Malignant neoplasm of endocervix: Secondary | ICD-10-CM

## 2017-10-29 LAB — CBC WITH DIFFERENTIAL (CANCER CENTER ONLY)
BASOS ABS: 0 10*3/uL (ref 0.0–0.1)
BASOS PCT: 1 %
EOS PCT: 2 %
Eosinophils Absolute: 0 10*3/uL (ref 0.0–0.5)
HCT: 30.5 % — ABNORMAL LOW (ref 34.8–46.6)
Hemoglobin: 10.3 g/dL — ABNORMAL LOW (ref 11.6–15.9)
Lymphocytes Relative: 33 %
Lymphs Abs: 0.3 10*3/uL — ABNORMAL LOW (ref 0.9–3.3)
MCH: 29.8 pg (ref 25.1–34.0)
MCHC: 33.7 g/dL (ref 31.5–36.0)
MCV: 88.3 fL (ref 79.5–101.0)
MONO ABS: 0.2 10*3/uL (ref 0.1–0.9)
Monocytes Relative: 25 %
Neutro Abs: 0.4 10*3/uL — CL (ref 1.5–6.5)
Neutrophils Relative %: 39 %
PLATELETS: 209 10*3/uL (ref 145–400)
RBC: 3.46 MIL/uL — ABNORMAL LOW (ref 3.70–5.45)
RDW: 16.4 % — ABNORMAL HIGH (ref 11.2–16.1)
WBC Count: 0.9 10*3/uL — CL (ref 3.9–10.3)

## 2017-10-29 MED ORDER — TBO-FILGRASTIM 480 MCG/0.8ML ~~LOC~~ SOSY
480.0000 ug | PREFILLED_SYRINGE | Freq: Once | SUBCUTANEOUS | Status: AC
  Administered 2017-10-29: 480 ug via SUBCUTANEOUS

## 2017-10-29 NOTE — Telephone Encounter (Signed)
Spoke to patient regarding upcoming January appointments per 1/14 sch message  °

## 2017-10-29 NOTE — Telephone Encounter (Signed)
I have reviewed the CBC with the radiation oncologist Due to risk of infection, she needs to be started on G-CSF support to keep [place] normal for future radiation treatment. I will contact the patient to set up G-CSF support.

## 2017-10-29 NOTE — Telephone Encounter (Signed)
Called patient to see if she could come in today and the next 2 days for Granix injection. She said yes, scheduling message sent.

## 2017-10-29 NOTE — Telephone Encounter (Signed)
Called and given CBC results for today per Dr. Alvy Bimler. She is doing well with no complaints, denies fever. Instructed to call to be seen by Dr. Alvy Bimler if anything changes. Instructed to continue to follow neutropenic precautions. Verbalized understanding.  Scheduling message sent for labs on 1/21.

## 2017-10-29 NOTE — Patient Instructions (Signed)
Tbo-Filgrastim injection What is this medicine? TBO-FILGRASTIM (T B O fil GRA stim) is a granulocyte colony-stimulating factor that stimulates the growth of neutrophils, a type of white blood cell important in the body's fight against infection. It is used to reduce the incidence of fever and infection in patients with certain types of cancer who are receiving chemotherapy that affects the bone marrow. This medicine may be used for other purposes; ask your health care provider or pharmacist if you have questions. COMMON BRAND NAME(S): Granix What should I tell my health care provider before I take this medicine? They need to know if you have any of these conditions: -bone scan or tests planned -kidney disease -sickle cell anemia -an unusual or allergic reaction to tbo-filgrastim, filgrastim, pegfilgrastim, other medicines, foods, dyes, or preservatives -pregnant or trying to get pregnant -breast-feeding How should I use this medicine? This medicine is for injection under the skin. If you get this medicine at home, you will be taught how to prepare and give this medicine. Refer to the Instructions for Use that come with your medication packaging. Use exactly as directed. Take your medicine at regular intervals. Do not take your medicine more often than directed. It is important that you put your used needles and syringes in a special sharps container. Do not put them in a trash can. If you do not have a sharps container, call your pharmacist or healthcare provider to get one. Talk to your pediatrician regarding the use of this medicine in children. Special care may be needed. Overdosage: If you think you have taken too much of this medicine contact a poison control center or emergency room at once. NOTE: This medicine is only for you. Do not share this medicine with others. What if I miss a dose? It is important not to miss your dose. Call your doctor or health care professional if you miss a  dose. What may interact with this medicine? This medicine may interact with the following medications: -medicines that may cause a release of neutrophils, such as lithium This list may not describe all possible interactions. Give your health care provider a list of all the medicines, herbs, non-prescription drugs, or dietary supplements you use. Also tell them if you smoke, drink alcohol, or use illegal drugs. Some items may interact with your medicine. What should I watch for while using this medicine? You may need blood work done while you are taking this medicine. What side effects may I notice from receiving this medicine? Side effects that you should report to your doctor or health care professional as soon as possible: -allergic reactions like skin rash, itching or hives, swelling of the face, lips, or tongue -blood in the urine -dark urine -dizziness -fast heartbeat -feeling faint -shortness of breath or breathing problems -signs and symptoms of infection like fever or chills; cough; or sore throat -signs and symptoms of kidney injury like trouble passing urine or change in the amount of urine -stomach or side pain, or pain at the shoulder -sweating -swelling of the legs, ankles, or abdomen -tiredness Side effects that usually do not require medical attention (report to your doctor or health care professional if they continue or are bothersome): -bone pain -headache -muscle pain -vomiting This list may not describe all possible side effects. Call your doctor for medical advice about side effects. You may report side effects to FDA at 1-800-FDA-1088. Where should I keep my medicine? Keep out of the reach of children. Store in a refrigerator between   2 and 8 degrees C (36 and 46 degrees F). Keep in carton to protect from light. Throw away this medicine if it is left out of the refrigerator for more than 5 consecutive days. Throw away any unused medicine after the expiration  date. NOTE: This sheet is a summary. It may not cover all possible information. If you have questions about this medicine, talk to your doctor, pharmacist, or health care provider.  2018 [employer]vier/Gold Standard (2015-11-22 19:07:04)  

## 2017-10-29 NOTE — Telephone Encounter (Signed)
Scheduled appt per 1/14 sch message - Patient is aware of appt date and time.

## 2017-10-30 ENCOUNTER — Ambulatory Visit: Payer: Medicare HMO | Admitting: Radiation Oncology

## 2017-10-30 ENCOUNTER — Ambulatory Visit: Payer: Medicare HMO

## 2017-10-30 ENCOUNTER — Inpatient Hospital Stay: Payer: Medicare HMO

## 2017-10-30 ENCOUNTER — Ambulatory Visit (HOSPITAL_COMMUNITY): Payer: Medicare HMO

## 2017-10-30 ENCOUNTER — Ambulatory Visit (HOSPITAL_BASED_OUTPATIENT_CLINIC_OR_DEPARTMENT_OTHER): Admission: RE | Admit: 2017-10-30 | Payer: Medicare HMO | Source: Ambulatory Visit | Admitting: Radiation Oncology

## 2017-10-30 DIAGNOSIS — C530 Malignant neoplasm of endocervix: Secondary | ICD-10-CM

## 2017-10-30 DIAGNOSIS — C539 Malignant neoplasm of cervix uteri, unspecified: Secondary | ICD-10-CM | POA: Diagnosis not present

## 2017-10-30 HISTORY — DX: Presence of spectacles and contact lenses: Z97.3

## 2017-10-30 HISTORY — DX: Agranulocytosis secondary to cancer chemotherapy: T45.1X5A

## 2017-10-30 HISTORY — DX: Personal history of colonic polyps: Z86.010

## 2017-10-30 HISTORY — DX: Malignant neoplasm of cervix uteri, unspecified: C53.9

## 2017-10-30 HISTORY — DX: Nontoxic single thyroid nodule: E04.1

## 2017-10-30 HISTORY — DX: Adverse effect of antineoplastic and immunosuppressive drugs, initial encounter: D70.1

## 2017-10-30 HISTORY — DX: Other nonspecific abnormal finding of lung field: R91.8

## 2017-10-30 SURGERY — INSERTION, UTERINE TANDEM AND RING OR CYLINDER, FOR BRACHYTHERAPY
Anesthesia: General

## 2017-10-30 MED ORDER — TBO-FILGRASTIM 480 MCG/0.8ML ~~LOC~~ SOSY
480.0000 ug | PREFILLED_SYRINGE | Freq: Once | SUBCUTANEOUS | Status: AC
  Administered 2017-10-30: 480 ug via SUBCUTANEOUS

## 2017-10-30 MED ORDER — TBO-FILGRASTIM 480 MCG/0.8ML ~~LOC~~ SOSY
PREFILLED_SYRINGE | SUBCUTANEOUS | Status: AC
  Filled 2017-10-30: qty 0.8

## 2017-10-30 NOTE — Patient Instructions (Signed)
Tbo-Filgrastim injection What is this medicine? TBO-FILGRASTIM (T B O fil GRA stim) is a granulocyte colony-stimulating factor that stimulates the growth of neutrophils, a type of white blood cell important in the body's fight against infection. It is used to reduce the incidence of fever and infection in patients with certain types of cancer who are receiving chemotherapy that affects the bone marrow. This medicine may be used for other purposes; ask your health care provider or pharmacist if you have questions. COMMON BRAND NAME(S): Granix What should I tell my health care provider before I take this medicine? They need to know if you have any of these conditions: -bone scan or tests planned -kidney disease -sickle cell anemia -an unusual or allergic reaction to tbo-filgrastim, filgrastim, pegfilgrastim, other medicines, foods, dyes, or preservatives -pregnant or trying to get pregnant -breast-feeding How should I use this medicine? This medicine is for injection under the skin. If you get this medicine at home, you will be taught how to prepare and give this medicine. Refer to the Instructions for Use that come with your medication packaging. Use exactly as directed. Take your medicine at regular intervals. Do not take your medicine more often than directed. It is important that you put your used needles and syringes in a special sharps container. Do not put them in a trash can. If you do not have a sharps container, call your pharmacist or healthcare provider to get one. Talk to your pediatrician regarding the use of this medicine in children. Special care may be needed. Overdosage: If you think you have taken too much of this medicine contact a poison control center or emergency room at once. NOTE: This medicine is only for you. Do not share this medicine with others. What if I miss a dose? It is important not to miss your dose. Call your doctor or health care professional if you miss a  dose. What may interact with this medicine? This medicine may interact with the following medications: -medicines that may cause a release of neutrophils, such as lithium This list may not describe all possible interactions. Give your health care provider a list of all the medicines, herbs, non-prescription drugs, or dietary supplements you use. Also tell them if you smoke, drink alcohol, or use illegal drugs. Some items may interact with your medicine. What should I watch for while using this medicine? You may need blood work done while you are taking this medicine. What side effects may I notice from receiving this medicine? Side effects that you should report to your doctor or health care professional as soon as possible: -allergic reactions like skin rash, itching or hives, swelling of the face, lips, or tongue -blood in the urine -dark urine -dizziness -fast heartbeat -feeling faint -shortness of breath or breathing problems -signs and symptoms of infection like fever or chills; cough; or sore throat -signs and symptoms of kidney injury like trouble passing urine or change in the amount of urine -stomach or side pain, or pain at the shoulder -sweating -swelling of the legs, ankles, or abdomen -tiredness Side effects that usually do not require medical attention (report to your doctor or health care professional if they continue or are bothersome): -bone pain -headache -muscle pain -vomiting This list may not describe all possible side effects. Call your doctor for medical advice about side effects. You may report side effects to FDA at 1-800-FDA-1088. Where should I keep my medicine? Keep out of the reach of children. Store in a refrigerator between   2 and 8 degrees C (36 and 46 degrees F). Keep in carton to protect from light. Throw away this medicine if it is left out of the refrigerator for more than 5 consecutive days. Throw away any unused medicine after the expiration  date. NOTE: This sheet is a summary. It may not cover all possible information. If you have questions about this medicine, talk to your doctor, pharmacist, or health care provider.  2018 [employer]vier/Gold Standard (2015-11-22 19:07:04)  

## 2017-10-31 ENCOUNTER — Ambulatory Visit: Payer: Medicare HMO

## 2017-10-31 ENCOUNTER — Inpatient Hospital Stay: Payer: Medicare HMO

## 2017-10-31 DIAGNOSIS — C539 Malignant neoplasm of cervix uteri, unspecified: Secondary | ICD-10-CM | POA: Diagnosis not present

## 2017-10-31 DIAGNOSIS — C530 Malignant neoplasm of endocervix: Secondary | ICD-10-CM

## 2017-10-31 MED ORDER — TBO-FILGRASTIM 480 MCG/0.8ML ~~LOC~~ SOSY
PREFILLED_SYRINGE | SUBCUTANEOUS | Status: AC
  Filled 2017-10-31: qty 0.8

## 2017-10-31 MED ORDER — TBO-FILGRASTIM 480 MCG/0.8ML ~~LOC~~ SOSY
480.0000 ug | PREFILLED_SYRINGE | Freq: Once | SUBCUTANEOUS | Status: AC
  Administered 2017-10-31: 480 ug via SUBCUTANEOUS

## 2017-11-01 NOTE — H&P (View-Only) (Signed)
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 female who is  recently completed her external beam radiation therapy and radiosensitizing chemotherapy as part of management of her stage IIB adenocarcinoma the cervix. The patient was originally scheduled for her first OR procedure last week however the patient was neutropenic and this procedure was canceled. Patient is now taken the operating room for exam under anesthesia and placement of tandem ring in preparation for high-dose rate radiation therapy.Marland Kitchen   PAST MEDICAL HISTORY:  has a past medical history of Cervical adenocarcinoma [facility]) (primary oncologist-  dr gorsuch/  oncologist-  dr Sondra Come), History of colon polyps (2015), Leukopenia due to antineoplastic chemotherapy [facility]), Multiple lung nodules, Thyroid nodule, and Wears glasses.    PAST SURGICAL HISTORY: Past Surgical History:  Procedure Laterality Date  . COLONOSCOPY  2015  . IR FLUORO GUIDE PORT INSERTION RIGHT  09/10/2017  . IR US GUIDE VASC ACCESS RIGHT  09/10/2017  . TONSILLECTOMY  child    FAMILY HISTORY: family history includes Colon cancer (age of onset: 74) in her father; Dementia in her mother; Hypertension in her mother.  SOCIAL HISTORY:  reports that  has never smoked. she has never used smokeless tobacco. She reports that she does not drink alcohol or use drugs.  ALLERGIES: Tegaderm ag mesh [silver]  MEDICATIONS:  No current facility-administered medications for this encounter.    Current Outpatient Medications  Medication Sig Dispense Refill  . lidocaine-prilocaine (EMLA) cream Apply to affected area once 30 g 3  . ondansetron (ZOFRAN) 8 MG tablet Take 1 tablet  (8 mg total) by mouth 2 (two) times daily as needed. Start on the third day after chemotherapy. 30 tablet 1  . prochlorperazine (COMPAZINE) 10 MG tablet Take 1 tablet (10 mg total) by mouth every 6 (six) hours as needed (Nausea or vomiting). 30 tablet 1    REVIEW OF SYSTEMS:  A 15 point review of systems is documented in the electronic medical record. This was obtained by the nursing staff. However, I reviewed this with the patient to discuss relevant findings and make appropriate changes.  She has minimal fatigue. No vaginal bleeding or pelvic pain. No hematuria or rectal bleeding. No fever or chills  PHYSICAL EXAM:   09/25/17 1259  BP: (!) 144/82  Pulse: (!) 118  Resp: 20  Temp: 98.5 F (36.9 C)  SpO2: 100%   General: Alert and oriented, in no acute distress HEENT: Head is normocephalic. Extraocular movements are intact. Oropharynx is clear. Neck: Neck is supple, no palpable cervical or supraclavicular lymphadenopathy. Heart: Regular in rate and rhythm with no murmurs, rubs, or gallops. Chest: Clear to auscultation bilaterally, with no rhonchi, wheezes, or rales. Abdomen: Soft, nontender, nondistended, with no rigidity or guarding. Extremities: No cyanosis or edema. Lymphatics: see Neck Exam Skin: No concerning lesions. Musculoskeletal: symmetric strength and muscle tone throughout. Neurologic: Cranial nerves II through XII are grossly intact. No obvious focalities. Speech is fluent. Coordination is intact. Psychiatric: Judgment and insight are intact. Affect is appropriate. Pelvic exam deferred until OR procedure    ECOG = 1  LABORATORY DATA:  Lab Results  Component Value Date   WBC 0.9 (LL) 10/29/2017   HGB 10.7 (L) 10/22/2017  HCT 30.5 (L) 10/29/2017   MCV 88.3 10/29/2017   PLT 209 10/29/2017   NEUTROABS 0.4 (LL) 10/29/2017   Lab Results  Component Value Date   NA 140 10/22/2017   K 4.1 10/22/2017   CL 107 10/22/2017   CO2 25 10/22/2017   GLUCOSE 107 10/22/2017     CREATININE 0.97 10/22/2017   CALCIUM 9.4 10/22/2017      RADIOGRAPHY: No results found.    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 Patient is now ready to proceed with brachytherapy as part of her definitive treatment. Patient we taken to the operating room. Once patient is asleep she will undergo exam under anesthesia followed by dilation of the cervix and placement of tandem ring equipment in preparation for high-dose rate treatments using iridium 192 as the high-dose-rate source. Patient is scheduled to receive 5 procedures.   ------------------------------------------------  Blair Promise, PhD, MD

## 2017-11-01 NOTE — H&P (View-Only) (Signed)
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 female who is  recently completed her external beam radiation therapy and radiosensitizing chemotherapy as part of management of her stage IIB adenocarcinoma the cervix. The patient was originally scheduled for her first OR procedure last week however the patient was neutropenic and this procedure was canceled. Patient is now taken the operating room for exam under anesthesia and placement of tandem ring in preparation for high-dose rate radiation therapy.Marland Kitchen   PAST MEDICAL HISTORY:  has a past medical history of Cervical adenocarcinoma [facility]) (primary oncologist-  dr gorsuch/  oncologist-  dr Sondra Come), History of colon polyps (2015), Leukopenia due to antineoplastic chemotherapy [facility]), Multiple lung nodules, Thyroid nodule, and Wears glasses.    PAST SURGICAL HISTORY: Past Surgical History:  Procedure Laterality Date  . COLONOSCOPY  2015  . IR FLUORO GUIDE PORT INSERTION RIGHT  09/10/2017  . IR US GUIDE VASC ACCESS RIGHT  09/10/2017  . TONSILLECTOMY  child    FAMILY HISTORY: family history includes Colon cancer (age of onset: 87) in her father; Dementia in her mother; Hypertension in her mother.  SOCIAL HISTORY:  reports that  has never smoked. she has never used smokeless tobacco. She reports that she does not drink alcohol or use drugs.  ALLERGIES: Tegaderm ag mesh [silver]  MEDICATIONS:  No current facility-administered medications for this encounter.    Current Outpatient Medications  Medication Sig Dispense Refill  . lidocaine-prilocaine (EMLA) cream Apply to affected area once 30 g 3  . ondansetron (ZOFRAN) 8 MG tablet Take 1 tablet  (8 mg total) by mouth 2 (two) times daily as needed. Start on the third day after chemotherapy. 30 tablet 1  . prochlorperazine (COMPAZINE) 10 MG tablet Take 1 tablet (10 mg total) by mouth every 6 (six) hours as needed (Nausea or vomiting). 30 tablet 1    REVIEW OF SYSTEMS:  A 15 point review of systems is documented in the electronic medical record. This was obtained by the nursing staff. However, I reviewed this with the patient to discuss relevant findings and make appropriate changes.  She has minimal fatigue. No vaginal bleeding or pelvic pain. No hematuria or rectal bleeding. No fever or chills  PHYSICAL EXAM:   09/25/17 1259  BP: (!) 144/82  Pulse: (!) 118  Resp: 20  Temp: 98.5 F (36.9 C)  SpO2: 100%   General: Alert and oriented, in no acute distress HEENT: Head is normocephalic. Extraocular movements are intact. Oropharynx is clear. Neck: Neck is supple, no palpable cervical or supraclavicular lymphadenopathy. Heart: Regular in rate and rhythm with no murmurs, rubs, or gallops. Chest: Clear to auscultation bilaterally, with no rhonchi, wheezes, or rales. Abdomen: Soft, nontender, nondistended, with no rigidity or guarding. Extremities: No cyanosis or edema. Lymphatics: see Neck Exam Skin: No concerning lesions. Musculoskeletal: symmetric strength and muscle tone throughout. Neurologic: Cranial nerves II through XII are grossly intact. No obvious focalities. Speech is fluent. Coordination is intact. Psychiatric: Judgment and insight are intact. Affect is appropriate. Pelvic exam deferred until OR procedure    ECOG = 1  LABORATORY DATA:  Lab Results  Component Value Date   WBC 0.9 (LL) 10/29/2017   HGB 10.7 (L) 10/22/2017  HCT 30.5 (L) 10/29/2017   MCV 88.3 10/29/2017   PLT 209 10/29/2017   NEUTROABS 0.4 (LL) 10/29/2017   Lab Results  Component Value Date   NA 140 10/22/2017   K 4.1 10/22/2017   CL 107 10/22/2017   CO2 25 10/22/2017   GLUCOSE 107 10/22/2017     CREATININE 0.97 10/22/2017   CALCIUM 9.4 10/22/2017      RADIOGRAPHY: No results found.    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 Patient is now ready to proceed with brachytherapy as part of her definitive treatment. Patient we taken to the operating room. Once patient is asleep she will undergo exam under anesthesia followed by dilation of the cervix and placement of tandem ring equipment in preparation for high-dose rate treatments using iridium 192 as the high-dose-rate source. Patient is scheduled to receive 5 procedures.   ------------------------------------------------  Blair Promise, PhD, MD

## 2017-11-01 NOTE — H&P (Signed)
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 female who is  recently completed her external beam radiation therapy and radiosensitizing chemotherapy as part of management of her stage IIB adenocarcinoma the cervix. The patient was originally scheduled for her first OR procedure last week however the patient was neutropenic and this procedure was canceled. Patient is now taken the operating room for exam under anesthesia and placement of tandem ring in preparation for high-dose rate radiation therapy.Marland Kitchen   PAST MEDICAL HISTORY:  has a past medical history of Cervical adenocarcinoma [facility]) (primary oncologist-  dr gorsuch/  oncologist-  dr Sondra Come), History of colon polyps (2015), Leukopenia due to antineoplastic chemotherapy [facility]), Multiple lung nodules, Thyroid nodule, and Wears glasses.    PAST SURGICAL HISTORY: Past Surgical History:  Procedure Laterality Date  . COLONOSCOPY  2015  . IR FLUORO GUIDE PORT INSERTION RIGHT  09/10/2017  . IR US GUIDE VASC ACCESS RIGHT  09/10/2017  . TONSILLECTOMY  child    FAMILY HISTORY: family history includes Colon cancer (age of onset: 10) in her father; Dementia in her mother; Hypertension in her mother.  SOCIAL HISTORY:  reports that  has never smoked. she has never used smokeless tobacco. She reports that she does not drink alcohol or use drugs.  ALLERGIES: Tegaderm ag mesh [silver]  MEDICATIONS:  No current facility-administered medications for this encounter.    Current Outpatient Medications  Medication Sig Dispense Refill  . lidocaine-prilocaine (EMLA) cream Apply to affected area once 30 g 3  . ondansetron (ZOFRAN) 8 MG tablet Take 1 tablet  (8 mg total) by mouth 2 (two) times daily as needed. Start on the third day after chemotherapy. 30 tablet 1  . prochlorperazine (COMPAZINE) 10 MG tablet Take 1 tablet (10 mg total) by mouth every 6 (six) hours as needed (Nausea or vomiting). 30 tablet 1    REVIEW OF SYSTEMS:  A 15 point review of systems is documented in the electronic medical record. This was obtained by the nursing staff. However, I reviewed this with the patient to discuss relevant findings and make appropriate changes.  She has minimal fatigue. No vaginal bleeding or pelvic pain. No hematuria or rectal bleeding. No fever or chills  PHYSICAL EXAM:   09/25/17 1259  BP: (!) 144/82  Pulse: (!) 118  Resp: 20  Temp: 98.5 F (36.9 C)  SpO2: 100%   General: Alert and oriented, in no acute distress HEENT: Head is normocephalic. Extraocular movements are intact. Oropharynx is clear. Neck: Neck is supple, no palpable cervical or supraclavicular lymphadenopathy. Heart: Regular in rate and rhythm with no murmurs, rubs, or gallops. Chest: Clear to auscultation bilaterally, with no rhonchi, wheezes, or rales. Abdomen: Soft, nontender, nondistended, with no rigidity or guarding. Extremities: No cyanosis or edema. Lymphatics: see Neck Exam Skin: No concerning lesions. Musculoskeletal: symmetric strength and muscle tone throughout. Neurologic: Cranial nerves II through XII are grossly intact. No obvious focalities. Speech is fluent. Coordination is intact. Psychiatric: Judgment and insight are intact. Affect is appropriate. Pelvic exam deferred until OR procedure    ECOG = 1  LABORATORY DATA:  Lab Results  Component Value Date   WBC 0.9 (LL) 10/29/2017   HGB 10.7 (L) 10/22/2017  HCT 30.5 (L) 10/29/2017   MCV 88.3 10/29/2017   PLT 209 10/29/2017   NEUTROABS 0.4 (LL) 10/29/2017   Lab Results  Component Value Date   NA 140 10/22/2017   K 4.1 10/22/2017   CL 107 10/22/2017   CO2 25 10/22/2017   GLUCOSE 107 10/22/2017     CREATININE 0.97 10/22/2017   CALCIUM 9.4 10/22/2017      RADIOGRAPHY: No results found.    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 Patient is now ready to proceed with brachytherapy as part of her definitive treatment. Patient we taken to the operating room. Once patient is asleep she will undergo exam under anesthesia followed by dilation of the cervix and placement of tandem ring equipment in preparation for high-dose rate treatments using iridium 192 as the high-dose-rate source. Patient is scheduled to receive 5 procedures.   ------------------------------------------------  Blair Promise, PhD, MD

## 2017-11-01 NOTE — H&P (View-Only) (Signed)
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 female who is  recently completed her external beam radiation therapy and radiosensitizing chemotherapy as part of management of her stage IIB adenocarcinoma the cervix. The patient was originally scheduled for her first OR procedure last week however the patient was neutropenic and this procedure was canceled. Patient is now taken the operating room for exam under anesthesia and placement of tandem ring in preparation for high-dose rate radiation therapy.Marland Kitchen   PAST MEDICAL HISTORY:  has a past medical history of Cervical adenocarcinoma [facility]) (primary oncologist-  dr gorsuch/  oncologist-  dr Sondra Come), History of colon polyps (2015), Leukopenia due to antineoplastic chemotherapy [facility]), Multiple lung nodules, Thyroid nodule, and Wears glasses.    PAST SURGICAL HISTORY: Past Surgical History:  Procedure Laterality Date  . COLONOSCOPY  2015  . IR FLUORO GUIDE PORT INSERTION RIGHT  09/10/2017  . IR US GUIDE VASC ACCESS RIGHT  09/10/2017  . TONSILLECTOMY  child    FAMILY HISTORY: family history includes Colon cancer (age of onset: 75) in her father; Dementia in her mother; Hypertension in her mother.  SOCIAL HISTORY:  reports that  has never smoked. she has never used smokeless tobacco. She reports that she does not drink alcohol or use drugs.  ALLERGIES: Tegaderm ag mesh [silver]  MEDICATIONS:  No current facility-administered medications for this encounter.    Current Outpatient Medications  Medication Sig Dispense Refill  . lidocaine-prilocaine (EMLA) cream Apply to affected area once 30 g 3  . ondansetron (ZOFRAN) 8 MG tablet Take 1 tablet  (8 mg total) by mouth 2 (two) times daily as needed. Start on the third day after chemotherapy. 30 tablet 1  . prochlorperazine (COMPAZINE) 10 MG tablet Take 1 tablet (10 mg total) by mouth every 6 (six) hours as needed (Nausea or vomiting). 30 tablet 1    REVIEW OF SYSTEMS:  A 15 point review of systems is documented in the electronic medical record. This was obtained by the nursing staff. However, I reviewed this with the patient to discuss relevant findings and make appropriate changes.  She has minimal fatigue. No vaginal bleeding or pelvic pain. No hematuria or rectal bleeding. No fever or chills  PHYSICAL EXAM:   09/25/17 1259  BP: (!) 144/82  Pulse: (!) 118  Resp: 20  Temp: 98.5 F (36.9 C)  SpO2: 100%   General: Alert and oriented, in no acute distress HEENT: Head is normocephalic. Extraocular movements are intact. Oropharynx is clear. Neck: Neck is supple, no palpable cervical or supraclavicular lymphadenopathy. Heart: Regular in rate and rhythm with no murmurs, rubs, or gallops. Chest: Clear to auscultation bilaterally, with no rhonchi, wheezes, or rales. Abdomen: Soft, nontender, nondistended, with no rigidity or guarding. Extremities: No cyanosis or edema. Lymphatics: see Neck Exam Skin: No concerning lesions. Musculoskeletal: symmetric strength and muscle tone throughout. Neurologic: Cranial nerves II through XII are grossly intact. No obvious focalities. Speech is fluent. Coordination is intact. Psychiatric: Judgment and insight are intact. Affect is appropriate. Pelvic exam deferred until OR procedure    ECOG = 1  LABORATORY DATA:  Lab Results  Component Value Date   WBC 0.9 (LL) 10/29/2017   HGB 10.7 (L) 10/22/2017  HCT 30.5 (L) 10/29/2017   MCV 88.3 10/29/2017   PLT 209 10/29/2017   NEUTROABS 0.4 (LL) 10/29/2017   Lab Results  Component Value Date   NA 140 10/22/2017   K 4.1 10/22/2017   CL 107 10/22/2017   CO2 25 10/22/2017   GLUCOSE 107 10/22/2017     CREATININE 0.97 10/22/2017   CALCIUM 9.4 10/22/2017      RADIOGRAPHY: No results found.    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 Patient is now ready to proceed with brachytherapy as part of her definitive treatment. Patient we taken to the operating room. Once patient is asleep she will undergo exam under anesthesia followed by dilation of the cervix and placement of tandem ring equipment in preparation for high-dose rate treatments using iridium 192 as the high-dose-rate source. Patient is scheduled to receive 5 procedures.   ------------------------------------------------  Blair Promise, PhD, MD

## 2017-11-01 NOTE — H&P (View-Only) (Signed)
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 female who is  recently completed her external beam radiation therapy and radiosensitizing chemotherapy as part of management of her stage IIB adenocarcinoma the cervix. The patient was originally scheduled for her first OR procedure last week however the patient was neutropenic and this procedure was canceled. Patient is now taken the operating room for exam under anesthesia and placement of tandem ring in preparation for high-dose rate radiation therapy.Marland Kitchen   PAST MEDICAL HISTORY:  has a past medical history of Cervical adenocarcinoma [facility]) (primary oncologist-  dr gorsuch/  oncologist-  dr Sondra Come), History of colon polyps (2015), Leukopenia due to antineoplastic chemotherapy [facility]), Multiple lung nodules, Thyroid nodule, and Wears glasses.    PAST SURGICAL HISTORY: Past Surgical History:  Procedure Laterality Date  . COLONOSCOPY  2015  . IR FLUORO GUIDE PORT INSERTION RIGHT  09/10/2017  . IR US GUIDE VASC ACCESS RIGHT  09/10/2017  . TONSILLECTOMY  child    FAMILY HISTORY: family history includes Colon cancer (age of onset: 68) in her father; Dementia in her mother; Hypertension in her mother.  SOCIAL HISTORY:  reports that  has never smoked. she has never used smokeless tobacco. She reports that she does not drink alcohol or use drugs.  ALLERGIES: Tegaderm ag mesh [silver]  MEDICATIONS:  No current facility-administered medications for this encounter.    Current Outpatient Medications  Medication Sig Dispense Refill  . lidocaine-prilocaine (EMLA) cream Apply to affected area once 30 g 3  . ondansetron (ZOFRAN) 8 MG tablet Take 1 tablet  (8 mg total) by mouth 2 (two) times daily as needed. Start on the third day after chemotherapy. 30 tablet 1  . prochlorperazine (COMPAZINE) 10 MG tablet Take 1 tablet (10 mg total) by mouth every 6 (six) hours as needed (Nausea or vomiting). 30 tablet 1    REVIEW OF SYSTEMS:  A 15 point review of systems is documented in the electronic medical record. This was obtained by the nursing staff. However, I reviewed this with the patient to discuss relevant findings and make appropriate changes.  She has minimal fatigue. No vaginal bleeding or pelvic pain. No hematuria or rectal bleeding. No fever or chills  PHYSICAL EXAM:   09/25/17 1259  BP: (!) 144/82  Pulse: (!) 118  Resp: 20  Temp: 98.5 F (36.9 C)  SpO2: 100%   General: Alert and oriented, in no acute distress HEENT: Head is normocephalic. Extraocular movements are intact. Oropharynx is clear. Neck: Neck is supple, no palpable cervical or supraclavicular lymphadenopathy. Heart: Regular in rate and rhythm with no murmurs, rubs, or gallops. Chest: Clear to auscultation bilaterally, with no rhonchi, wheezes, or rales. Abdomen: Soft, nontender, nondistended, with no rigidity or guarding. Extremities: No cyanosis or edema. Lymphatics: see Neck Exam Skin: No concerning lesions. Musculoskeletal: symmetric strength and muscle tone throughout. Neurologic: Cranial nerves II through XII are grossly intact. No obvious focalities. Speech is fluent. Coordination is intact. Psychiatric: Judgment and insight are intact. Affect is appropriate. Pelvic exam deferred until OR procedure    ECOG = 1  LABORATORY DATA:  Lab Results  Component Value Date   WBC 0.9 (LL) 10/29/2017   HGB 10.7 (L) 10/22/2017  HCT 30.5 (L) 10/29/2017   MCV 88.3 10/29/2017   PLT 209 10/29/2017   NEUTROABS 0.4 (LL) 10/29/2017   Lab Results  Component Value Date   NA 140 10/22/2017   K 4.1 10/22/2017   CL 107 10/22/2017   CO2 25 10/22/2017   GLUCOSE 107 10/22/2017     CREATININE 0.97 10/22/2017   CALCIUM 9.4 10/22/2017      RADIOGRAPHY: No results found.    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 Patient is now ready to proceed with brachytherapy as part of her definitive treatment. Patient we taken to the operating room. Once patient is asleep she will undergo exam under anesthesia followed by dilation of the cervix and placement of tandem ring equipment in preparation for high-dose rate treatments using iridium 192 as the high-dose-rate source. Patient is scheduled to receive 5 procedures.   ------------------------------------------------  Blair Promise, PhD, MD

## 2017-11-02 ENCOUNTER — Other Ambulatory Visit: Payer: Self-pay

## 2017-11-02 ENCOUNTER — Encounter (HOSPITAL_BASED_OUTPATIENT_CLINIC_OR_DEPARTMENT_OTHER): Payer: Self-pay | Admitting: *Deleted

## 2017-11-02 ENCOUNTER — Telehealth: Payer: Self-pay | Admitting: *Deleted

## 2017-11-02 ENCOUNTER — Inpatient Hospital Stay: Payer: Medicare HMO

## 2017-11-02 DIAGNOSIS — C539 Malignant neoplasm of cervix uteri, unspecified: Secondary | ICD-10-CM

## 2017-11-02 DIAGNOSIS — R918 Other nonspecific abnormal finding of lung field: Secondary | ICD-10-CM

## 2017-11-02 LAB — COMPREHENSIVE METABOLIC PANEL
ALBUMIN: 3.7 g/dL (ref 3.5–5.0)
ALK PHOS: 104 U/L (ref 40–150)
ALT: 11 U/L (ref 0–55)
ANION GAP: 9 (ref 3–11)
AST: 22 U/L (ref 5–34)
BILIRUBIN TOTAL: 0.2 mg/dL (ref 0.2–1.2)
BUN: 11 mg/dL (ref 7–26)
CALCIUM: 9.4 mg/dL (ref 8.4–10.4)
CO2: 27 mmol/L (ref 22–29)
Chloride: 105 mmol/L (ref 98–109)
Creatinine, Ser: 1.09 mg/dL (ref 0.60–1.10)
GFR calc Af Amer: 59 mL/min — ABNORMAL LOW (ref >60)
GFR calc non Af Amer: 51 mL/min — ABNORMAL LOW (ref >60)
GLUCOSE: 103 mg/dL (ref 70–140)
Potassium: 4.1 mmol/L (ref 3.3–4.7)
Sodium: 141 mmol/L (ref 136–145)
TOTAL PROTEIN: 7.1 g/dL (ref 6.4–8.3)

## 2017-11-02 LAB — CBC WITH DIFFERENTIAL/PLATELET
BASOS PCT: 0 %
Basophils Absolute: 0 10*3/uL (ref 0.0–0.1)
EOS ABS: 0 10*3/uL (ref 0.0–0.5)
EOS PCT: 0 %
HCT: 32.2 % — ABNORMAL LOW (ref 34.8–46.6)
Hemoglobin: 10.7 g/dL — ABNORMAL LOW (ref 11.6–15.9)
Lymphocytes Relative: 6 %
Lymphs Abs: 0.9 10*3/uL (ref 0.9–3.3)
MCH: 29.7 pg (ref 25.1–34.0)
MCHC: 33.3 g/dL (ref 31.5–36.0)
MCV: 89.1 fL (ref 79.5–101.0)
MONO ABS: 2.1 10*3/uL — AB (ref 0.1–0.9)
MONOS PCT: 14 %
Neutro Abs: 12.2 10*3/uL — ABNORMAL HIGH (ref 1.5–6.5)
Neutrophils Relative %: 80 %
Platelets: 234 10*3/uL (ref 145–400)
RBC: 3.61 MIL/uL — ABNORMAL LOW (ref 3.70–5.45)
RDW: 17.4 % — AB (ref 11.2–16.1)
Smear Review: 1
WBC: 15.3 10*3/uL — ABNORMAL HIGH (ref 3.9–10.3)

## 2017-11-02 LAB — MAGNESIUM: Magnesium: 1.8 mg/dL (ref 1.5–2.5)

## 2017-11-02 NOTE — Progress Notes (Addendum)
SPOKE W/ PT VIA PHONE FOR PRE-OP INTERVIEW.  NPO AFTER MN.  ARRIVE AT 0530.  CURRENT LAB RESULTS IN CHART AND Epic.  MAY TAKE NAUSEA RX IF NEEDED AM DOS W/ SIPS OF WATER.  ~~~~~PLEASE BE CAREFUL RELEASING MD ORDERS HE HAS 11-06-2017 AND 11-08-2017 VISIT'S IN Epic !!!~~~~~~~

## 2017-11-02 NOTE — Telephone Encounter (Signed)
-----   Message from Heath Lark, MD sent at 11/02/2017  8:27 AM EST ----- Regarding: LAbs pls let her know CBC l;ooks good I recommend she keeps her labs on Monday still because it might drop over the weekend ----- Message ----- From: Interface, Lab In [place] Sent: 11/02/2017   8:18 AM To: Heath Lark, MD

## 2017-11-02 NOTE — Telephone Encounter (Signed)
As noted below by Dr. Alvy Bimler, I informed patient of her lab results. Instructed her to keep lab appointment for Monday in case labs drop over the weekend. She verbalized understanding.

## 2017-11-05 ENCOUNTER — Telehealth: Payer: Self-pay | Admitting: *Deleted

## 2017-11-05 ENCOUNTER — Encounter: Payer: Self-pay | Admitting: Radiation Oncology

## 2017-11-05 ENCOUNTER — Inpatient Hospital Stay: Payer: Medicare HMO

## 2017-11-05 DIAGNOSIS — R918 Other nonspecific abnormal finding of lung field: Secondary | ICD-10-CM

## 2017-11-05 DIAGNOSIS — C539 Malignant neoplasm of cervix uteri, unspecified: Secondary | ICD-10-CM

## 2017-11-05 LAB — COMPREHENSIVE METABOLIC PANEL
ALT: 12 U/L (ref 0–55)
AST: 17 U/L (ref 5–34)
Albumin: 3.8 g/dL (ref 3.5–5.0)
Alkaline Phosphatase: 85 U/L (ref 40–150)
Anion gap: 10 (ref 3–11)
BILIRUBIN TOTAL: 0.2 mg/dL (ref 0.2–1.2)
BUN: 9 mg/dL (ref 7–26)
CHLORIDE: 106 mmol/L (ref 98–109)
CO2: 27 mmol/L (ref 22–29)
CREATININE: 0.99 mg/dL (ref 0.60–1.10)
Calcium: 9.6 mg/dL (ref 8.4–10.4)
GFR, EST NON AFRICAN AMERICAN: 58 mL/min — AB (ref >60)
Glucose, Bld: 93 mg/dL (ref 70–140)
POTASSIUM: 4.3 mmol/L (ref 3.3–4.7)
Sodium: 143 mmol/L (ref 136–145)
TOTAL PROTEIN: 7.1 g/dL (ref 6.4–8.3)

## 2017-11-05 LAB — CBC WITH DIFFERENTIAL/PLATELET
Basophils Absolute: 0.1 10*3/uL (ref 0.0–0.1)
Basophils Relative: 1 %
EOS PCT: 0 %
Eosinophils Absolute: 0 10*3/uL (ref 0.0–0.5)
HEMATOCRIT: 33.6 % — AB (ref 34.8–46.6)
Hemoglobin: 10.8 g/dL — ABNORMAL LOW (ref 11.6–15.9)
LYMPHS ABS: 1.1 10*3/uL (ref 0.9–3.3)
LYMPHS PCT: 14 %
MCH: 29.3 pg (ref 25.1–34.0)
MCHC: 32.1 g/dL (ref 31.5–36.0)
MCV: 91.1 fL (ref 79.5–101.0)
MONO ABS: 0.9 10*3/uL (ref 0.1–0.9)
Monocytes Relative: 12 %
NEUTROS ABS: 5.6 10*3/uL (ref 1.5–6.5)
Neutrophils Relative %: 73 %
PLATELETS: 213 10*3/uL (ref 145–400)
RBC: 3.69 MIL/uL — ABNORMAL LOW (ref 3.70–5.45)
RDW: 16.7 % — ABNORMAL HIGH (ref 11.2–16.1)
WBC: 7.7 10*3/uL (ref 3.9–10.3)

## 2017-11-05 LAB — MAGNESIUM: MAGNESIUM: 2 mg/dL (ref 1.5–2.5)

## 2017-11-05 NOTE — Telephone Encounter (Signed)
-----   Message from Heath Lark, MD sent at 11/05/2017  9:01 AM EST ----- Regarding: labs Pls let her know labs are OK I plan to cancel her labs/flush dated on 1/29 and move it to this Friday instead in case she needs inj again. So, she will come in for labs/flush and inj this FRiday. Ask if that is ok with her and if so please send scheduling msg ----- Message ----- From: Interface, Lab In [place] Sent: 11/05/2017   8:19 AM To: Heath Lark, MD

## 2017-11-05 NOTE — Telephone Encounter (Signed)
Ms Travieso is OK cancelling 1/29 appts and coming this Friday for lab/flush and injection .  Message to scheduler.

## 2017-11-05 NOTE — Anesthesia Preprocedure Evaluation (Signed)
Anesthesia Evaluation  Patient identified by MRN, date of birth, ID band Patient awake    Reviewed: Allergy & Precautions, NPO status , Patient's Chart, lab work & pertinent test results  Airway Mallampati: II  TM Distance: >3 FB Neck ROM: Full    Dental no notable dental hx.    Pulmonary neg pulmonary ROS,    Pulmonary exam normal breath sounds clear to auscultation       Cardiovascular negative cardio ROS Normal cardiovascular exam Rhythm:Regular Rate:Normal     Neuro/Psych negative neurological ROS  negative psych ROS   GI/Hepatic negative GI ROS, Neg liver ROS,   Endo/Other  negative endocrine ROS  Renal/GU negative Renal ROS  negative genitourinary   Musculoskeletal negative musculoskeletal ROS (+)   Abdominal   Peds negative pediatric ROS (+)  Hematology negative hematology ROS (+)   Anesthesia Other Findings   Reproductive/Obstetrics negative OB ROS                             Anesthesia Physical Anesthesia Plan  ASA: II  Anesthesia Plan: General   Post-op Pain Management:    Induction: Intravenous  PONV Risk Score and Plan: 4 or greater and Ondansetron, Dexamethasone and Treatment may vary due to age or medical condition  Airway Management Planned: LMA  Additional Equipment:   Intra-op Plan:   Post-operative Plan: Extubation in OR  Informed Consent: I have reviewed the patients History and Physical, chart, labs and discussed the procedure including the risks, benefits and alternatives for the proposed anesthesia with the patient or authorized representative who has indicated his/her understanding and acceptance.   Dental advisory given  Plan Discussed with: CRNA  Anesthesia Plan Comments:         Anesthesia Quick Evaluation  

## 2017-11-06 ENCOUNTER — Ambulatory Visit (HOSPITAL_COMMUNITY)
Admission: RE | Admit: 2017-11-06 | Discharge: 2017-11-06 | Disposition: A | Discharge status: Home / Self Care | Payer: Medicare HMO | Source: Ambulatory Visit | Attending: Radiation Oncology | Admitting: Radiation Oncology

## 2017-11-06 ENCOUNTER — Encounter (HOSPITAL_BASED_OUTPATIENT_CLINIC_OR_DEPARTMENT_OTHER)
Admission: RE | Disposition: A | Discharge status: Home / Self Care | Payer: Self-pay | Source: Ambulatory Visit | Attending: Radiation Oncology

## 2017-11-06 ENCOUNTER — Ambulatory Visit (HOSPITAL_BASED_OUTPATIENT_CLINIC_OR_DEPARTMENT_OTHER): Payer: Medicare HMO | Admitting: Anesthesiology

## 2017-11-06 ENCOUNTER — Encounter (HOSPITAL_BASED_OUTPATIENT_CLINIC_OR_DEPARTMENT_OTHER): Payer: Self-pay

## 2017-11-06 ENCOUNTER — Ambulatory Visit (HOSPITAL_BASED_OUTPATIENT_CLINIC_OR_DEPARTMENT_OTHER)
Admission: RE | Admit: 2017-11-06 | Discharge: 2017-11-06 | Disposition: A | Discharge status: Home / Self Care | Payer: Medicare HMO | Source: Ambulatory Visit | Attending: Radiation Oncology | Admitting: Radiation Oncology

## 2017-11-06 ENCOUNTER — Ambulatory Visit
Admission: RE | Admit: 2017-11-06 | Discharge: 2017-11-06 | Disposition: A | Discharge status: Home / Self Care | Payer: Medicare HMO | Source: Ambulatory Visit | Attending: Radiation Oncology | Admitting: Radiation Oncology

## 2017-11-06 VITALS — BP 127/65 | HR 95

## 2017-11-06 DIAGNOSIS — Z9221 Personal history of antineoplastic chemotherapy: Secondary | ICD-10-CM | POA: Insufficient documentation

## 2017-11-06 DIAGNOSIS — C530 Malignant neoplasm of endocervix: Secondary | ICD-10-CM | POA: Insufficient documentation

## 2017-11-06 DIAGNOSIS — C539 Malignant neoplasm of cervix uteri, unspecified: Secondary | ICD-10-CM

## 2017-11-06 DIAGNOSIS — Z923 Personal history of irradiation: Secondary | ICD-10-CM | POA: Diagnosis not present

## 2017-11-06 DIAGNOSIS — Z86010 Personal history of colonic polyps: Secondary | ICD-10-CM | POA: Diagnosis not present

## 2017-11-06 DIAGNOSIS — R918 Other nonspecific abnormal finding of lung field: Secondary | ICD-10-CM | POA: Insufficient documentation

## 2017-11-06 HISTORY — DX: Nausea: R11.0

## 2017-11-06 HISTORY — PX: TANDEM RING INSERTION: SHX6199

## 2017-11-06 HISTORY — DX: Adverse effect of antineoplastic and immunosuppressive drugs, initial encounter: D70.1

## 2017-11-06 HISTORY — DX: Agranulocytosis secondary to cancer chemotherapy: T45.1X5A

## 2017-11-06 SURGERY — INSERTION, UTERINE TANDEM AND RING OR CYLINDER, FOR BRACHYTHERAPY
Anesthesia: General

## 2017-11-06 MED ORDER — FENTANYL CITRATE (PF) 100 MCG/2ML IJ SOLN
INTRAMUSCULAR | Status: DC | PRN
  Administered 2017-11-06 (×3): 50 ug via INTRAVENOUS

## 2017-11-06 MED ORDER — KETOROLAC TROMETHAMINE 30 MG/ML IJ SOLN
INTRAMUSCULAR | Status: AC
  Filled 2017-11-06: qty 1

## 2017-11-06 MED ORDER — OXYCODONE-ACETAMINOPHEN 5-325 MG PO TABS: 1.0000 | ORAL_TABLET | ORAL | 0 refills | Status: DC | PRN

## 2017-11-06 MED ORDER — ONDANSETRON HCL 4 MG/2ML IJ SOLN
INTRAMUSCULAR | Status: AC
  Filled 2017-11-06: qty 2

## 2017-11-06 MED ORDER — KETOROLAC TROMETHAMINE 30 MG/ML IJ SOLN
INTRAMUSCULAR | Status: DC | PRN
  Administered 2017-11-06: 30 mg via INTRAVENOUS

## 2017-11-06 MED ORDER — SODIUM CHLORIDE 0.9 % IV SOLN: Freq: Once | INTRAVENOUS | Status: DC

## 2017-11-06 MED ORDER — FENTANYL CITRATE (PF) 100 MCG/2ML IJ SOLN
INTRAMUSCULAR | Status: AC
  Filled 2017-11-06: qty 2

## 2017-11-06 MED ORDER — HYDROMORPHONE HCL 1 MG/ML IJ SOLN
0.5000 mg | Freq: Once | INTRAMUSCULAR | Status: AC
  Administered 2017-11-06: 0.5 mg via INTRAVENOUS
  Filled 2017-11-06: qty 1

## 2017-11-06 MED ORDER — LORAZEPAM 0.5 MG PO TABS
ORAL_TABLET | ORAL | Status: AC
  Filled 2017-11-06: qty 1

## 2017-11-06 MED ORDER — HYDROMORPHONE HCL 1 MG/ML IJ SOLN
INTRAMUSCULAR | Status: AC
  Filled 2017-11-06: qty 1

## 2017-11-06 MED ORDER — LORAZEPAM 0.5 MG PO TABS
0.5000 mg | ORAL_TABLET | Freq: Once | ORAL | Status: AC
  Administered 2017-11-06: 0.5 mg via SUBLINGUAL
  Filled 2017-11-06: qty 1

## 2017-11-06 MED ORDER — SODIUM CHLORIDE 0.9 % IR SOLN
Status: DC | PRN
  Administered 2017-11-06: 1000 mL via INTRAVESICAL

## 2017-11-06 MED ORDER — LIDOCAINE 2% (20 MG/ML) 5 ML SYRINGE
INTRAMUSCULAR | Status: DC | PRN
  Administered 2017-11-06: 100 mg via INTRAVENOUS

## 2017-11-06 MED ORDER — PROPOFOL 10 MG/ML IV BOLUS
INTRAVENOUS | Status: DC | PRN
  Administered 2017-11-06: 30 mg via INTRAVENOUS
  Administered 2017-11-06: 170 mg via INTRAVENOUS
  Administered 2017-11-06: 80 mg via INTRAVENOUS

## 2017-11-06 MED ORDER — DEXAMETHASONE SODIUM PHOSPHATE 10 MG/ML IJ SOLN
INTRAMUSCULAR | Status: AC
  Filled 2017-11-06: qty 1

## 2017-11-06 MED ORDER — ONDANSETRON HCL 4 MG/2ML IJ SOLN
4.0000 mg | Freq: Once | INTRAMUSCULAR | Status: AC
  Administered 2017-11-06: 4 mg via INTRAVENOUS
  Filled 2017-11-06: qty 2

## 2017-11-06 MED ORDER — DEXAMETHASONE SODIUM PHOSPHATE 10 MG/ML IJ SOLN
INTRAMUSCULAR | Status: DC | PRN
  Administered 2017-11-06: 10 mg via INTRAVENOUS

## 2017-11-06 MED ORDER — MIDAZOLAM HCL 5 MG/5ML IJ SOLN
INTRAMUSCULAR | Status: DC | PRN
  Administered 2017-11-06: 2 mg via INTRAVENOUS

## 2017-11-06 MED ORDER — LACTATED RINGERS IV SOLN
INTRAVENOUS | Status: DC
  Administered 2017-11-06: 06:00:00 via INTRAVENOUS
  Filled 2017-11-06 (×2): qty 1000

## 2017-11-06 MED ORDER — ONDANSETRON HCL 4 MG/2ML IJ SOLN
INTRAMUSCULAR | Status: DC | PRN
  Administered 2017-11-06: 4 mg via INTRAVENOUS

## 2017-11-06 MED ORDER — MIDAZOLAM HCL 2 MG/2ML IJ SOLN
INTRAMUSCULAR | Status: AC
  Filled 2017-11-06: qty 2

## 2017-11-06 MED ORDER — PROPOFOL 10 MG/ML IV BOLUS
INTRAVENOUS | Status: AC
  Filled 2017-11-06: qty 40

## 2017-11-06 MED ORDER — LIDOCAINE 2% (20 MG/ML) 5 ML SYRINGE
INTRAMUSCULAR | Status: AC
  Filled 2017-11-06: qty 5

## 2017-11-06 SURGICAL SUPPLY — 30 items
BAG URINE DRAINAGE (UROLOGICAL SUPPLIES) ×3 IMPLANT
BNDG CONFORM 2 STRL LF (GAUZE/BANDAGES/DRESSINGS) IMPLANT
CATH FOLEY 2WAY SLVR  5CC 16FR (CATHETERS) ×2
CATH FOLEY 2WAY SLVR 5CC 16FR (CATHETERS) ×1 IMPLANT
GAUZE SPONGE 4X4 16PLY XRAY LF (GAUZE/BANDAGES/DRESSINGS) ×3 IMPLANT
GLOVE BIO SURGEON STRL SZ7.5 (GLOVE) ×6 IMPLANT
GOWN STRL REUS W/ TWL LRG LVL3 (GOWN DISPOSABLE) ×2 IMPLANT
GOWN STRL REUS W/TWL LRG LVL3 (GOWN DISPOSABLE) ×4
HOLDER FOLEY CATH W/STRAP (MISCELLANEOUS) ×3 IMPLANT
HOVERMATT SINGLE USE (MISCELLANEOUS) ×3 IMPLANT
KIT RM TURNOVER CYSTO AR (KITS) ×3 IMPLANT
NEEDLE SPNL 22GX3.5 QUINCKE BK (NEEDLE) IMPLANT
PACK VAGINAL MINOR WOMEN LF (CUSTOM PROCEDURE TRAY) ×3 IMPLANT
PACKING VAGINAL (PACKING) IMPLANT
PAD ABD 8X10 STRL (GAUZE/BANDAGES/DRESSINGS) ×3 IMPLANT
PAD OB MATERNITY 4.3X12.25 (PERSONAL CARE ITEMS) IMPLANT
PLUG CATH AND CAP STER (CATHETERS) IMPLANT
SET IRRIG Y TYPE TUR BLADDER L (SET/KITS/TRAYS/PACK) ×3 IMPLANT
SUT PROLENE 0 SH 30 (SUTURE) IMPLANT
SUT SILK 2 0 30  PSL (SUTURE)
SUT SILK 2 0 30 PSL (SUTURE) IMPLANT
SYR BULB IRRIGATION 50ML (SYRINGE) IMPLANT
SYR CONTROL 10ML LL (SYRINGE) IMPLANT
SYRINGE 10CC LL (SYRINGE) ×3 IMPLANT
TOWEL OR 17X24 6PK STRL BLUE (TOWEL DISPOSABLE) ×6 IMPLANT
TUBE CONNECTING 12'X1/4 (SUCTIONS)
TUBE CONNECTING 12X1/4 (SUCTIONS) IMPLANT
WATER STERILE IRR 3000ML UROMA (IV SOLUTION) ×3 IMPLANT
WATER STERILE IRR 500ML POUR (IV SOLUTION) ×3 IMPLANT
YANKAUER SUCT BULB TIP NO VENT (SUCTIONS) IMPLANT

## 2017-11-06 NOTE — Progress Notes (Signed)
Foley catheter removed intact.  Right hand IV removed intact.  Patient given Zofran and then Ativan for nausea with some relief.  States that she wants to go home. Ok per Dr. Sondra Come.  Assisted patient with dressing and she was discharged in a wheelchair with her husband.

## 2017-11-06 NOTE — Brief Op Note (Signed)
11/06/2017  9:53 AM  PATIENT:  Maureen Vaughan  67 y.o. female  PRE-OPERATIVE DIAGNOSIS:  ENDO CERVIX  POST-OPERATIVE DIAGNOSIS:  ENDO CERVIX  PROCEDURE:  Procedure(s): TANDEM RING INSERTION (N/A)  SURGEON:  Surgeon(s) and Role:    * Gery Pray, MD - Primary  PHYSICIAN ASSISTANT:   ASSISTANTS: none   ANESTHESIA:   general  EBL:  10 mL   BLOOD ADMINISTERED:none  DRAINS: Urinary Catheter (Foley)   LOCAL MEDICATIONS USED:  NONE  SPECIMEN:  No Specimen DISPOSITION OF SPECIMEN:  N/A  COUNTS:  YES  TOURNIQUET:  * No tourniquets in log *  DICTATION: Patient was prepped and draped in the usual sterile fashion and placed in the dorsolithotomy position. Timeout was taken with discussion of length of procedure,  planned procedure and preoperative medications. Patient had a Foley catheter placed without difficulty. The patient then had approximately 250 mL of sterile water placed in the bladder for imaging purposes.  Exam under anesthesia revealed the cervical mass/uterus  had decreased from approximately 6-7 cm down to approximately 4-4-1/2 cm. There was no obvious parametrial extension on rectovaginal examination. Patient proceeded to undergo dilation of the cervical os with ultrasound guidance. Significant hard tumor was noted along the endocervical area and into the endometrial canal. Excellent images were obtained and the uterus was noted to be in a retroverted position. In light of this residual tumor sounding and dilation was difficult. It was noted  what  appeared to be significant residual tumor in the mid portion of the uterus as well as cervical area. Patient had dilation of the cervical region and a 40 mm cervical sleeve was placed.  a 60 40 mm tandem was placed within the endocervical canal and endometrial canal. This was initially placed in the retroverted position but then turned anteriorly and the uterus also turned anteriorly somewhat  with this manipulation. Patient then  had a HDR Ring  with a small shielding placed  at the cervical os region.  A rectal paddle was placed posteriorly to limit dose to the rectum region. Patient tolerated the procedure well. She was subsequent transferred to the recovery room in stable condition. Later in the day the patient will be taken down to radiation oncology for planning and her first high-dose-rate treatment.  PLAN OF CARE: Transferred to radiation oncology for planning and treatment  PATIENT DISPOSITION:  PACU - hemodynamically stable.   Delay start of Pharmacological VTE agent (>24hrs) due to surgical blood loss or risk of bleeding: not applicable

## 2017-11-06 NOTE — Progress Notes (Signed)
IMMEDIATELY FOLLOWING SURGERY: Do not drive or operate machinery for the first twenty four hours after surgery. Do not make any important decisions for twenty four hours after surgery or while taking narcotic pain medications or sedatives. If you develop intractable nausea and vomiting or a severe headache please notify your doctor immediately.   FOLLOW-UP: You do not need to follow up with anesthesia unless specifically instructed to do so.   WOUND CARE INSTRUCTIONS (if applicable): Expect some mild vaginal bleeding, but if large amount of bleeding occurs please contact Dr. Sondra Come at [phone] or the Radiation On-Call physician. Call for any fever greater than 101.0 degrees or increasing vaginal//abdominal pain or trouble urinating.   QUESTIONS?: Please feel free to call your physician or the hospital operator if you have any questions, and they will be happy to assist you. Resume all medications: as listed on your after visit summary. Your next appointment is:  Future Appointments  Date Time Provider [place]  11/08/2017  8:15 AM [facility]-US 1 [facility]-US [place]  11/08/2017  9:30 AM Gery Pray, MD [facility] None  11/08/2017  1:00 PM Gery Pray, MD [facility] None  11/13/2017  7:30 AM WL-US 1 WL-US [place]  11/13/2017  9:00 AM Gery Pray, MD [facility] None  11/13/2017  1:00 PM Gery Pray, MD [place] None  11/15/2017  7:30 AM WL-US 1 WL-US [place]  11/15/2017  9:00 AM Gery Pray, MD [place] None  11/15/2017  1:00 PM Gery Pray, MD [place] None  11/20/2017  9:00 AM WL-US 1 WL-US [place]  11/20/2017 11:00 AM Gery Pray, MD [place] None  11/20/2017  3:00 PM Gery Pray, MD [facility] None  11/27/2017 11:00 AM Gery Pray, MD [facility] None  11/27/2017  3:00 PM Gery Pray, MD [facility] None  12/26/2017  8:00 AM [facility] LAB 6 [facility] None  12/26/2017  8:15 AM [facility] FLUSH NURSE [facility] None  12/27/2017 11:30 AM Heath Lark, MD  [facility] None

## 2017-11-06 NOTE — Interval H&P Note (Signed)
History and Physical Interval Note:  11/06/2017 7:33 AM  Maureen Vaughan  has presented today for surgery, with the diagnosis of ENDO CERVIX  The various methods of treatment have been discussed with the patient and family. After consideration of risks, benefits and other options for treatment, the patient has consented to  Procedure(s): TANDEM RING INSERTION (N/A) as a surgical intervention .  The patient's history has been reviewed, patient examined, no change in status, stable for surgery.  I have reviewed the patient's chart and labs.  Questions were answered to the patient's satisfaction.     Gery Pray

## 2017-11-06 NOTE — Anesthesia Procedure Notes (Signed)
Procedure Name: LMA Insertion Date/Time: 11/06/2017 7:34 AM Performed by: Bonney Aid, CRNA Pre-anesthesia Checklist: Patient identified, Emergency Drugs available, Suction available and Patient being monitored Patient Re-evaluated:Patient Re-evaluated prior to induction Oxygen Delivery Method: Circle system utilized Preoxygenation: Pre-oxygenation with 100% oxygen Induction Type: IV induction Ventilation: Mask ventilation without difficulty LMA: LMA inserted LMA Size: 4.0 Number of attempts: 1 Airway Equipment and Method: Bite block Placement Confirmation: positive ETCO2 Tube secured with: Tape Dental Injury: Teeth and Oropharynx as per pre-operative assessment

## 2017-11-06 NOTE — Transfer of Care (Signed)
Immediate Anesthesia Transfer of Care Note  Patient: Maureen Vaughan  Procedure(s) Performed: TANDEM RING INSERTION (N/A )  Patient Location: PACU  Anesthesia Type:General  Level of Consciousness: drowsy  Airway & Oxygen Therapy: Patient Spontanous Breathing and Patient connected to nasal cannula oxygen  Post-op Assessment: Report given to RN  Post vital signs: Reviewed and stable  Last Vitals: 130/67, 86, 12, 100% Vitals:   11/06/17 0529 11/06/17 0922  BP: (!) 142/64   Pulse: (!) 102   Resp: 19   Temp: 36.6 C (!) (P) 36.4 C  SpO2: 100%     Last Pain:  Vitals:   11/06/17 0529  TempSrc: Oral      Patients Stated Pain Goal: 5 (62/70/35 0093)  Complications: No apparent anesthesia complications130

## 2017-11-06 NOTE — Anesthesia Postprocedure Evaluation (Signed)
Anesthesia Post Note  Patient: Maureen Vaughan  Procedure(s) Performed: TANDEM RING INSERTION (N/A )     Patient location during evaluation: PACU Anesthesia Type: General Level of consciousness: sedated and patient cooperative Pain management: pain level controlled Vital Signs Assessment: post-procedure vital signs reviewed and stable Respiratory status: spontaneous breathing Cardiovascular status: stable Anesthetic complications: no    Last Vitals:  Vitals:   11/06/17 1000 11/06/17 1013  BP: 122/64   Pulse: 82 85  Resp: 13 14  Temp:    SpO2: 100% 100%    Last Pain:  Vitals:   11/06/17 0922  TempSrc:   PainSc: [name]

## 2017-11-06 NOTE — Progress Notes (Signed)
  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  Identity was confirmed.  All relevant records and images related to the planned course of therapy were reviewed.  The patient freely provided informed written consent to proceed with treatment after reviewing the details related to the planned course of therapy. The consent form was witnessed and verified by the simulation staff.  Then, the patient was set-up in a stable reproducible  supine position for radiation therapy.  CT images were obtained.  Surface markings were placed.  The CT images were loaded into the planning software.  Then the target and avoidance structures were contoured.  Treatment planning then occurred.  The radiation prescription was entered and confirmed.   I have requested : Brachytherapy Isodose Plan and Dosimetry Calculations to plan the radiation distribution.    PLAN:  The patient will receive 5.5 Gy in 1 fraction.  Patient will be treated with the tandem ring system using 2 channels for HDR treatment. Iridium 192 will be the high-dose-rate source. The patient will be treated with a 40 mm 60 tandem/ring system.    ________________________________  Blair Promise, PhD, MD

## 2017-11-06 NOTE — OR Nursing (Signed)
TANDEM RING INSERTED BY DR Sondra Come 11/06/17

## 2017-11-06 NOTE — Progress Notes (Signed)
  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 Identity was confirmed. All relevant records and images related to the planned course of therapy were reviewed. The patient freely provided informed written consent to proceed with treatment after reviewing the details related to the planned course of therapy. The consent form was witnessed and verified by the simulation staff. Then, the patient was set-up in a stable reproducible supine position for radiation therapy. The tandem ring system was accessed and fiducial markers were placed within the tandem and ring.   Simple treatment device note: On the operating room the patient had construction of her custom tandem ring system. She will be treated with a 60 tandem/ring system. The patient had placement of a 40 mm tandem. A cervical ring with a small shielding was used for her treatment. A rectal paddle was also part of her custom set up device.  Verification simulation note: An AP and lateral film was obtained through the pelvis area. This was compared to the patient's planning films documenting accurate position of the tandem/ring system for treatment.  High-dose-rate brachytherapy treatment note:  The remote afterloading device was accessed through catheter system and attached to the tandem ring system. Patient then proceeded to undergo her first high-dose-rate treatment directed at the cervix. The patient was prescribed a dose of 5.5 gray to be delivered to the high risk CTV.Marland Kitchen Patient was treated with 2 channels using 22 dwell positions. Treatment time was 351.8 seconds. The patient tolerated the procedure  well. After completion of her therapy, a radiation survey was performed documenting return of the iridium source into the GammaMed safe. The patient was then transferred to the nursing suite. She then had removal of the rectal paddle followed by the tandem and ring system. The patient tolerated the removal well.  PLAN: The patient will return later this week for her second high-dose-rate treatment. ________________________________  Blair Promise, PhD, MD

## 2017-11-07 ENCOUNTER — Telehealth: Payer: Self-pay | Admitting: Oncology

## 2017-11-07 ENCOUNTER — Other Ambulatory Visit: Payer: Self-pay

## 2017-11-07 ENCOUNTER — Encounter (HOSPITAL_BASED_OUTPATIENT_CLINIC_OR_DEPARTMENT_OTHER): Payer: Self-pay | Admitting: Radiation Oncology

## 2017-11-07 ENCOUNTER — Telehealth: Payer: Self-pay | Admitting: Hematology and Oncology

## 2017-11-07 NOTE — Telephone Encounter (Signed)
Called patient and advised her that Dr. Sondra Come spoke to Dr. Denman George and they have decided to proceed with the tandem and ring treatment tomorrow.  She verbalized understanding and said that she vomiting once more last night and then stopped.  She did take a Zofran tablet this morning and feels fine now.

## 2017-11-07 NOTE — Progress Notes (Signed)
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

## 2017-11-07 NOTE — Telephone Encounter (Signed)
Spoke to patient regarding upcoming January appointment updates per 1/21 sch message.

## 2017-11-08 ENCOUNTER — Ambulatory Visit (HOSPITAL_BASED_OUTPATIENT_CLINIC_OR_DEPARTMENT_OTHER)
Admission: RE | Admit: 2017-11-08 | Discharge: 2017-11-08 | Disposition: A | Discharge status: Home / Self Care | Payer: Medicare HMO | Source: Ambulatory Visit | Attending: Radiation Oncology | Admitting: Radiation Oncology

## 2017-11-08 ENCOUNTER — Ambulatory Visit
Admission: RE | Admit: 2017-11-08 | Discharge: 2017-11-08 | Disposition: A | Discharge status: Home / Self Care | Payer: Medicare HMO | Source: Ambulatory Visit | Attending: Radiation Oncology | Admitting: Radiation Oncology

## 2017-11-08 ENCOUNTER — Ambulatory Visit (HOSPITAL_BASED_OUTPATIENT_CLINIC_OR_DEPARTMENT_OTHER): Payer: Medicare HMO | Admitting: Anesthesiology

## 2017-11-08 ENCOUNTER — Encounter (HOSPITAL_BASED_OUTPATIENT_CLINIC_OR_DEPARTMENT_OTHER)
Admission: RE | Disposition: A | Discharge status: Home / Self Care | Payer: Self-pay | Source: Ambulatory Visit | Attending: Radiation Oncology

## 2017-11-08 ENCOUNTER — Ambulatory Visit (HOSPITAL_COMMUNITY)
Admission: RE | Admit: 2017-11-08 | Discharge: 2017-11-08 | Disposition: A | Discharge status: Home / Self Care | Payer: Medicare HMO | Source: Ambulatory Visit | Attending: Radiation Oncology | Admitting: Radiation Oncology

## 2017-11-08 ENCOUNTER — Encounter (HOSPITAL_BASED_OUTPATIENT_CLINIC_OR_DEPARTMENT_OTHER): Payer: Self-pay | Admitting: *Deleted

## 2017-11-08 VITALS — BP 142/77 | HR 76

## 2017-11-08 DIAGNOSIS — Z79899 Other long term (current) drug therapy: Secondary | ICD-10-CM | POA: Insufficient documentation

## 2017-11-08 DIAGNOSIS — C530 Malignant neoplasm of endocervix: Secondary | ICD-10-CM

## 2017-11-08 DIAGNOSIS — C539 Malignant neoplasm of cervix uteri, unspecified: Secondary | ICD-10-CM | POA: Insufficient documentation

## 2017-11-08 HISTORY — PX: TANDEM RING INSERTION: SHX6199

## 2017-11-08 HISTORY — DX: Adverse effect of unspecified anesthetic, initial encounter: T41.45XA

## 2017-11-08 HISTORY — DX: Other complications of anesthesia, initial encounter: T88.59XA

## 2017-11-08 SURGERY — INSERTION, UTERINE TANDEM AND RING OR CYLINDER, FOR BRACHYTHERAPY
Anesthesia: General

## 2017-11-08 MED ORDER — LIDOCAINE 2% (20 MG/ML) 5 ML SYRINGE
INTRAMUSCULAR | Status: DC | PRN
  Administered 2017-11-08: 80 mg via INTRAVENOUS

## 2017-11-08 MED ORDER — LIDOCAINE 2% (20 MG/ML) 5 ML SYRINGE
INTRAMUSCULAR | Status: AC
  Filled 2017-11-08: qty 5

## 2017-11-08 MED ORDER — SCOPOLAMINE 1 MG/3DAYS TD PT72
MEDICATED_PATCH | TRANSDERMAL | Status: AC
  Filled 2017-11-08: qty 1

## 2017-11-08 MED ORDER — DEXAMETHASONE SODIUM PHOSPHATE 10 MG/ML IJ SOLN
INTRAMUSCULAR | Status: AC
  Filled 2017-11-08: qty 1

## 2017-11-08 MED ORDER — ONDANSETRON HCL 4 MG/2ML IJ SOLN
INTRAMUSCULAR | Status: AC
  Filled 2017-11-08: qty 2

## 2017-11-08 MED ORDER — ONDANSETRON HCL 4 MG/2ML IJ SOLN
INTRAMUSCULAR | Status: DC | PRN
  Administered 2017-11-08: 4 mg via INTRAVENOUS

## 2017-11-08 MED ORDER — FENTANYL CITRATE (PF) 100 MCG/2ML IJ SOLN
25.0000 ug | INTRAMUSCULAR | Status: DC | PRN
  Administered 2017-11-08 (×2): 25 ug via INTRAVENOUS
  Filled 2017-11-08: qty 1

## 2017-11-08 MED ORDER — LACTATED RINGERS IV SOLN
INTRAVENOUS | Status: DC
  Filled 2017-11-08: qty 1000

## 2017-11-08 MED ORDER — PROMETHAZINE HCL 25 MG/ML IJ SOLN
6.2500 mg | INTRAMUSCULAR | Status: DC | PRN
  Filled 2017-11-08: qty 1

## 2017-11-08 MED ORDER — LACTATED RINGERS IV SOLN
INTRAVENOUS | Status: DC
  Administered 2017-11-08: 13:00:00 via INTRAVENOUS
  Filled 2017-11-08 (×2): qty 250

## 2017-11-08 MED ORDER — PROPOFOL 10 MG/ML IV BOLUS
INTRAVENOUS | Status: AC
  Filled 2017-11-08: qty 20

## 2017-11-08 MED ORDER — MEPERIDINE HCL 25 MG/ML IJ SOLN
6.2500 mg | INTRAMUSCULAR | Status: DC | PRN
  Filled 2017-11-08: qty 1

## 2017-11-08 MED ORDER — HYDROMORPHONE HCL 1 MG/ML IJ SOLN
0.5000 mg | Freq: Once | INTRAMUSCULAR | Status: AC
  Administered 2017-11-08: 0.5 mg via INTRAVENOUS
  Filled 2017-11-08: qty 1

## 2017-11-08 MED ORDER — OXYCODONE HCL 5 MG/5ML PO SOLN
5.0000 mg | Freq: Once | ORAL | Status: DC | PRN
  Filled 2017-11-08: qty 5

## 2017-11-08 MED ORDER — HYDROMORPHONE HCL 1 MG/ML IJ SOLN
INTRAMUSCULAR | Status: AC
  Filled 2017-11-08: qty 1

## 2017-11-08 MED ORDER — PROPOFOL 10 MG/ML IV BOLUS
INTRAVENOUS | Status: DC | PRN
  Administered 2017-11-08: 180 mg via INTRAVENOUS

## 2017-11-08 MED ORDER — LACTATED RINGERS IV SOLN
INTRAVENOUS | Status: DC
  Administered 2017-11-08: 07:00:00 via INTRAVENOUS
  Filled 2017-11-08 (×2): qty 1000

## 2017-11-08 MED ORDER — FENTANYL CITRATE (PF) 100 MCG/2ML IJ SOLN
INTRAMUSCULAR | Status: AC
Start: 2017-11-08 — End: ?
  Filled 2017-11-08: qty 2

## 2017-11-08 MED ORDER — MIDAZOLAM HCL 2 MG/2ML IJ SOLN
INTRAMUSCULAR | Status: AC
  Filled 2017-11-08: qty 2

## 2017-11-08 MED ORDER — OXYCODONE HCL 5 MG PO TABS
5.0000 mg | ORAL_TABLET | Freq: Once | ORAL | Status: DC | PRN
  Filled 2017-11-08: qty 1

## 2017-11-08 MED ORDER — FENTANYL CITRATE (PF) 100 MCG/2ML IJ SOLN
INTRAMUSCULAR | Status: DC | PRN
  Administered 2017-11-08: 25 ug via INTRAVENOUS
  Administered 2017-11-08: 50 ug via INTRAVENOUS
  Administered 2017-11-08: 25 ug via INTRAVENOUS

## 2017-11-08 MED ORDER — FENTANYL CITRATE (PF) 100 MCG/2ML IJ SOLN
INTRAMUSCULAR | Status: AC
  Filled 2017-11-08: qty 2

## 2017-11-08 MED ORDER — MIDAZOLAM HCL 2 MG/2ML IJ SOLN
INTRAMUSCULAR | Status: DC | PRN
  Administered 2017-11-08: 2 mg via INTRAVENOUS

## 2017-11-08 MED ORDER — DEXAMETHASONE SODIUM PHOSPHATE 10 MG/ML IJ SOLN
INTRAMUSCULAR | Status: DC | PRN
  Administered 2017-11-08: 10 mg via INTRAVENOUS

## 2017-11-08 MED ORDER — SCOPOLAMINE 1 MG/3DAYS TD PT72
1.0000 | MEDICATED_PATCH | TRANSDERMAL | Status: DC
  Administered 2017-11-08: 1.5 mg via TRANSDERMAL
  Filled 2017-11-08: qty 1

## 2017-11-08 SURGICAL SUPPLY — 29 items
BAG URINE DRAINAGE (UROLOGICAL SUPPLIES) ×3 IMPLANT
BNDG CONFORM 2 STRL LF (GAUZE/BANDAGES/DRESSINGS) IMPLANT
CATH FOLEY 2WAY SLVR  5CC 16FR (CATHETERS) ×2
CATH FOLEY 2WAY SLVR 5CC 16FR (CATHETERS) ×1 IMPLANT
GLOVE BIO SURGEON STRL SZ7.5 (GLOVE) ×6 IMPLANT
GOWN STRL REUS W/ TWL LRG LVL3 (GOWN DISPOSABLE) ×2 IMPLANT
GOWN STRL REUS W/TWL LRG LVL3 (GOWN DISPOSABLE) ×4
HOLDER FOLEY CATH W/STRAP (MISCELLANEOUS) ×3 IMPLANT
HOVERMATT SINGLE USE (MISCELLANEOUS) ×3 IMPLANT
KIT RM TURNOVER CYSTO AR (KITS) ×3 IMPLANT
NEEDLE SPNL 22GX3.5 QUINCKE BK (NEEDLE) IMPLANT
PACK VAGINAL MINOR WOMEN LF (CUSTOM PROCEDURE TRAY) ×3 IMPLANT
PACKING VAGINAL (PACKING) IMPLANT
PAD ABD 8X10 STRL (GAUZE/BANDAGES/DRESSINGS) ×3 IMPLANT
PAD OB MATERNITY 4.3X12.25 (PERSONAL CARE ITEMS) IMPLANT
PLUG CATH AND CAP STER (CATHETERS) ×3 IMPLANT
SET IRRIG Y TYPE TUR BLADDER L (SET/KITS/TRAYS/PACK) ×3 IMPLANT
SUT PROLENE 0 SH 30 (SUTURE) IMPLANT
SUT SILK 2 0 30  PSL (SUTURE)
SUT SILK 2 0 30 PSL (SUTURE) IMPLANT
SYR BULB IRRIGATION 50ML (SYRINGE) IMPLANT
SYR CONTROL 10ML LL (SYRINGE) ×3 IMPLANT
SYRINGE 10CC LL (SYRINGE) IMPLANT
TOWEL OR 17X24 6PK STRL BLUE (TOWEL DISPOSABLE) ×6 IMPLANT
TUBE CONNECTING 12'X1/4 (SUCTIONS)
TUBE CONNECTING 12X1/4 (SUCTIONS) IMPLANT
WATER STERILE IRR 3000ML UROMA (IV SOLUTION) ×3 IMPLANT
WATER STERILE IRR 500ML POUR (IV SOLUTION) ×3 IMPLANT
YANKAUER SUCT BULB TIP NO VENT (SUCTIONS) IMPLANT

## 2017-11-08 NOTE — Anesthesia Procedure Notes (Signed)
Procedure Name: LMA Insertion Date/Time: 11/08/2017 8:54 AM Performed by: Suan Halter, CRNA Pre-anesthesia Checklist: Patient identified, Emergency Drugs available, Suction available and Patient being monitored Patient Re-evaluated:Patient Re-evaluated prior to induction Oxygen Delivery Method: Circle system utilized Preoxygenation: Pre-oxygenation with 100% oxygen Induction Type: IV induction Ventilation: Mask ventilation without difficulty LMA: LMA inserted LMA Size: 4.0 Number of attempts: 1 Airway Equipment and Method: Bite block Placement Confirmation: positive ETCO2 Tube secured with: Tape Dental Injury: Teeth and Oropharynx as per pre-operative assessment

## 2017-11-08 NOTE — Brief Op Note (Signed)
11/08/2017  1:24 PM  PATIENT:  Maureen Vaughan  67 y.o. female  PRE-OPERATIVE DIAGNOSIS:  ENDO CERVIX  POST-OPERATIVE DIAGNOSIS:  ENDO CERVIX  PROCEDURE:  Procedure(s): TANDEM RING INSERTION (N/A)  SURGEON:  Surgeon(s) and Role:    * Gery Pray, MD - Primary  PHYSICIAN ASSISTANT:   ASSISTANTS: none   ANESTHESIA:   general  EBL:  0 mL   BLOOD ADMINISTERED:none  DRAINS: Urinary Catheter (Foley)   LOCAL MEDICATIONS USED:  NONE  SPECIMEN:  No Specimen  DISPOSITION OF SPECIMEN:  N/A  COUNTS:  YES  TOURNIQUET:  * No tourniquets in log *  DICTATION: Patient was prepped and draped in the usual sterile fashion and placed in the dorsolithotomy position. Timeout was taken with discussion of length of procedure,  planned procedure and preoperative medications. Patient had a Foley catheter placed without difficulty. The patient then had approximately 250 mL of sterile water placed in the bladder for imaging purposes.  Exam under anesthesia revealed the cervical mass/uterus  had decreased from approximately 6-7 cm down to approximately 4-4-1/2 cm. There was no obvious parametrial extension on rectovaginal examination. Patient proceeded to undergo dilation of the cervical os with ultrasound guidance. Significant hard tumor was noted along the endocervical area and into the endometrial canal. Excellent images were obtained and the uterus was noted to be in a retroverted position. In light of this residual tumor sounding and dilation was difficult. It was noted  what  appeared to be significant residual tumor in the mid portion of the uterus as well as cervical area. Patient had dilation of the cervical region and a 60 mm cervical sleeve was placed.  a 60 60 mm tandem was placed within the endocervical canal and endometrial canal. This was initially placed in the retroverted position but then turned anteriorly and the uterus also turned anteriorly somewhat  with this manipulation. Patient then  had a HDR Ring 60 degrees  with a small shielding placed  at the cervical os region.  A rectal paddle was placed posteriorly to limit dose to the rectum region. Patient tolerated the procedure well. She was subsequent transferred to the recovery room in stable condition. Later in the day the patient will be taken down to radiation oncology for planning and her second high-dose-rate treatment.    PLAN OF CARE: Transferred to radiation oncology for planning and treatment  PATIENT DISPOSITION:  PACU - hemodynamically stable.   Delay start of Pharmacological VTE agent (>24hrs) due to surgical blood loss or risk of bleeding: not applicable

## 2017-11-08 NOTE — Progress Notes (Signed)
  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 Identity was confirmed. All relevant records and images related to the planned course of therapy were reviewed. The patient freely provided informed written consent to proceed with treatment after reviewing the details related to the planned course of therapy. The consent form was witnessed and verified by the simulation staff. Then, the patient was set-up in a stable reproducible supine position for radiation therapy. The tandem ring system was accessed and fiducial markers were placed within the tandem and ring.   Simple treatment device note: On the operating room the patient had construction of her custom tandem ring system. She will be treated with a 60 tandem/ring system. The patient had placement of a 40 mm tandem. A cervical ring with a small shielding was used for her treatment. A rectal paddle was also part of her custom set up device.  Verification simulation note: An AP and lateral film was obtained through the pelvis area. This was compared to the patient's planning films documenting accurate position of the tandem/ring system for treatment.  High-dose-rate brachytherapy treatment note:  The remote afterloading device was accessed through catheter system and attached to the tandem ring system. Patient then proceeded to undergo her second high-dose-rate treatment directed at the cervix. The patient was prescribed a dose of 6.0 gray to be delivered to the high risk CTV.Marland Kitchen Patient was treated with 2 channels using 22 dwell positions. Treatment time was 463.6 seconds. The patient tolerated the procedure  well. After completion of her therapy, a radiation survey was performed documenting return of the iridium source into the GammaMed safe. The patient was then transferred to the nursing suite. She then had removal of the rectal paddle followed by the tandem and ring system. The patient tolerated the removal well.  PLAN: The patient will return for her third high-dose-rate treatment early next week. ________________________________  Blair Promise, PhD, MD  This document serves as a record of services personally performed by Gery Pray, MD. It was created on his behalf by Bethann Humble, a trained medical scribe. The creation of this record is based on the scribe's personal observations and the provider's statements to them. This document has been checked and approved by the attending provider.

## 2017-11-08 NOTE — Progress Notes (Signed)
  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  Identity was confirmed.  All relevant records and images related to the planned course of therapy were reviewed.  The patient freely provided informed written consent to proceed with treatment after reviewing the details related to the planned course of therapy. The consent form was witnessed and verified by the simulation staff.  Then, the patient was set-up in a stable reproducible  supine position for radiation therapy.  CT images were obtained.  Surface markings were placed.  The CT images were loaded into the planning software.  Then the target and avoidance structures were contoured.  Treatment planning then occurred.  The radiation prescription was entered and confirmed.   I have requested : Brachytherapy Isodose Plan and Dosimetry Calculations to plan the radiation distribution.    PLAN:  The patient will receive 5.5 Gy in 1 fraction.  Patient will be treated with the tandem ring system using 2 channels for HDR treatment. Iridium 192 will be the high-dose-rate source. The patient will be treated with a 60 mm 60 tandem/ring system.    ________________________________  Blair Promise, PhD, MD  This document serves as a record of services personally performed by Gery Pray, MD. It was created on his behalf by Bethann Humble, a trained medical scribe. The creation of this record is based on the scribe's personal observations and the provider's statements to them. This document has been checked and approved by the attending provider.

## 2017-11-08 NOTE — Transfer of Care (Signed)
Immediate Anesthesia Transfer of Care Note  Patient: Maureen Vaughan  Procedure(s) Performed: Procedure(s) (LRB): TANDEM RING INSERTION (N/A)  Patient Location: PACU  Anesthesia Type: General  Level of Consciousness: awake, oriented, sedated and patient cooperative  Airway & Oxygen Therapy: Patient Spontanous Breathing and Patient connected to face mask oxygen  Post-op Assessment: Report given to PACU RN and Post -op Vital signs reviewed and stable  Post vital signs: Reviewed and stable  Complications: No apparent anesthesia complications  Last Vitals:  Vitals:   11/08/17 0614 11/08/17 0946  BP: 134/70   Pulse: 94   Resp: 18   Temp: 37.2 C (P) 36.4 C  SpO2: 98%     Last Pain:  Vitals:   11/08/17 0614  TempSrc: Oral      Patients Stated Pain Goal: 5 (15/52/08 0223)  Complications: No Anesthetic complications

## 2017-11-08 NOTE — Anesthesia Preprocedure Evaluation (Addendum)
Anesthesia Evaluation  Patient identified by MRN, date of birth, ID band Patient awake    Reviewed: Allergy & Precautions, NPO status , Patient's Chart, lab work & pertinent test results  Airway Mallampati: II  TM Distance: >3 FB Neck ROM: Full    Dental  (+) Teeth Intact, Dental Advisory Given   Pulmonary neg pulmonary ROS,    breath sounds clear to auscultation       Cardiovascular negative cardio ROS   Rhythm:Regular Rate:Normal     Neuro/Psych negative neurological ROS  negative psych ROS   GI/Hepatic negative GI ROS, Neg liver ROS,   Endo/Other  negative endocrine ROS  Renal/GU negative Renal ROS     Musculoskeletal negative musculoskeletal ROS (+)   Abdominal Normal abdominal exam  (+)   Peds  Hematology negative hematology ROS (+)   Anesthesia Other Findings   Reproductive/Obstetrics                            Lab Results  Component Value Date   WBC 7.7 11/05/2017   HGB 10.8 (L) 11/05/2017   HCT 33.6 (L) 11/05/2017   MCV 91.1 11/05/2017   PLT 213 11/05/2017   Lab Results  Component Value Date   CREATININE 0.99 11/05/2017   BUN 9 11/05/2017   NA 143 11/05/2017   K 4.3 11/05/2017   CL 106 11/05/2017   CO2 27 11/05/2017   Lab Results  Component Value Date   INR 1.02 09/10/2017     Anesthesia Physical Anesthesia Plan  ASA: II  Anesthesia Plan: General   Post-op Pain Management:    Induction: Intravenous  PONV Risk Score and Plan: 4 or greater and Ondansetron, Dexamethasone, Midazolam and Scopolamine patch - Pre-op  Airway Management Planned: LMA  Additional Equipment: None  Intra-op Plan:   Post-operative Plan: Extubation in OR  Informed Consent: I have reviewed the patients History and Physical, chart, labs and discussed the procedure including the risks, benefits and alternatives for the proposed anesthesia with the patient or authorized representative  who has indicated his/her understanding and acceptance.   Dental advisory given  Plan Discussed with: CRNA  Anesthesia Plan Comments:         Anesthesia Quick Evaluation

## 2017-11-08 NOTE — Interval H&P Note (Signed)
History and Physical Interval Note:  11/08/2017 8:25 AM  Maureen Vaughan  has presented today for surgery, with the diagnosis of ENDO CERVIX  The various methods of treatment have been discussed with the patient and family. After consideration of risks, benefits and other options for treatment, the patient has consented to  Procedure(s): TANDEM RING INSERTION (N/A) as a surgical intervention .  The patient's history has been reviewed, patient examined, no change in status, stable for surgery.  I have reviewed the patient's chart and labs.  Questions were answered to the patient's satisfaction.     Gery Pray

## 2017-11-09 ENCOUNTER — Telehealth: Payer: Self-pay | Admitting: Hematology and Oncology

## 2017-11-09 ENCOUNTER — Encounter (HOSPITAL_BASED_OUTPATIENT_CLINIC_OR_DEPARTMENT_OTHER): Payer: Self-pay | Admitting: Radiation Oncology

## 2017-11-09 ENCOUNTER — Inpatient Hospital Stay: Payer: Medicare HMO

## 2017-11-09 DIAGNOSIS — C539 Malignant neoplasm of cervix uteri, unspecified: Secondary | ICD-10-CM

## 2017-11-09 DIAGNOSIS — R918 Other nonspecific abnormal finding of lung field: Secondary | ICD-10-CM

## 2017-11-09 DIAGNOSIS — C530 Malignant neoplasm of endocervix: Secondary | ICD-10-CM

## 2017-11-09 LAB — COMPREHENSIVE METABOLIC PANEL
ALBUMIN: 3.6 g/dL (ref 3.5–5.0)
ALK PHOS: 72 U/L (ref 40–150)
ALT: 21 U/L (ref 0–55)
ANION GAP: 10 (ref 3–11)
AST: 22 U/L (ref 5–34)
BILIRUBIN TOTAL: 0.3 mg/dL (ref 0.2–1.2)
BUN: 12 mg/dL (ref 7–26)
CALCIUM: 9.1 mg/dL (ref 8.4–10.4)
CO2: 28 mmol/L (ref 22–29)
CREATININE: 1.02 mg/dL (ref 0.60–1.10)
Chloride: 104 mmol/L (ref 98–109)
GFR calc Af Amer: >60 mL/min (ref >60)
GFR calc non Af Amer: 56 mL/min — ABNORMAL LOW (ref >60)
GLUCOSE: 98 mg/dL (ref 70–140)
Potassium: 3.9 mmol/L (ref 3.3–4.7)
Sodium: 142 mmol/L (ref 136–145)
TOTAL PROTEIN: 6.7 g/dL (ref 6.4–8.3)

## 2017-11-09 LAB — CBC WITH DIFFERENTIAL/PLATELET
BASOS ABS: 0 10*3/uL (ref 0.0–0.1)
BASOS PCT: 0 %
EOS ABS: 0 10*3/uL (ref 0.0–0.5)
EOS PCT: 0 %
HCT: 30.6 % — ABNORMAL LOW (ref 34.8–46.6)
Hemoglobin: 10.3 g/dL — ABNORMAL LOW (ref 11.6–15.9)
Lymphocytes Relative: 7 %
Lymphs Abs: 0.4 10*3/uL — ABNORMAL LOW (ref 0.9–3.3)
MCH: 29.7 pg (ref 25.1–34.0)
MCHC: 33.5 g/dL (ref 31.5–36.0)
MCV: 88.7 fL (ref 79.5–101.0)
MONO ABS: 0.6 10*3/uL (ref 0.1–0.9)
Monocytes Relative: 11 %
Neutro Abs: 4.6 10*3/uL (ref 1.5–6.5)
Neutrophils Relative %: 82 %
Platelets: 170 10*3/uL (ref 145–400)
RBC: 3.45 MIL/uL — ABNORMAL LOW (ref 3.70–5.45)
RDW: 17.8 % — AB (ref 11.2–16.1)
WBC: 5.6 10*3/uL (ref 3.9–10.3)

## 2017-11-09 LAB — MAGNESIUM: Magnesium: 1.8 mg/dL (ref 1.5–2.5)

## 2017-11-09 MED ORDER — TBO-FILGRASTIM 480 MCG/0.8ML ~~LOC~~ SOSY
PREFILLED_SYRINGE | SUBCUTANEOUS | Status: AC
  Filled 2017-11-09: qty 0.8

## 2017-11-09 MED ORDER — TBO-FILGRASTIM 480 MCG/0.8ML ~~LOC~~ SOSY: 480.0000 ug | PREFILLED_SYRINGE | Freq: Once | SUBCUTANEOUS | Status: DC

## 2017-11-09 MED ORDER — HEPARIN SOD (PORK) LOCK FLUSH 100 UNIT/ML IV SOLN
500.0000 [IU] | Freq: Once | INTRAVENOUS | Status: AC
  Administered 2017-11-09: 500 [IU]
  Filled 2017-11-09: qty 5

## 2017-11-09 MED ORDER — SODIUM CHLORIDE 0.9% FLUSH
10.0000 mL | Freq: Once | INTRAVENOUS | Status: AC
  Administered 2017-11-09: 10 mL
  Filled 2017-11-09: qty 10

## 2017-11-09 NOTE — Addendum Note (Signed)
Encounter addended by: Jacqulyn Liner, RN on: 11/09/2017 7:20 AM  Actions taken: LDA properties accepted, Flowsheet accepted, Sign clinical note, Charge Capture section accepted

## 2017-11-09 NOTE — Anesthesia Postprocedure Evaluation (Signed)
Anesthesia Post Note  Patient: Erik Nessel  Procedure(s) Performed: TANDEM RING INSERTION (N/A )     Patient location during evaluation: PACU Anesthesia Type: General Level of consciousness: awake and alert Pain management: pain level controlled Vital Signs Assessment: post-procedure vital signs reviewed and stable Respiratory status: spontaneous breathing, nonlabored ventilation, respiratory function stable and patient connected to nasal cannula oxygen Cardiovascular status: blood pressure returned to baseline and stable Postop Assessment: no apparent nausea or vomiting Anesthetic complications: no    Last Vitals:  Vitals:   11/08/17 1000 11/08/17 1015  BP: (!) 151/71 140/76  Pulse: 73 81  Resp: 20 18  Temp:    SpO2: 100% 96%                Effie Berkshire

## 2017-11-09 NOTE — Telephone Encounter (Signed)
Spoke to patient regarding upcoming February appointments per 1/25 sch message

## 2017-11-09 NOTE — Patient Instructions (Signed)
Tbo-Filgrastim injection What is this medicine? TBO-FILGRASTIM (T B O fil GRA stim) is a granulocyte colony-stimulating factor that stimulates the growth of neutrophils, a type of white blood cell important in the body's fight against infection. It is used to reduce the incidence of fever and infection in patients with certain types of cancer who are receiving chemotherapy that affects the bone marrow. This medicine may be used for other purposes; ask your health care provider or pharmacist if you have questions. COMMON BRAND NAME(S): Granix What should I tell my health care provider before I take this medicine? They need to know if you have any of these conditions: -bone scan or tests planned -kidney disease -sickle cell anemia -an unusual or allergic reaction to tbo-filgrastim, filgrastim, pegfilgrastim, other medicines, foods, dyes, or preservatives -pregnant or trying to get pregnant -breast-feeding How should I use this medicine? This medicine is for injection under the skin. If you get this medicine at home, you will be taught how to prepare and give this medicine. Refer to the Instructions for Use that come with your medication packaging. Use exactly as directed. Take your medicine at regular intervals. Do not take your medicine more often than directed. It is important that you put your used needles and syringes in a special sharps container. Do not put them in a trash can. If you do not have a sharps container, call your pharmacist or healthcare provider to get one. Talk to your pediatrician regarding the use of this medicine in children. Special care may be needed. Overdosage: If you think you have taken too much of this medicine contact a poison control center or emergency room at once. NOTE: This medicine is only for you. Do not share this medicine with others. What if I miss a dose? It is important not to miss your dose. Call your doctor or health care professional if you miss a  dose. What may interact with this medicine? This medicine may interact with the following medications: -medicines that may cause a release of neutrophils, such as lithium This list may not describe all possible interactions. Give your health care provider a list of all the medicines, herbs, non-prescription drugs, or dietary supplements you use. Also tell them if you smoke, drink alcohol, or use illegal drugs. Some items may interact with your medicine. What should I watch for while using this medicine? You may need blood work done while you are taking this medicine. What side effects may I notice from receiving this medicine? Side effects that you should report to your doctor or health care professional as soon as possible: -allergic reactions like skin rash, itching or hives, swelling of the face, lips, or tongue -blood in the urine -dark urine -dizziness -fast heartbeat -feeling faint -shortness of breath or breathing problems -signs and symptoms of infection like fever or chills; cough; or sore throat -signs and symptoms of kidney injury like trouble passing urine or change in the amount of urine -stomach or side pain, or pain at the shoulder -sweating -swelling of the legs, ankles, or abdomen -tiredness Side effects that usually do not require medical attention (report to your doctor or health care professional if they continue or are bothersome): -bone pain -headache -muscle pain -vomiting This list may not describe all possible side effects. Call your doctor for medical advice about side effects. You may report side effects to FDA at 1-800-FDA-1088. Where should I keep my medicine? Keep out of the reach of children. Store in a refrigerator between   2 and 8 degrees C (36 and 46 degrees F). Keep in carton to protect from light. Throw away this medicine if it is left out of the refrigerator for more than 5 consecutive days. Throw away any unused medicine after the expiration  date. NOTE: This sheet is a summary. It may not cover all possible information. If you have questions about this medicine, talk to your doctor, pharmacist, or health care provider.  2018 [employer]vier/Gold Standard (2015-11-22 19:07:04)  

## 2017-11-09 NOTE — Progress Notes (Signed)
Foley catheter removed intact.  Right AC IV removed intact.  Pressure and bandaid applied.  Patient given discharge instructions and then was taken to the lobby in a wheelchair with her family.

## 2017-11-12 ENCOUNTER — Ambulatory Visit: Payer: Medicare HMO | Admitting: Radiation Oncology

## 2017-11-12 ENCOUNTER — Telehealth: Payer: Self-pay | Admitting: *Deleted

## 2017-11-12 NOTE — Telephone Encounter (Signed)
-----   Message from Heath Lark, MD sent at 11/09/2017  8:45 AM EST ----- Regarding: labs OK Tell I will order labs on 2/1 and 2/8 ----- Message ----- From: Interface, Lab In [facility] Sent: 11/09/2017   8:38 AM To: Heath Lark, MD

## 2017-11-12 NOTE — Telephone Encounter (Signed)
Notified of message below

## 2017-11-12 NOTE — Progress Notes (Signed)
Spoke with: Grisela NPO:  After Midnight, no gum, candy, or mints   Arrival time: 5:45AM Labs: CBC w/diff, CMP, Mag in epic 11/09/17 AM medications: None Pre op orders: Yes Ride home: Spouse

## 2017-11-13 ENCOUNTER — Ambulatory Visit (HOSPITAL_BASED_OUTPATIENT_CLINIC_OR_DEPARTMENT_OTHER): Payer: Medicare HMO | Admitting: Certified Registered"

## 2017-11-13 ENCOUNTER — Encounter (HOSPITAL_BASED_OUTPATIENT_CLINIC_OR_DEPARTMENT_OTHER)
Admission: RE | Disposition: A | Discharge status: Home / Self Care | Payer: Self-pay | Source: Ambulatory Visit | Attending: Radiation Oncology

## 2017-11-13 ENCOUNTER — Other Ambulatory Visit: Payer: Medicare HMO

## 2017-11-13 ENCOUNTER — Ambulatory Visit
Admission: RE | Admit: 2017-11-13 | Discharge: 2017-11-13 | Disposition: A | Discharge status: Home / Self Care | Payer: Medicare HMO | Source: Ambulatory Visit | Attending: Radiation Oncology | Admitting: Radiation Oncology

## 2017-11-13 ENCOUNTER — Encounter (HOSPITAL_BASED_OUTPATIENT_CLINIC_OR_DEPARTMENT_OTHER): Payer: Self-pay

## 2017-11-13 ENCOUNTER — Ambulatory Visit (HOSPITAL_COMMUNITY)
Admission: RE | Admit: 2017-11-13 | Discharge: 2017-11-13 | Disposition: A | Discharge status: Home / Self Care | Payer: Medicare HMO | Source: Ambulatory Visit | Attending: Radiation Oncology | Admitting: Radiation Oncology

## 2017-11-13 ENCOUNTER — Ambulatory Visit (HOSPITAL_BASED_OUTPATIENT_CLINIC_OR_DEPARTMENT_OTHER)
Admission: RE | Admit: 2017-11-13 | Discharge: 2017-11-13 | Disposition: A | Discharge status: Home / Self Care | Payer: Medicare HMO | Source: Ambulatory Visit | Attending: Radiation Oncology | Admitting: Radiation Oncology

## 2017-11-13 VITALS — BP 132/59 | HR 102

## 2017-11-13 DIAGNOSIS — Z9221 Personal history of antineoplastic chemotherapy: Secondary | ICD-10-CM | POA: Diagnosis not present

## 2017-11-13 DIAGNOSIS — R590 Localized enlarged lymph nodes: Secondary | ICD-10-CM | POA: Diagnosis not present

## 2017-11-13 DIAGNOSIS — C539 Malignant neoplasm of cervix uteri, unspecified: Secondary | ICD-10-CM | POA: Insufficient documentation

## 2017-11-13 DIAGNOSIS — C530 Malignant neoplasm of endocervix: Secondary | ICD-10-CM

## 2017-11-13 DIAGNOSIS — Z800 Family history of malignant neoplasm of digestive organs: Secondary | ICD-10-CM | POA: Diagnosis not present

## 2017-11-13 HISTORY — PX: TANDEM RING INSERTION: SHX6199

## 2017-11-13 SURGERY — INSERTION, UTERINE TANDEM AND RING OR CYLINDER, FOR BRACHYTHERAPY
Anesthesia: General | Site: Vagina

## 2017-11-13 MED ORDER — HYDROMORPHONE HCL 1 MG/ML IJ SOLN
INTRAMUSCULAR | Status: AC
  Filled 2017-11-13: qty 1

## 2017-11-13 MED ORDER — ONDANSETRON HCL 4 MG/2ML IJ SOLN
INTRAMUSCULAR | Status: DC | PRN
  Administered 2017-11-13: 4 mg via INTRAVENOUS

## 2017-11-13 MED ORDER — PHENYLEPHRINE 40 MCG/ML (10ML) SYRINGE FOR IV PUSH (FOR BLOOD PRESSURE SUPPORT)
PREFILLED_SYRINGE | INTRAVENOUS | Status: AC
  Filled 2017-11-13: qty 10

## 2017-11-13 MED ORDER — PROPOFOL 10 MG/ML IV BOLUS
INTRAVENOUS | Status: AC
  Filled 2017-11-13: qty 40

## 2017-11-13 MED ORDER — LIDOCAINE 2% (20 MG/ML) 5 ML SYRINGE
INTRAMUSCULAR | Status: DC | PRN
  Administered 2017-11-13: 60 mg via INTRAVENOUS

## 2017-11-13 MED ORDER — SODIUM CHLORIDE 0.9 % IR SOLN
Status: DC | PRN
  Administered 2017-11-13: 1000 mL

## 2017-11-13 MED ORDER — FENTANYL CITRATE (PF) 100 MCG/2ML IJ SOLN
INTRAMUSCULAR | Status: DC | PRN
  Administered 2017-11-13: 25 ug via INTRAVENOUS
  Administered 2017-11-13: 50 ug via INTRAVENOUS
  Administered 2017-11-13: 25 ug via INTRAVENOUS

## 2017-11-13 MED ORDER — MIDAZOLAM HCL 2 MG/2ML IJ SOLN
INTRAMUSCULAR | Status: DC | PRN
  Administered 2017-11-13: 2 mg via INTRAVENOUS

## 2017-11-13 MED ORDER — ONDANSETRON HCL 4 MG/2ML IJ SOLN
INTRAMUSCULAR | Status: AC
  Filled 2017-11-13: qty 2

## 2017-11-13 MED ORDER — HYDROMORPHONE HCL 1 MG/ML IJ SOLN
0.5000 mg | Freq: Once | INTRAMUSCULAR | Status: AC
  Administered 2017-11-13: 0.5 mg via INTRAVENOUS
  Filled 2017-11-13: qty 1

## 2017-11-13 MED ORDER — SCOPOLAMINE 1 MG/3DAYS TD PT72
1.0000 | MEDICATED_PATCH | TRANSDERMAL | Status: DC
  Administered 2017-11-13: 1.5 mg via TRANSDERMAL
  Filled 2017-11-13: qty 1

## 2017-11-13 MED ORDER — SCOPOLAMINE 1 MG/3DAYS TD PT72
MEDICATED_PATCH | TRANSDERMAL | Status: AC
  Filled 2017-11-13: qty 1

## 2017-11-13 MED ORDER — PROPOFOL 10 MG/ML IV BOLUS
INTRAVENOUS | Status: DC | PRN
  Administered 2017-11-13: 160 mg via INTRAVENOUS

## 2017-11-13 MED ORDER — OXYCODONE HCL 5 MG PO TABS
5.0000 mg | ORAL_TABLET | Freq: Once | ORAL | Status: DC | PRN
  Filled 2017-11-13: qty 1

## 2017-11-13 MED ORDER — OXYCODONE HCL 5 MG/5ML PO SOLN
5.0000 mg | Freq: Once | ORAL | Status: DC | PRN
  Filled 2017-11-13: qty 5

## 2017-11-13 MED ORDER — MIDAZOLAM HCL 2 MG/2ML IJ SOLN
INTRAMUSCULAR | Status: AC
  Filled 2017-11-13: qty 2

## 2017-11-13 MED ORDER — HYDROMORPHONE HCL 1 MG/ML IJ SOLN
0.2500 mg | INTRAMUSCULAR | Status: DC | PRN
  Administered 2017-11-13: 0.5 mg via INTRAVENOUS
  Administered 2017-11-13 (×2): 0.25 mg via INTRAVENOUS
  Filled 2017-11-13: qty 0.5

## 2017-11-13 MED ORDER — DEXAMETHASONE SODIUM PHOSPHATE 10 MG/ML IJ SOLN
INTRAMUSCULAR | Status: AC
  Filled 2017-11-13: qty 1

## 2017-11-13 MED ORDER — LIDOCAINE 2% (20 MG/ML) 5 ML SYRINGE
INTRAMUSCULAR | Status: AC
  Filled 2017-11-13: qty 5

## 2017-11-13 MED ORDER — MEPERIDINE HCL 25 MG/ML IJ SOLN
6.2500 mg | INTRAMUSCULAR | Status: DC | PRN
  Filled 2017-11-13: qty 1

## 2017-11-13 MED ORDER — FENTANYL CITRATE (PF) 100 MCG/2ML IJ SOLN
INTRAMUSCULAR | Status: AC
  Filled 2017-11-13: qty 2

## 2017-11-13 MED ORDER — PHENYLEPHRINE 40 MCG/ML (10ML) SYRINGE FOR IV PUSH (FOR BLOOD PRESSURE SUPPORT)
PREFILLED_SYRINGE | INTRAVENOUS | Status: DC | PRN
  Administered 2017-11-13 (×2): 80 ug via INTRAVENOUS

## 2017-11-13 MED ORDER — DEXAMETHASONE SODIUM PHOSPHATE 10 MG/ML IJ SOLN
INTRAMUSCULAR | Status: DC | PRN
  Administered 2017-11-13: 10 mg via INTRAVENOUS

## 2017-11-13 MED ORDER — PROMETHAZINE HCL 25 MG/ML IJ SOLN
6.2500 mg | INTRAMUSCULAR | Status: DC | PRN
  Filled 2017-11-13: qty 1

## 2017-11-13 MED ORDER — LACTATED RINGERS IV SOLN
INTRAVENOUS | Status: DC
  Administered 2017-11-13 (×2): via INTRAVENOUS
  Filled 2017-11-13 (×2): qty 1000

## 2017-11-13 SURGICAL SUPPLY — 37 items
BAG URINE DRAINAGE (UROLOGICAL SUPPLIES) ×3 IMPLANT
BNDG CONFORM 2 STRL LF (GAUZE/BANDAGES/DRESSINGS) IMPLANT
CATH FOLEY 2WAY SLVR  5CC 16FR (CATHETERS) ×2
CATH FOLEY 2WAY SLVR 5CC 16FR (CATHETERS) ×1 IMPLANT
GLOVE BIO SURGEON STRL SZ7.5 (GLOVE) ×9 IMPLANT
GLOVE BIO SURGEON STRL SZ8.5 (GLOVE) ×3 IMPLANT
GLOVE BIOGEL PI IND STRL 7.5 (GLOVE) ×1 IMPLANT
GLOVE BIOGEL PI IND STRL 8.5 (GLOVE) ×1 IMPLANT
GLOVE BIOGEL PI INDICATOR 7.5 (GLOVE) ×2
GLOVE BIOGEL PI INDICATOR 8.5 (GLOVE) ×2
GOWN STRL REUS W/ TWL LRG LVL3 (GOWN DISPOSABLE) ×1 IMPLANT
GOWN STRL REUS W/TWL LRG LVL3 (GOWN DISPOSABLE) ×2
GOWN STRL REUS W/TWL XL LVL3 (GOWN DISPOSABLE) ×3 IMPLANT
HOLDER FOLEY CATH W/STRAP (MISCELLANEOUS) ×3 IMPLANT
HOVERMATT SINGLE USE (MISCELLANEOUS) ×3 IMPLANT
IV NS 1000ML (IV SOLUTION) ×2
IV NS 1000ML BAXH (IV SOLUTION) ×1 IMPLANT
KIT RM TURNOVER CYSTO AR (KITS) ×3 IMPLANT
NEEDLE SPNL 22GX3.5 QUINCKE BK (NEEDLE) IMPLANT
PACK VAGINAL MINOR WOMEN LF (CUSTOM PROCEDURE TRAY) ×3 IMPLANT
PACKING VAGINAL (PACKING) IMPLANT
PAD ABD 8X10 STRL (GAUZE/BANDAGES/DRESSINGS) ×3 IMPLANT
PAD OB MATERNITY 4.3X12.25 (PERSONAL CARE ITEMS) IMPLANT
PLUG CATH AND CAP STER (CATHETERS) IMPLANT
SET IRRIG Y TYPE TUR BLADDER L (SET/KITS/TRAYS/PACK) ×3 IMPLANT
SUT PROLENE 0 SH 30 (SUTURE) IMPLANT
SUT SILK 2 0 30  PSL (SUTURE)
SUT SILK 2 0 30 PSL (SUTURE) IMPLANT
SYR BULB IRRIGATION 50ML (SYRINGE) IMPLANT
SYR CONTROL 10ML LL (SYRINGE) IMPLANT
SYRINGE 10CC LL (SYRINGE) ×3 IMPLANT
TOWEL OR 17X24 6PK STRL BLUE (TOWEL DISPOSABLE) ×6 IMPLANT
TUBE CONNECTING 12'X1/4 (SUCTIONS)
TUBE CONNECTING 12X1/4 (SUCTIONS) IMPLANT
WATER STERILE IRR 3000ML UROMA (IV SOLUTION) IMPLANT
WATER STERILE IRR 500ML POUR (IV SOLUTION) ×3 IMPLANT
YANKAUER SUCT BULB TIP NO VENT (SUCTIONS) IMPLANT

## 2017-11-13 NOTE — Anesthesia Postprocedure Evaluation (Signed)
Anesthesia Post Note  Patient: Maureen Vaughan  Procedure(s) Performed: TANDEM RING INSERTION (N/A Vagina )     Patient location during evaluation: PACU Anesthesia Type: General Level of consciousness: awake and alert Pain management: pain level controlled Vital Signs Assessment: post-procedure vital signs reviewed and stable Respiratory status: spontaneous breathing, nonlabored ventilation and respiratory function stable Cardiovascular status: blood pressure returned to baseline and stable Postop Assessment: no apparent nausea or vomiting Anesthetic complications: no    Last Vitals:  Vitals:   11/13/17 0900 11/13/17 0913  BP: 137/67 (!) 141/60  Pulse: 83 91  Resp: 14 15  Temp:    SpO2: 96% 99%    Last Pain:  Vitals:   11/13/17 0900  TempSrc:   PainSc: [place]

## 2017-11-13 NOTE — Brief Op Note (Signed)
11/13/2017  9:31 AM  PATIENT:  Maureen Vaughan  67 y.o. female  PRE-OPERATIVE DIAGNOSIS:  ENDO CERVIX  POST-OPERATIVE DIAGNOSIS:  ENDO CERVIX  PROCEDURE:  Procedure(s): TANDEM RING INSERTION (N/A)  SURGEON:  Surgeon(s) and Role:    * Gery Pray, MD - Primary  PHYSICIAN ASSISTANT:   ASSISTANTS: none   ANESTHESIA:   general  EBL:  5cc  BLOOD ADMINISTERED:none  DRAINS: Urinary Catheter (Foley)   LOCAL MEDICATIONS USED:  NONE  SPECIMEN:  No Specimen  DISPOSITION OF SPECIMEN:  N/A  COUNTS:  YES  TOURNIQUET:  * No tourniquets in log *  DICTATION: Patient was prepped and draped in the usual sterile fashion and placed in the dorsolithotomy position. Timeout was takenwithdiscussion of length of procedure,planned procedure and preoperative medications. Patient had a Foley catheter placedwithout difficulty. The patient then had approximately 250 mL of sterile water placed in the bladder for imaging purposes.Exam under anesthesiarevealedthe cervical mass/uterus had decreased from approximately 6-7 cm down to approximately 4-4-1/2 cm. The uterus was noted to be retroverted.Therewasno obvious parametrial extension on rectovaginal examination. Patient proceeded to undergo dilation of the cervical os with ultrasound guidance. Significant hard tumor was noted along the endocervical area and into the endometrialcanal. Excellent images were obtained and the uterus was noted to be in a retroverted position.  Patient had dilation of the cervical region and a 60 mm cervical sleeve was placed. a 60 60 mm tandem wasplaced within the endocervical canal and endometrial canal. This was initially placed in the retroverted position but then turnedanteriorly andthe uterus also turned anteriorly somewhat withthis manipulation. Patient then had a HDR Ring 60 degrees with a small shielding placed at the cervical os region.Arectal paddle wasplaced posteriorly to limit dose to the rectum  region. Ultrasound imaging at the completion of the procedure showed good placement of the tandem within the midportion of the uterus, somewhat slightly anterior.Patient tolerated the procedure well. She was subsequent transferred to the recovery room in stable condition. Later in the day the patient will be taken down to radiation oncology for planning and her third high-dose-rate treatment.    PLAN OF CARE: transferred to radiation oncology for planning and treatment  PATIENT DISPOSITION:  PACU - hemodynamically stable.   Delay start of Pharmacological VTE agent (>24hrs) due to surgical blood loss or risk of bleeding: not applicable

## 2017-11-13 NOTE — Anesthesia Procedure Notes (Signed)
Procedure Name: LMA Insertion Date/Time: 11/13/2017 7:34 AM Performed by: Suan Halter, CRNA Pre-anesthesia Checklist: Patient identified, Emergency Drugs available, Suction available and Patient being monitored Patient Re-evaluated:Patient Re-evaluated prior to induction Oxygen Delivery Method: Circle system utilized Preoxygenation: Pre-oxygenation with 100% oxygen Induction Type: IV induction Ventilation: Mask ventilation without difficulty LMA: LMA inserted LMA Size: 4.0 Number of attempts: 1 Airway Equipment and Method: Bite block Placement Confirmation: positive ETCO2 Tube secured with: Tape Dental Injury: Teeth and Oropharynx as per pre-operative assessment

## 2017-11-13 NOTE — Progress Notes (Signed)
IMMEDIATELY FOLLOWING SURGERY: Do not drive or operate machinery for the first twenty four hours after surgery. Do not make any important decisions for twenty four hours after surgery or while taking narcotic pain medications or sedatives. If you develop intractable nausea and vomiting or a severe headache please notify your doctor immediately.   FOLLOW-UP: You do not need to follow up with anesthesia unless specifically instructed to do so.   WOUND CARE INSTRUCTIONS (if applicable): Expect some mild vaginal bleeding, but if large amount of bleeding occurs please contact Dr. Sondra Come at [phone] or the Radiation On-Call physician. Call for any fever greater than 101.0 degrees or increasing vaginal//abdominal pain or trouble urinating.   QUESTIONS?: Please feel free to call your physician or the hospital operator if you have any questions, and they will be happy to assist you. Resume all medications: as listed on your after visit summary. Your next appointment is:  Future Appointments  Date Time Provider [place]  11/15/2017  7:30 AM WL-US 1 WL-US [place]  11/15/2017  9:00 AM Gery Pray, MD [facility] None  11/15/2017  1:30 PM Gery Pray, MD [facility] None  11/16/2017  8:00 AM [facility] LAB 2 [facility] None  11/20/2017  9:00 AM WL-US 1 WL-US [place]  11/20/2017 11:00 AM Gery Pray, MD [place] None  11/20/2017  3:00 PM Gery Pray, MD [facility] None  11/23/2017  8:00 AM [facility] LAB 6 [facility] None  11/27/2017 11:00 AM Gery Pray, MD [facility] None  11/27/2017  3:00 PM Gery Pray, MD [facility] None  12/26/2017  8:00 AM [facility] LAB 6 [facility] None  12/26/2017  8:15 AM [facility] FLUSH NURSE [facility] None  12/27/2017 11:30 AM Heath Lark, MD [facility] None

## 2017-11-13 NOTE — Progress Notes (Signed)
  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 Identity was confirmed. All relevant records and images related to the planned course of therapy were reviewed. The patient freely provided informed written consent to proceed with treatment after reviewing the details related to the planned course of therapy. The consent form was witnessed and verified by the simulation staff.  Then, the patient was set-up in a stable reproducible  supine position for radiation therapy. CT images were obtained. Surface markings were placed. The CT images were loaded into the planning software.  Then the target and avoidance structures were contoured. Treatment planning then occurred.  The radiation prescription was entered and confirmed.   I have requested : Brachytherapy Isodose Plan and Dosimetry Calculations to plan the radiation distribution.    PLAN:  The patient will receive 5.5 Gy in 1 fraction.  Patient will be treated with the tandem ring system using 2 channels for HDR treatment. Iridium 192 will be the high-dose-rate source. The patient will be treated with a 60 mm 60 tandem/ring system.     ________________________________  Blair Promise, PhD, MD

## 2017-11-13 NOTE — Progress Notes (Signed)
  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 Identity was confirmed. All relevant records and images related to the planned course of therapy were reviewed. The patient freely provided informed written consent to proceed with treatment after reviewing the details related to the planned course of therapy. The consent form was witnessed and verified by the simulation staff. Then, the patient was set-up in a stable reproducible supine position for radiation therapy. The tandem ring system was accessed and fiducial markers were placed within the tandem and ring.   Simple treatment device note: On the operating room the patient had construction of her custom tandem ring system. She will be treated with a 60 tandem/ring system. The patient had placement of a 60 mm tandem. A cervical ring with a small shielding was used for her treatment. A rectal paddle was also part of her custom set up device.  Verification simulation note: An AP and lateral film was obtained through the pelvis area. This was compared to the patient's planning films documenting accurate position of the tandem/ring system for treatment.  High-dose-rate brachytherapy treatment note:  The remote afterloading device was accessed through catheter system and attached to the tandem ring system. Patient then proceeded to undergo her third high-dose-rate treatment directed at the cervix. The patient was prescribed a dose of 6.0 gray to be delivered to the high-risk clinical target volume.. Patient was treated with 2 channels using 23 dwell positions. Treatment time was 470.7 seconds. The patient  tolerated the procedure well. After completion of her therapy, a radiation survey was performed documenting return of the iridium source into the GammaMed safe. The patient was then transferred to the nursing suite. She then had removal of the rectal paddle followed by the tandem and ring system. The patient tolerated the removal well.  PLAN: patient will return later this week for her fourth high-dose rate treatment. ________________________________  Blair Promise, PhD, MD

## 2017-11-13 NOTE — Progress Notes (Signed)
Foley catheter removed intact.  Right hand IV removed intact.  Pressure and bandaid applied.  Assisted patient with bathing and dressing.  She did vomit after sitting up.   She has a scopolamine patch on and said she wanted to go home.  Patient was given discharge instructions and paperwork and was brought to the lobby in a wheelchair to meet her husband.

## 2017-11-13 NOTE — Anesthesia Preprocedure Evaluation (Signed)
Anesthesia Evaluation  Patient identified by MRN, date of birth, ID band Patient awake    Reviewed: Allergy & Precautions, NPO status , Patient's Chart, lab work & pertinent test results  Airway Mallampati: II  TM Distance: >3 FB Neck ROM: Full    Dental  (+) Teeth Intact, Dental Advisory Given   Pulmonary neg pulmonary ROS,    breath sounds clear to auscultation       Cardiovascular negative cardio ROS   Rhythm:Regular Rate:Normal     Neuro/Psych negative neurological ROS  negative psych ROS   GI/Hepatic negative GI ROS, Neg liver ROS,   Endo/Other  negative endocrine ROS  Renal/GU negative Renal ROS     Musculoskeletal negative musculoskeletal ROS (+)   Abdominal Normal abdominal exam  (+)   Peds  Hematology negative hematology ROS (+)   Anesthesia Other Findings   Reproductive/Obstetrics                             Lab Results  Component Value Date   WBC 5.6 11/09/2017   HGB 10.3 (L) 11/09/2017   HCT 30.6 (L) 11/09/2017   MCV 88.7 11/09/2017   PLT 170 11/09/2017   Lab Results  Component Value Date   CREATININE 1.02 11/09/2017   BUN 12 11/09/2017   NA 142 11/09/2017   K 3.9 11/09/2017   CL 104 11/09/2017   CO2 28 11/09/2017   Lab Results  Component Value Date   INR 1.02 09/10/2017     Anesthesia Physical  Anesthesia Plan  ASA: II  Anesthesia Plan: General   Post-op Pain Management:    Induction: Intravenous  PONV Risk Score and Plan: 3 and Ondansetron, Dexamethasone and Midazolam  Airway Management Planned: LMA  Additional Equipment: None  Intra-op Plan:   Post-operative Plan: Extubation in OR  Informed Consent: I have reviewed the patients History and Physical, chart, labs and discussed the procedure including the risks, benefits and alternatives for the proposed anesthesia with the patient or authorized representative who has indicated his/her  understanding and acceptance.   Dental advisory given  Plan Discussed with: CRNA  Anesthesia Plan Comments:         Anesthesia Quick Evaluation

## 2017-11-13 NOTE — Transfer of Care (Signed)
Immediate Anesthesia Transfer of Care Note  Patient: Maureen Vaughan  Procedure(s) Performed: Procedure(s) (LRB): TANDEM RING INSERTION (N/A)  Patient Location: PACU  Anesthesia Type: General  Level of Consciousness: awake, oriented, sedated and patient cooperative  Airway & Oxygen Therapy: Patient Spontanous Breathing and Patient connected to face mask oxygen  Post-op Assessment: Report given to PACU RN and Post -op Vital signs reviewed and stable  Post vital signs: Reviewed and stable  Complications: No apparent anesthesia complications Last Vitals:  Vitals:   11/13/17 0533 11/13/17 0820  BP: 133/69 (P) 131/63  Pulse: (!) 101 (P) 92  Resp: 16 (P) 12  Temp: 36.9 C (P) 36.4 C  SpO2: 98% (P) 100%    Last Pain:  Vitals:   11/13/17 0533  TempSrc: Oral      Patients Stated Pain Goal: 6 (44/01/02 7253)  Complications: No anesthesia complications

## 2017-11-13 NOTE — Interval H&P Note (Signed)
History and Physical Interval Note:  11/13/2017 7:36 AM  Maureen Vaughan  has presented today for surgery, with the diagnosis of ENDO CERVIX  The various methods of treatment have been discussed with the patient and family. After consideration of risks, benefits and other options for treatment, the patient has consented to  Procedure(s): TANDEM RING INSERTION (N/A) as a surgical intervention .  The patient's history has been reviewed, patient examined, no change in status, stable for surgery.  I have reviewed the patient's chart and labs.  Questions were answered to the patient's satisfaction.     Gery Pray

## 2017-11-14 ENCOUNTER — Encounter (HOSPITAL_BASED_OUTPATIENT_CLINIC_OR_DEPARTMENT_OTHER): Payer: Self-pay | Admitting: Radiation Oncology

## 2017-11-14 ENCOUNTER — Telehealth: Payer: Self-pay | Admitting: Oncology

## 2017-11-14 NOTE — Telephone Encounter (Signed)
Patient called and said her cervical sleeve fell out this morning.

## 2017-11-14 NOTE — Anesthesia Preprocedure Evaluation (Addendum)
Anesthesia Evaluation  Patient identified by MRN, date of birth, ID band Patient awake  General Assessment Comment:Cervical adenocarcinoma [facility]) primary oncologist-  dr gorsuch/  oncologist-  dr Sondra Come dx 07-04-2017  Stage IIB w/ lMETs to eft external iliac adenopathy--- completed adjunct chemo and external radiation (09-12-2017 to 10-19-2017) and to start direct high dose radiation 11-06-2017 x5 treatments Leukopenia due to antineoplastic chemotherapy    Reviewed: Allergy & Precautions, NPO status , Patient's Chart, lab work & pertinent test results  History of Anesthesia Complications (+) PONV  Airway Mallampati: II  TM Distance: >3 FB Neck ROM: Full    Dental no notable dental hx. (+) Teeth Intact, Dental Advisory Given   Pulmonary neg pulmonary ROS,    Pulmonary exam normal breath sounds clear to auscultation       Cardiovascular negative cardio ROS Normal cardiovascular exam Rhythm:Regular Rate:Normal     Neuro/Psych negative neurological ROS  negative psych ROS   GI/Hepatic negative GI ROS, Neg liver ROS,   Endo/Other  negative endocrine ROS  Renal/GU negative Renal ROS  negative genitourinary   Musculoskeletal negative musculoskeletal ROS (+)   Abdominal   Peds negative pediatric ROS (+)  Hematology negative hematology ROS (+)   Anesthesia Other Findings   Reproductive/Obstetrics negative OB ROS                            Anesthesia Physical Anesthesia Plan  ASA: III  Anesthesia Plan: General   Post-op Pain Management:    Induction: Intravenous  PONV Risk Score and Plan: 3 and Ondansetron, Dexamethasone and Treatment may vary due to age or medical condition  Airway Management Planned: LMA  Additional Equipment:   Intra-op Plan:   Post-operative Plan: Extubation in OR  Informed Consent: I have reviewed the patients History and Physical, chart, labs and discussed the  procedure including the risks, benefits and alternatives for the proposed anesthesia with the patient or authorized representative who has indicated his/her understanding and acceptance.   Dental advisory given  Plan Discussed with: CRNA, Surgeon and Anesthesiologist  Anesthesia Plan Comments:        Anesthesia Quick Evaluation

## 2017-11-15 ENCOUNTER — Encounter (HOSPITAL_BASED_OUTPATIENT_CLINIC_OR_DEPARTMENT_OTHER): Payer: Self-pay | Admitting: *Deleted

## 2017-11-15 ENCOUNTER — Ambulatory Visit (HOSPITAL_BASED_OUTPATIENT_CLINIC_OR_DEPARTMENT_OTHER): Payer: Medicare HMO | Admitting: Anesthesiology

## 2017-11-15 ENCOUNTER — Encounter (HOSPITAL_BASED_OUTPATIENT_CLINIC_OR_DEPARTMENT_OTHER)
Admission: RE | Disposition: A | Discharge status: Home / Self Care | Payer: Self-pay | Source: Ambulatory Visit | Attending: Radiation Oncology

## 2017-11-15 ENCOUNTER — Ambulatory Visit (HOSPITAL_COMMUNITY)
Admission: RE | Admit: 2017-11-15 | Discharge: 2017-11-15 | Disposition: A | Discharge status: Home / Self Care | Payer: Medicare HMO | Source: Ambulatory Visit | Attending: Radiation Oncology | Admitting: Radiation Oncology

## 2017-11-15 ENCOUNTER — Ambulatory Visit (HOSPITAL_BASED_OUTPATIENT_CLINIC_OR_DEPARTMENT_OTHER)
Admission: RE | Admit: 2017-11-15 | Discharge: 2017-11-15 | Disposition: A | Discharge status: Home / Self Care | Payer: Medicare HMO | Source: Ambulatory Visit | Attending: Radiation Oncology | Admitting: Radiation Oncology

## 2017-11-15 ENCOUNTER — Ambulatory Visit
Admission: RE | Admit: 2017-11-15 | Discharge: 2017-11-15 | Disposition: A | Discharge status: Home / Self Care | Payer: Medicare HMO | Source: Ambulatory Visit | Attending: Radiation Oncology | Admitting: Radiation Oncology

## 2017-11-15 ENCOUNTER — Other Ambulatory Visit: Payer: Self-pay

## 2017-11-15 VITALS — BP 124/73 | HR 73

## 2017-11-15 DIAGNOSIS — C530 Malignant neoplasm of endocervix: Secondary | ICD-10-CM

## 2017-11-15 DIAGNOSIS — Z8249 Family history of ischemic heart disease and other diseases of the circulatory system: Secondary | ICD-10-CM | POA: Diagnosis not present

## 2017-11-15 DIAGNOSIS — Z86010 Personal history of colonic polyps: Secondary | ICD-10-CM | POA: Diagnosis not present

## 2017-11-15 DIAGNOSIS — R918 Other nonspecific abnormal finding of lung field: Secondary | ICD-10-CM | POA: Diagnosis not present

## 2017-11-15 DIAGNOSIS — Z9889 Other specified postprocedural states: Secondary | ICD-10-CM | POA: Diagnosis not present

## 2017-11-15 DIAGNOSIS — Z9221 Personal history of antineoplastic chemotherapy: Secondary | ICD-10-CM | POA: Diagnosis not present

## 2017-11-15 DIAGNOSIS — Z79899 Other long term (current) drug therapy: Secondary | ICD-10-CM | POA: Diagnosis not present

## 2017-11-15 DIAGNOSIS — C539 Malignant neoplasm of cervix uteri, unspecified: Secondary | ICD-10-CM

## 2017-11-15 DIAGNOSIS — C775 Secondary and unspecified malignant neoplasm of intrapelvic lymph nodes: Secondary | ICD-10-CM | POA: Insufficient documentation

## 2017-11-15 DIAGNOSIS — Z923 Personal history of irradiation: Secondary | ICD-10-CM | POA: Diagnosis not present

## 2017-11-15 DIAGNOSIS — Z818 Family history of other mental and behavioral disorders: Secondary | ICD-10-CM | POA: Diagnosis not present

## 2017-11-15 DIAGNOSIS — Z91048 Other nonmedicinal substance allergy status: Secondary | ICD-10-CM | POA: Diagnosis not present

## 2017-11-15 DIAGNOSIS — E041 Nontoxic single thyroid nodule: Secondary | ICD-10-CM | POA: Diagnosis not present

## 2017-11-15 DIAGNOSIS — Z800 Family history of malignant neoplasm of digestive organs: Secondary | ICD-10-CM | POA: Insufficient documentation

## 2017-11-15 HISTORY — PX: TANDEM RING INSERTION: SHX6199

## 2017-11-15 SURGERY — INSERTION, UTERINE TANDEM AND RING OR CYLINDER, FOR BRACHYTHERAPY
Anesthesia: General | Site: Cervix

## 2017-11-15 MED ORDER — HYDROMORPHONE HCL 1 MG/ML IJ SOLN
0.5000 mg | Freq: Once | INTRAMUSCULAR | Status: AC
  Administered 2017-11-15: 0.5 mg via INTRAVENOUS
  Filled 2017-11-15: qty 1

## 2017-11-15 MED ORDER — ACETAMINOPHEN 10 MG/ML IV SOLN
INTRAVENOUS | Status: DC | PRN
  Administered 2017-11-15: 1000 mg via INTRAVENOUS

## 2017-11-15 MED ORDER — ONDANSETRON HCL 4 MG/2ML IJ SOLN
INTRAMUSCULAR | Status: AC
  Filled 2017-11-15: qty 2

## 2017-11-15 MED ORDER — DEXAMETHASONE SODIUM PHOSPHATE 10 MG/ML IJ SOLN
INTRAMUSCULAR | Status: AC
  Filled 2017-11-15: qty 1

## 2017-11-15 MED ORDER — SCOPOLAMINE 1 MG/3DAYS TD PT72
MEDICATED_PATCH | TRANSDERMAL | Status: AC
  Filled 2017-11-15: qty 1

## 2017-11-15 MED ORDER — HYDROMORPHONE HCL 1 MG/ML IJ SOLN
INTRAMUSCULAR | Status: AC
  Filled 2017-11-15: qty 1

## 2017-11-15 MED ORDER — FENTANYL CITRATE (PF) 100 MCG/2ML IJ SOLN
INTRAMUSCULAR | Status: AC
  Filled 2017-11-15: qty 2

## 2017-11-15 MED ORDER — LIDOCAINE 2% (20 MG/ML) 5 ML SYRINGE
INTRAMUSCULAR | Status: AC
  Filled 2017-11-15: qty 5

## 2017-11-15 MED ORDER — DEXAMETHASONE SODIUM PHOSPHATE 10 MG/ML IJ SOLN
INTRAMUSCULAR | Status: DC | PRN
  Administered 2017-11-15: 10 mg via INTRAVENOUS

## 2017-11-15 MED ORDER — PROPOFOL 10 MG/ML IV BOLUS
INTRAVENOUS | Status: AC
  Filled 2017-11-15: qty 40

## 2017-11-15 MED ORDER — LIDOCAINE 2% (20 MG/ML) 5 ML SYRINGE
INTRAMUSCULAR | Status: DC | PRN
  Administered 2017-11-15: 70 mg via INTRAVENOUS

## 2017-11-15 MED ORDER — FUROSEMIDE 10 MG/ML IJ SOLN
INTRAMUSCULAR | Status: AC
  Filled 2017-11-15: qty 2

## 2017-11-15 MED ORDER — MIDAZOLAM HCL 2 MG/2ML IJ SOLN
INTRAMUSCULAR | Status: DC | PRN
  Administered 2017-11-15: 2 mg via INTRAVENOUS

## 2017-11-15 MED ORDER — ONDANSETRON HCL 4 MG/2ML IJ SOLN
INTRAMUSCULAR | Status: DC | PRN
  Administered 2017-11-15: 4 mg via INTRAVENOUS

## 2017-11-15 MED ORDER — PROMETHAZINE HCL 25 MG/ML IJ SOLN
6.2500 mg | INTRAMUSCULAR | Status: DC | PRN
  Filled 2017-11-15: qty 1

## 2017-11-15 MED ORDER — MIDAZOLAM HCL 2 MG/2ML IJ SOLN
INTRAMUSCULAR | Status: AC
  Filled 2017-11-15: qty 2

## 2017-11-15 MED ORDER — KETOROLAC TROMETHAMINE 30 MG/ML IJ SOLN
30.0000 mg | Freq: Once | INTRAMUSCULAR | Status: DC | PRN
  Filled 2017-11-15: qty 1

## 2017-11-15 MED ORDER — LACTATED RINGERS IV SOLN
INTRAVENOUS | Status: DC
  Administered 2017-11-15: 11:00:00 via INTRAVENOUS
  Filled 2017-11-15 (×2): qty 250

## 2017-11-15 MED ORDER — KETOROLAC TROMETHAMINE 30 MG/ML IJ SOLN
INTRAMUSCULAR | Status: AC
  Filled 2017-11-15: qty 1

## 2017-11-15 MED ORDER — PROPOFOL 10 MG/ML IV BOLUS
INTRAVENOUS | Status: DC | PRN
  Administered 2017-11-15: 200 mg via INTRAVENOUS

## 2017-11-15 MED ORDER — ARTIFICIAL TEARS OPHTHALMIC OINT
TOPICAL_OINTMENT | OPHTHALMIC | Status: AC
  Filled 2017-11-15: qty 3.5

## 2017-11-15 MED ORDER — OXYCODONE HCL 5 MG PO TABS
5.0000 mg | ORAL_TABLET | Freq: Once | ORAL | Status: DC | PRN
  Filled 2017-11-15: qty 1

## 2017-11-15 MED ORDER — SCOPOLAMINE 1 MG/3DAYS TD PT72
MEDICATED_PATCH | TRANSDERMAL | Status: DC | PRN
  Administered 2017-11-15: 1 via TRANSDERMAL

## 2017-11-15 MED ORDER — SODIUM CHLORIDE 0.9 % IR SOLN
Status: DC | PRN
  Administered 2017-11-15: 1 via INTRAVESICAL

## 2017-11-15 MED ORDER — PHENYLEPHRINE 40 MCG/ML (10ML) SYRINGE FOR IV PUSH (FOR BLOOD PRESSURE SUPPORT)
PREFILLED_SYRINGE | INTRAVENOUS | Status: AC
  Filled 2017-11-15: qty 10

## 2017-11-15 MED ORDER — LACTATED RINGERS IV SOLN
INTRAVENOUS | Status: DC
  Administered 2017-11-15 (×2): via INTRAVENOUS
  Filled 2017-11-15 (×2): qty 1000

## 2017-11-15 MED ORDER — KETOROLAC TROMETHAMINE 30 MG/ML IJ SOLN
INTRAMUSCULAR | Status: DC | PRN
  Administered 2017-11-15: 30 mg via INTRAVENOUS

## 2017-11-15 MED ORDER — ACETAMINOPHEN 10 MG/ML IV SOLN
INTRAVENOUS | Status: AC
  Filled 2017-11-15: qty 100

## 2017-11-15 MED ORDER — OXYCODONE HCL 5 MG/5ML PO SOLN
5.0000 mg | Freq: Once | ORAL | Status: DC | PRN
  Filled 2017-11-15: qty 5

## 2017-11-15 MED ORDER — FENTANYL CITRATE (PF) 100 MCG/2ML IJ SOLN
25.0000 ug | INTRAMUSCULAR | Status: DC | PRN
  Administered 2017-11-15 (×3): 50 ug via INTRAVENOUS
  Filled 2017-11-15: qty 1

## 2017-11-15 SURGICAL SUPPLY — 30 items
BAG URINE DRAINAGE (UROLOGICAL SUPPLIES) ×3 IMPLANT
BNDG CONFORM 2 STRL LF (GAUZE/BANDAGES/DRESSINGS) IMPLANT
CATH FOLEY 2WAY SLVR  5CC 16FR (CATHETERS) ×2
CATH FOLEY 2WAY SLVR 5CC 16FR (CATHETERS) ×1 IMPLANT
DILATOR CANAL MILEX (MISCELLANEOUS) ×3 IMPLANT
GLOVE BIO SURGEON STRL SZ7.5 (GLOVE) ×6 IMPLANT
GOWN STRL REUS W/ TWL LRG LVL3 (GOWN DISPOSABLE) ×2 IMPLANT
GOWN STRL REUS W/TWL LRG LVL3 (GOWN DISPOSABLE) ×4
HOLDER FOLEY CATH W/STRAP (MISCELLANEOUS) ×3 IMPLANT
HOVERMATT SINGLE USE (MISCELLANEOUS) ×3 IMPLANT
KIT RM TURNOVER CYSTO AR (KITS) ×3 IMPLANT
NEEDLE SPNL 22GX3.5 QUINCKE BK (NEEDLE) IMPLANT
PACK VAGINAL MINOR WOMEN LF (CUSTOM PROCEDURE TRAY) ×3 IMPLANT
PACKING VAGINAL (PACKING) IMPLANT
PAD ABD 8X10 STRL (GAUZE/BANDAGES/DRESSINGS) ×3 IMPLANT
PAD OB MATERNITY 4.3X12.25 (PERSONAL CARE ITEMS) IMPLANT
PLUG CATH AND CAP STER (CATHETERS) IMPLANT
SET IRRIG Y TYPE TUR BLADDER L (SET/KITS/TRAYS/PACK) ×3 IMPLANT
SUT PROLENE 0 SH 30 (SUTURE) IMPLANT
SUT SILK 2 0 30  PSL (SUTURE)
SUT SILK 2 0 30 PSL (SUTURE) IMPLANT
SYR BULB IRRIGATION 50ML (SYRINGE) IMPLANT
SYR CONTROL 10ML LL (SYRINGE) ×3 IMPLANT
SYRINGE 10CC LL (SYRINGE) ×3 IMPLANT
TOWEL OR 17X24 6PK STRL BLUE (TOWEL DISPOSABLE) ×6 IMPLANT
TUBE CONNECTING 12'X1/4 (SUCTIONS)
TUBE CONNECTING 12X1/4 (SUCTIONS) IMPLANT
WATER STERILE IRR 3000ML UROMA (IV SOLUTION) ×3 IMPLANT
WATER STERILE IRR 500ML POUR (IV SOLUTION) ×3 IMPLANT
YANKAUER SUCT BULB TIP NO VENT (SUCTIONS) IMPLANT

## 2017-11-15 NOTE — Transfer of Care (Signed)
Immediate Anesthesia Transfer of Care Note  Patient: Maureen Vaughan  Procedure(s) Performed: TANDEM RING INSERTION (N/A Cervix)  Patient Location: PACU  Anesthesia Type:General  Level of Consciousness: awake, alert , oriented and patient cooperative  Airway & Oxygen Therapy: Patient Spontanous Breathing and Patient connected to nasal cannula oxygen  Post-op Assessment: Report given to RN and Post -op Vital signs reviewed and stable  Post vital signs: Reviewed and stable  Last Vitals:  Vitals:   11/15/17 0533  BP: 118/64  Pulse: 85  Resp: 16  Temp: 36.9 C  SpO2: 100%    Last Pain:  Vitals:   11/15/17 0533  TempSrc: Oral      Patients Stated Pain Goal: 5 (61/68/37 2902)  Complications: No apparent anesthesia complications

## 2017-11-15 NOTE — Interval H&P Note (Signed)
History and Physical Interval Note:  11/15/2017 7:39 AM  Maureen Vaughan  has presented today for surgery, with the diagnosis of ENDO CERVIX  The various methods of treatment have been discussed with the patient and family. After consideration of risks, benefits and other options for treatment, the patient has consented to  Procedure(s): TANDEM RING INSERTION (N/A) as a surgical intervention .  The patient's history has been reviewed, patient examined, no change in status, stable for surgery.  I have reviewed the patient's chart and labs.  Questions were answered to the patient's satisfaction.     Gery Pray

## 2017-11-15 NOTE — Progress Notes (Signed)
IMMEDIATELY FOLLOWING SURGERY: Do not drive or operate machinery for the first twenty four hours after surgery. Do not make any important decisions for twenty four hours after surgery or while taking narcotic pain medications or sedatives. If you develop intractable nausea and vomiting or a severe headache please notify your doctor immediately.   FOLLOW-UP: You do not need to follow up with anesthesia unless specifically instructed to do so.   WOUND CARE INSTRUCTIONS (if applicable): Expect some mild vaginal bleeding, but if large amount of bleeding occurs please contact Dr. Sondra Come at [phone] or the Radiation On-Call physician. Call for any fever greater than 101.0 degrees or increasing vaginal//abdominal pain or trouble urinating.   QUESTIONS?: Please feel free to call your physician or the hospital operator if you have any questions, and they will be happy to assist you. Resume all medications: as listed on your after visit summary. Your next appointment is:  Future Appointments  Date Time Provider [place]  11/16/2017  8:00 AM [facility] LAB 2 [facility] None  11/20/2017  9:00 AM WL-US 1 WL-US [place]  11/20/2017 11:00 AM Gery Pray, MD [facility] None  11/20/2017  3:00 PM Gery Pray, MD [facility] None  11/23/2017  8:00 AM [facility] LAB 6 [facility] None  11/27/2017 11:00 AM Gery Pray, MD [facility] None  11/27/2017  3:00 PM Gery Pray, MD [facility] None  12/26/2017  8:00 AM [facility] LAB 6 [facility] None  12/26/2017  8:15 AM [facility] FLUSH NURSE [facility] None  12/27/2017 11:30 AM Heath Lark, MD [facility] None

## 2017-11-15 NOTE — Anesthesia Postprocedure Evaluation (Signed)
Anesthesia Post Note  Patient: Tymeka Privette  Procedure(s) Performed: TANDEM RING INSERTION (N/A Cervix)     Patient location during evaluation: PACU Anesthesia Type: General Level of consciousness: awake and alert Pain management: pain level controlled Vital Signs Assessment: post-procedure vital signs reviewed and stable Respiratory status: spontaneous breathing, nonlabored ventilation, respiratory function stable and patient connected to nasal cannula oxygen Cardiovascular status: blood pressure returned to baseline and stable Postop Assessment: no apparent nausea or vomiting Anesthetic complications: no    Last Vitals:  Vitals:   11/15/17 0830 11/15/17 0845  BP: (!) 133/58 134/64  Pulse: 73 74  Resp: 18 19  Temp:    SpO2: 100% 100%    Last Pain:  Vitals:   11/15/17 0845  TempSrc:   PainSc: 7                  Yalexa Blust S

## 2017-11-15 NOTE — Progress Notes (Signed)
  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 Identity was confirmed. All relevant records and images related to the planned course of therapy were reviewed. The patient freely provided informed written consent to proceed with treatment after reviewing the details related to the planned course of therapy. The consent form was witnessed and verified by the simulation staff.  Then, the patient was set-up in a stable reproducible  supine position for radiation therapy. CT images were obtained. Surface markings were placed. The CT images were loaded into the planning software.  Then the target and avoidance structures were contoured. Treatment planning then occurred.  The radiation prescription was entered and confirmed.   I have requested : Brachytherapy Isodose Plan and Dosimetry Calculations to plan the radiation distribution.    PLAN:  The patient will receive 5.5 Gy in 1 fraction.  Patient will be treated with the tandem ring system using 2 channels for HDR treatment. Iridium 192 will be the high-dose-rate source. The patient will be treated with a 60 mm 60 tandem/ring system.     ________________________________  Blair Promise, PhD, MD  This document serves as a record of services personally performed by Gery Pray, MD. It was created on his behalf by Bethann Humble, a trained medical scribe. The creation of this record is based on the scribe's personal observations and the provider's statements to them. This document has been checked and approved by the attending provider.

## 2017-11-15 NOTE — Progress Notes (Signed)
Foley catheter removed intact.  Left hand IV removed intact.  Pressure and band-aid applied.  Patient was given discharge instructions and paperwork.  She was discharged from the clinic in a wheelchair with her husband.

## 2017-11-15 NOTE — Progress Notes (Signed)
  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 Identity was confirmed. All relevant records and images related to the planned course of therapy were reviewed. The patient freely provided informed written consent to proceed with treatment after reviewing the details related to the planned course of therapy. The consent form was witnessed and verified by the simulation staff. Then, the patient was set-up in a stable reproducible supine position for radiation therapy. The tandem ring system was accessed and fiducial markers were placed within the tandem and ring.   Simple treatment device note: On the operating room the patient had construction of her custom tandem ring system. She will be treated with a 60 tandem/ring system. The patient had placement of a 60 mm tandem. A cervical ring with a small shielding was used for her treatment. A rectal paddle was also part of her custom set up device.  Verification simulation note: An AP and lateral film was obtained through the pelvis area. This was compared to the patient's planning films documenting accurate position of the tandem/ring system for treatment.  High-dose-rate brachytherapy treatment note:  The remote afterloading device was accessed through catheter system and attached to the tandem ring system. Patient then proceeded to undergo her fourth high-dose-rate treatment directed at the cervix. The patient was prescribed a dose of 6.0 gray to be delivered to the high-risk clinical target volume.. Patient was treated with 2 channels using 23 dwell positions. Treatment time was 460.9 seconds. The patient  tolerated the procedure well. After completion of her therapy, a radiation survey was performed documenting return of the iridium source into the GammaMed safe. The patient was then transferred to the nursing suite. She then had removal of the rectal paddle followed by the tandem and ring system. The patient tolerated the removal well.  PLAN: patient will return next week for her fifth high-dose rate treatment. ________________________________  Blair Promise, PhD, MD  This document serves as a record of services personally performed by Gery Pray, MD. It was created on his behalf by Bethann Humble, a trained medical scribe. The creation of this record is based on the scribe's personal observations and the provider's statements to them. This document has been checked and approved by the attending provider.

## 2017-11-15 NOTE — Anesthesia Procedure Notes (Signed)
Procedure Name: LMA Insertion Date/Time: 11/15/2017 7:37 AM Performed by: Wanita Chamberlain, CRNA Pre-anesthesia Checklist: Patient identified, Emergency Drugs available, Suction available and Patient being monitored Patient Re-evaluated:Patient Re-evaluated prior to induction Oxygen Delivery Method: Circle system utilized Preoxygenation: Pre-oxygenation with 100% oxygen Induction Type: IV induction Ventilation: Mask ventilation without difficulty LMA: LMA inserted LMA Size: 4.0 Number of attempts: 1 Placement Confirmation: breath sounds checked- equal and bilateral,  CO2 detector and positive ETCO2 Dental Injury: Teeth and Oropharynx as per pre-operative assessment

## 2017-11-15 NOTE — Brief Op Note (Signed)
11/15/2017  8:48 AM  PATIENT:  Maureen Vaughan  67 y.o. female  PRE-OPERATIVE DIAGNOSIS:  ENDO CERVIX  POST-OPERATIVE DIAGNOSIS:  ENDO CERVIX  PROCEDURE:  Procedure(s): TANDEM RING INSERTION (N/A)  SURGEON:  Surgeon(s) and Role:    * Gery Pray, MD - Primary  PHYSICIAN ASSISTANT:   ASSISTANTS: none   ANESTHESIA:   general  EBL:  5 mL   BLOOD ADMINISTERED:none  DRAINS: Urinary Catheter (Foley)   LOCAL MEDICATIONS USED:  NONE  SPECIMEN:  No Specimen  DISPOSITION OF SPECIMEN:  N/A  COUNTS:  YES  TOURNIQUET:  * No tourniquets in log *  DICTATION: Timeout was performed for the procedure preoperative medications, allergies.  Patient was prepped and draped in usual sterile fashion and placed in the dorsal lithotomy position.  A Foley catheter was inserted without difficulty.  The bladder was then backfilled with approximately 250 cc of sterile water for imaging  purposes with transabdominal ultrasound.  Patient proceeded to undergo dilation of the cervical os with ultrasound guidance. Significant hard tumor was noted along the endocervical area and into the endometrialcanal. Excellent images were obtained and the uterus was noted to be in a retroverted position.  Patient had dilation of the cervical region and a60 mm cervical sleeve was placed. a 6060 mm tandemwasplaced within the endocervical canal and endometrial canal. This was initially placed in the retroverted position but then turnedanteriorly andthe uterus also turned anteriorly  withthis manipulation. Patient then had a HDR Ring60degreeswith a small shielding placed at the cervical os region.Arectal paddle wasplaced posteriorly to limit dose to the rectum region. Ultrasound imaging at the completion of the procedure showed good placement of the tandem within the center slightly anterior position of the uterus, .Patient tolerated the procedure well. She was subsequent transferred to the recovery room in  stable condition. Later in the day the patient will be taken down to radiation oncology for planning and herfourthhigh-dose-rate treatment.    PLAN OF CARE: Transferred to radiation oncology for planning and treatment  PATIENT DISPOSITION:  PACU - hemodynamically stable.   Delay start of Pharmacological VTE agent (>24hrs) due to surgical blood loss or risk of bleeding: not applicable

## 2017-11-15 NOTE — Addendum Note (Signed)
Encounter addended by: Jacqulyn Liner, RN on: 11/15/2017 4:22 PM  Actions taken: LDA properties accepted, Flowsheet accepted, Sign clinical note

## 2017-11-16 ENCOUNTER — Encounter (HOSPITAL_BASED_OUTPATIENT_CLINIC_OR_DEPARTMENT_OTHER): Payer: Self-pay | Admitting: Radiation Oncology

## 2017-11-16 ENCOUNTER — Inpatient Hospital Stay: Payer: Medicare HMO | Attending: Hematology and Oncology

## 2017-11-16 ENCOUNTER — Telehealth: Payer: Self-pay

## 2017-11-16 DIAGNOSIS — Z79899 Other long term (current) drug therapy: Secondary | ICD-10-CM | POA: Insufficient documentation

## 2017-11-16 DIAGNOSIS — C539 Malignant neoplasm of cervix uteri, unspecified: Secondary | ICD-10-CM | POA: Insufficient documentation

## 2017-11-16 DIAGNOSIS — R918 Other nonspecific abnormal finding of lung field: Secondary | ICD-10-CM

## 2017-11-16 LAB — COMPREHENSIVE METABOLIC PANEL
ALK PHOS: 62 U/L (ref 40–150)
ALT: 9 U/L (ref 0–55)
AST: 13 U/L (ref 5–34)
Albumin: 3.6 g/dL (ref 3.5–5.0)
Anion gap: 7 (ref 3–11)
BILIRUBIN TOTAL: 0.4 mg/dL (ref 0.2–1.2)
BUN: 15 mg/dL (ref 7–26)
CO2: 29 mmol/L (ref 22–29)
Calcium: 9.5 mg/dL (ref 8.4–10.4)
Chloride: 106 mmol/L (ref 98–109)
Creatinine, Ser: 1.03 mg/dL (ref 0.60–1.10)
GFR calc Af Amer: >60 mL/min (ref >60)
GFR, EST NON AFRICAN AMERICAN: 55 mL/min — AB (ref >60)
Glucose, Bld: 95 mg/dL (ref 70–140)
Potassium: 4.1 mmol/L (ref 3.5–5.1)
Sodium: 142 mmol/L (ref 136–145)
TOTAL PROTEIN: 6.6 g/dL (ref 6.4–8.3)

## 2017-11-16 LAB — CBC WITH DIFFERENTIAL/PLATELET
BASOS ABS: 0 10*3/uL (ref 0.0–0.1)
Basophils Relative: 0 %
EOS ABS: 0 10*3/uL (ref 0.0–0.5)
EOS PCT: 0 %
HCT: 29.6 % — ABNORMAL LOW (ref 34.8–46.6)
Hemoglobin: 9.9 g/dL — ABNORMAL LOW (ref 11.6–15.9)
Lymphocytes Relative: 5 %
Lymphs Abs: 0.3 10*3/uL — ABNORMAL LOW (ref 0.9–3.3)
MCH: 30.2 pg (ref 25.1–34.0)
MCHC: 33.4 g/dL (ref 31.5–36.0)
MCV: 90.4 fL (ref 79.5–101.0)
Monocytes Absolute: 0.7 10*3/uL (ref 0.1–0.9)
Monocytes Relative: 12 %
Neutro Abs: 4.6 10*3/uL (ref 1.5–6.5)
Neutrophils Relative %: 83 %
PLATELETS: 213 10*3/uL (ref 145–400)
RBC: 3.28 MIL/uL — AB (ref 3.70–5.45)
RDW: 19.3 % — AB (ref 11.2–14.5)
WBC: 5.6 10*3/uL (ref 3.9–10.3)

## 2017-11-16 LAB — MAGNESIUM: Magnesium: 2.1 mg/dL (ref 1.5–2.5)

## 2017-11-16 NOTE — Telephone Encounter (Signed)
-----   Message from Heath Lark, MD sent at 11/16/2017  9:15 AM EST ----- Regarding: labs are great Let her know labs are good No need inj ----- Message ----- From: Buel Ream, Lab In [place] Sent: 11/16/2017   8:41 AM To: Heath Lark, MD

## 2017-11-16 NOTE — Telephone Encounter (Signed)
Called with below message. Verbalized understanding. 

## 2017-11-19 NOTE — Progress Notes (Signed)
Spoke with:  Maureen Vaughan NPO:  After Midnight, no gum, candy, or mints   Arrival time:  0645 Labs: CBC, CMP, Mag in epic 11/16/17 AM medications:  None Pre op orders:  Yes Ride home:  Spouse

## 2017-11-20 ENCOUNTER — Encounter (HOSPITAL_BASED_OUTPATIENT_CLINIC_OR_DEPARTMENT_OTHER)
Admission: RE | Disposition: A | Discharge status: Home / Self Care | Payer: Self-pay | Source: Ambulatory Visit | Attending: Radiation Oncology

## 2017-11-20 ENCOUNTER — Encounter (HOSPITAL_BASED_OUTPATIENT_CLINIC_OR_DEPARTMENT_OTHER): Payer: Self-pay

## 2017-11-20 ENCOUNTER — Ambulatory Visit
Admission: RE | Admit: 2017-11-20 | Discharge: 2017-11-20 | Disposition: A | Discharge status: Home / Self Care | Payer: Medicare HMO | Source: Ambulatory Visit | Attending: Radiation Oncology | Admitting: Radiation Oncology

## 2017-11-20 ENCOUNTER — Ambulatory Visit (HOSPITAL_COMMUNITY)
Admission: RE | Admit: 2017-11-20 | Discharge: 2017-11-20 | Disposition: A | Discharge status: Home / Self Care | Payer: Medicare HMO | Source: Ambulatory Visit | Attending: Radiation Oncology | Admitting: Radiation Oncology

## 2017-11-20 ENCOUNTER — Ambulatory Visit (HOSPITAL_BASED_OUTPATIENT_CLINIC_OR_DEPARTMENT_OTHER)
Admission: RE | Admit: 2017-11-20 | Discharge: 2017-11-20 | Disposition: A | Discharge status: Home / Self Care | Payer: Medicare HMO | Source: Ambulatory Visit | Attending: Radiation Oncology | Admitting: Radiation Oncology

## 2017-11-20 ENCOUNTER — Ambulatory Visit (HOSPITAL_BASED_OUTPATIENT_CLINIC_OR_DEPARTMENT_OTHER): Payer: Medicare HMO | Admitting: Anesthesiology

## 2017-11-20 VITALS — BP 133/70 | HR 104

## 2017-11-20 DIAGNOSIS — C539 Malignant neoplasm of cervix uteri, unspecified: Secondary | ICD-10-CM | POA: Diagnosis not present

## 2017-11-20 DIAGNOSIS — Z923 Personal history of irradiation: Secondary | ICD-10-CM | POA: Diagnosis not present

## 2017-11-20 DIAGNOSIS — C530 Malignant neoplasm of endocervix: Secondary | ICD-10-CM

## 2017-11-20 DIAGNOSIS — Z800 Family history of malignant neoplasm of digestive organs: Secondary | ICD-10-CM | POA: Diagnosis not present

## 2017-11-20 DIAGNOSIS — Z9221 Personal history of antineoplastic chemotherapy: Secondary | ICD-10-CM | POA: Diagnosis not present

## 2017-11-20 HISTORY — PX: TANDEM RING INSERTION: SHX6199

## 2017-11-20 SURGERY — INSERTION, UTERINE TANDEM AND RING OR CYLINDER, FOR BRACHYTHERAPY
Anesthesia: General

## 2017-11-20 MED ORDER — SCOPOLAMINE 1 MG/3DAYS TD PT72
MEDICATED_PATCH | TRANSDERMAL | Status: AC
  Filled 2017-11-20: qty 1

## 2017-11-20 MED ORDER — LIDOCAINE 2% (20 MG/ML) 5 ML SYRINGE
INTRAMUSCULAR | Status: AC
  Filled 2017-11-20: qty 5

## 2017-11-20 MED ORDER — ONDANSETRON HCL 4 MG/2ML IJ SOLN
4.0000 mg | Freq: Once | INTRAMUSCULAR | Status: DC | PRN
  Filled 2017-11-20: qty 2

## 2017-11-20 MED ORDER — FENTANYL CITRATE (PF) 100 MCG/2ML IJ SOLN
INTRAMUSCULAR | Status: AC
  Filled 2017-11-20: qty 4

## 2017-11-20 MED ORDER — ACETAMINOPHEN 500 MG PO TABS
1000.0000 mg | ORAL_TABLET | Freq: Once | ORAL | Status: AC
  Administered 2017-11-20: 1000 mg via ORAL
  Filled 2017-11-20: qty 2

## 2017-11-20 MED ORDER — DEXAMETHASONE SODIUM PHOSPHATE 10 MG/ML IJ SOLN
INTRAMUSCULAR | Status: AC
  Filled 2017-11-20: qty 1

## 2017-11-20 MED ORDER — ONDANSETRON HCL 4 MG/2ML IJ SOLN
INTRAMUSCULAR | Status: AC
  Filled 2017-11-20: qty 2

## 2017-11-20 MED ORDER — FENTANYL CITRATE (PF) 100 MCG/2ML IJ SOLN
25.0000 ug | INTRAMUSCULAR | Status: DC | PRN
  Administered 2017-11-20: 25 ug via INTRAVENOUS
  Filled 2017-11-20: qty 1

## 2017-11-20 MED ORDER — PHENYLEPHRINE 40 MCG/ML (10ML) SYRINGE FOR IV PUSH (FOR BLOOD PRESSURE SUPPORT)
PREFILLED_SYRINGE | INTRAVENOUS | Status: AC
  Filled 2017-11-20: qty 10

## 2017-11-20 MED ORDER — LACTATED RINGERS IV SOLN
INTRAVENOUS | Status: DC
  Administered 2017-11-20: 13:00:00 via INTRAVENOUS
  Filled 2017-11-20 (×2): qty 250

## 2017-11-20 MED ORDER — PHENYLEPHRINE HCL 10 MG/ML IJ SOLN
INTRAMUSCULAR | Status: DC | PRN
  Administered 2017-11-20 (×2): 80 ug via INTRAVENOUS

## 2017-11-20 MED ORDER — DEXAMETHASONE SODIUM PHOSPHATE 10 MG/ML IJ SOLN
INTRAMUSCULAR | Status: DC | PRN
  Administered 2017-11-20: 10 mg via INTRAVENOUS

## 2017-11-20 MED ORDER — FENTANYL CITRATE (PF) 100 MCG/2ML IJ SOLN
INTRAMUSCULAR | Status: AC
  Filled 2017-11-20: qty 2

## 2017-11-20 MED ORDER — ONDANSETRON HCL 4 MG/2ML IJ SOLN
INTRAMUSCULAR | Status: DC | PRN
  Administered 2017-11-20: 4 mg via INTRAVENOUS

## 2017-11-20 MED ORDER — HYDROMORPHONE HCL 1 MG/ML IJ SOLN
0.5000 mg | Freq: Once | INTRAMUSCULAR | Status: AC
  Administered 2017-11-20: 0.5 mg via INTRAVENOUS
  Filled 2017-11-20: qty 1

## 2017-11-20 MED ORDER — HYDROMORPHONE HCL 1 MG/ML IJ SOLN
INTRAMUSCULAR | Status: AC
  Filled 2017-11-20: qty 1

## 2017-11-20 MED ORDER — LIDOCAINE 2% (20 MG/ML) 5 ML SYRINGE
INTRAMUSCULAR | Status: DC | PRN
  Administered 2017-11-20: 80 mg via INTRAVENOUS

## 2017-11-20 MED ORDER — KETOROLAC TROMETHAMINE 30 MG/ML IJ SOLN
INTRAMUSCULAR | Status: AC
  Filled 2017-11-20: qty 1

## 2017-11-20 MED ORDER — PROPOFOL 10 MG/ML IV BOLUS
INTRAVENOUS | Status: DC | PRN
  Administered 2017-11-20: 30 mg via INTRAVENOUS
  Administered 2017-11-20: 40 mg via INTRAVENOUS
  Administered 2017-11-20: 160 mg via INTRAVENOUS

## 2017-11-20 MED ORDER — KETOROLAC TROMETHAMINE 30 MG/ML IJ SOLN
INTRAMUSCULAR | Status: DC | PRN
  Administered 2017-11-20: 30 mg via INTRAVENOUS

## 2017-11-20 MED ORDER — LACTATED RINGERS IV SOLN
INTRAVENOUS | Status: DC
  Administered 2017-11-20 (×2): via INTRAVENOUS
  Filled 2017-11-20 (×2): qty 1000

## 2017-11-20 MED ORDER — ACETAMINOPHEN 500 MG PO TABS
ORAL_TABLET | ORAL | Status: AC
  Filled 2017-11-20: qty 2

## 2017-11-20 MED ORDER — PROPOFOL 10 MG/ML IV BOLUS
INTRAVENOUS | Status: AC
  Filled 2017-11-20: qty 40

## 2017-11-20 MED ORDER — SCOPOLAMINE 1 MG/3DAYS TD PT72
1.0000 | MEDICATED_PATCH | TRANSDERMAL | Status: DC
  Administered 2017-11-20: 1.5 mg via TRANSDERMAL
  Filled 2017-11-20: qty 1

## 2017-11-20 MED ORDER — FENTANYL CITRATE (PF) 100 MCG/2ML IJ SOLN
INTRAMUSCULAR | Status: DC | PRN
  Administered 2017-11-20: 50 ug via INTRAVENOUS
  Administered 2017-11-20 (×2): 25 ug via INTRAVENOUS

## 2017-11-20 SURGICAL SUPPLY — 29 items
BAG URINE DRAINAGE (UROLOGICAL SUPPLIES) ×3 IMPLANT
BNDG CONFORM 2 STRL LF (GAUZE/BANDAGES/DRESSINGS) IMPLANT
CATH FOLEY 2WAY SLVR  5CC 16FR (CATHETERS) ×2
CATH FOLEY 2WAY SLVR 5CC 16FR (CATHETERS) ×1 IMPLANT
GLOVE BIO SURGEON STRL SZ7.5 (GLOVE) ×6 IMPLANT
GOWN STRL REUS W/ TWL LRG LVL3 (GOWN DISPOSABLE) ×2 IMPLANT
GOWN STRL REUS W/TWL LRG LVL3 (GOWN DISPOSABLE) ×4
HOLDER FOLEY CATH W/STRAP (MISCELLANEOUS) ×3 IMPLANT
HOVERMATT SINGLE USE (MISCELLANEOUS) ×3 IMPLANT
KIT RM TURNOVER CYSTO AR (KITS) ×3 IMPLANT
NEEDLE SPNL 22GX3.5 QUINCKE BK (NEEDLE) IMPLANT
PACK VAGINAL MINOR WOMEN LF (CUSTOM PROCEDURE TRAY) ×3 IMPLANT
PACKING VAGINAL (PACKING) IMPLANT
PAD ABD 8X10 STRL (GAUZE/BANDAGES/DRESSINGS) ×3 IMPLANT
PAD OB MATERNITY 4.3X12.25 (PERSONAL CARE ITEMS) IMPLANT
PLUG CATH AND CAP STER (CATHETERS) IMPLANT
SET IRRIG Y TYPE TUR BLADDER L (SET/KITS/TRAYS/PACK) ×3 IMPLANT
SUT PROLENE 0 SH 30 (SUTURE) IMPLANT
SUT SILK 2 0 30  PSL (SUTURE)
SUT SILK 2 0 30 PSL (SUTURE) IMPLANT
SYR BULB IRRIGATION 50ML (SYRINGE) IMPLANT
SYR CONTROL 10ML LL (SYRINGE) IMPLANT
SYRINGE 10CC LL (SYRINGE) ×3 IMPLANT
TOWEL OR 17X24 6PK STRL BLUE (TOWEL DISPOSABLE) ×6 IMPLANT
TUBE CONNECTING 12'X1/4 (SUCTIONS)
TUBE CONNECTING 12X1/4 (SUCTIONS) IMPLANT
WATER STERILE IRR 3000ML UROMA (IV SOLUTION) ×3 IMPLANT
WATER STERILE IRR 500ML POUR (IV SOLUTION) ×3 IMPLANT
YANKAUER SUCT BULB TIP NO VENT (SUCTIONS) IMPLANT

## 2017-11-20 NOTE — Anesthesia Preprocedure Evaluation (Addendum)
Anesthesia Evaluation  Patient identified by MRN, date of birth, ID band Patient awake    Reviewed: Allergy & Precautions, NPO status , Patient's Chart, lab work & pertinent test results  History of Anesthesia Complications (+) PONV and history of anesthetic complications  Airway Mallampati: II  TM Distance: >3 FB Neck ROM: Full    Dental  (+) Teeth Intact, Dental Advisory Given   Pulmonary  Lung nodules   Pulmonary exam normal breath sounds clear to auscultation       Cardiovascular Exercise Tolerance: Good negative cardio ROS Normal cardiovascular exam Rhythm:Regular Rate:Normal     Neuro/Psych negative neurological ROS  negative psych ROS   GI/Hepatic negative GI ROS, Neg liver ROS,   Endo/Other  negative endocrine ROS  Renal/GU negative Renal ROS     Musculoskeletal negative musculoskeletal ROS (+)   Abdominal   Peds  Hematology  (+) Blood dyscrasia (Chemotherapy induced neutropenia ), anemia ,   Anesthesia Other Findings Day of surgery medications reviewed with the patient. Pt relayed [place]. Patch helped her last week with post-op nausea.  Reproductive/Obstetrics Cervical adenocarcinoma                            Anesthesia Physical Anesthesia Plan  ASA: III  Anesthesia Plan: General   Post-op Pain Management:    Induction: Intravenous  PONV Risk Score and Plan: 4 or greater and Dexamethasone, Ondansetron, Midazolam and Scopolamine patch - Pre-op  Airway Management Planned: LMA  Additional Equipment:   Intra-op Plan:   Post-operative Plan: Extubation in OR  Informed Consent: I have reviewed the patients History and Physical, chart, labs and discussed the procedure including the risks, benefits and alternatives for the proposed anesthesia with the patient or authorized representative who has indicated his/her understanding and acceptance.   Dental advisory  given  Plan Discussed with: CRNA  Anesthesia Plan Comments: (Risks/benefits of general anesthesia discussed with patient including risk of damage to teeth, lips, gum, and tongue, nausea/vomiting, allergic reactions to medications, and the possibility of heart attack, stroke and death.  All patient questions answered.  Patient wishes to proceed.)        Anesthesia Quick Evaluation

## 2017-11-20 NOTE — Transfer of Care (Signed)
Immediate Anesthesia Transfer of Care Note  Patient: Maureen Vaughan  Procedure(s) Performed: TANDEM RING INSERTION (N/A )  Patient Location: PACU  Anesthesia Type:General  Level of Consciousness: awake, alert , oriented and patient cooperative  Airway & Oxygen Therapy: Patient Spontanous Breathing and Patient connected to nasal cannula oxygen  Post-op Assessment: Report given to RN and Post -op Vital signs reviewed and stable  Post vital signs: Reviewed and stable  Last Vitals:  Vitals:   11/20/17 0629  BP: 128/76  Pulse: 93  Resp: 16  Temp: 36.7 C  SpO2: 100%    Last Pain:  Vitals:   11/20/17 0629  TempSrc: Oral      Patients Stated Pain Goal: 6 (01/75/10 2585)  Complications: No apparent anesthesia complications

## 2017-11-20 NOTE — Anesthesia Procedure Notes (Signed)
Procedure Name: LMA Insertion Date/Time: 11/20/2017 9:51 AM Performed by: Wanita Chamberlain, CRNA Pre-anesthesia Checklist: Patient identified, Emergency Drugs available, Suction available, Timeout performed and Patient being monitored Patient Re-evaluated:Patient Re-evaluated prior to induction Oxygen Delivery Method: Circle system utilized Preoxygenation: Pre-oxygenation with 100% oxygen Induction Type: IV induction Ventilation: Mask ventilation without difficulty LMA: LMA inserted LMA Size: 4.0 Number of attempts: 1 Placement Confirmation: breath sounds checked- equal and bilateral,  CO2 detector and positive ETCO2 Tube secured with: Tape Dental Injury: Teeth and Oropharynx as per pre-operative assessment

## 2017-11-20 NOTE — Progress Notes (Signed)
IMMEDIATELY FOLLOWING SURGERY: Do not drive or operate machinery for the first twenty four hours after surgery. Do not make any important decisions for twenty four hours after surgery or while taking narcotic pain medications or sedatives. If you develop intractable nausea and vomiting or a severe headache please notify your doctor immediately.   FOLLOW-UP: You do not need to follow up with anesthesia unless specifically instructed to do so.   WOUND CARE INSTRUCTIONS (if applicable): Expect some mild vaginal bleeding, but if large amount of bleeding occurs please contact Dr. Sondra Come at [phone] or the Radiation On-Call physician. Call for any fever greater than 101.0 degrees or increasing vaginal//abdominal pain or trouble urinating.   QUESTIONS?: Please feel free to call your physician or the hospital operator if you have any questions, and they will be happy to assist you. Resume all medications: as listed on your after visit summary. Your next appointment is:  Future Appointments  Date Time Provider [place]  11/23/2017  8:00 AM [facility] 6 [facility] None  12/24/2017 11:00 AM Gery Pray, MD [facility] None  12/26/2017  8:00 AM [facility] LAB 6 [facility] None  12/26/2017  8:15 AM [facility] FLUSH NURSE [facility] None  12/27/2017 11:30 AM Heath Lark, MD [facility] None

## 2017-11-20 NOTE — Progress Notes (Signed)
  Radiation Oncology         [phone] ________________________________  Name: Maureen Vaughan MRN: [id number]  Date: 11/20/2017  DOB: 1950-11-20  CC: [name], MD  Marti Sleigh  HDR BRACHYTHERAPY NOTE  DIAGNOSIS: Cervical cancer  NARRATIVE: The patient was brought to the HDR suite. Identity was confirmed. All relevant records and images related to the planned course of therapy were reviewed. The patient freely provided informed written consent to proceed with treatment after reviewing the details related to the planned course of therapy. The consent form was witnessed and verified by the simulation staff. Then, the patient was set-up in a stable reproducible supine position for radiation therapy. The tandem ring system was accessed and fiducial markers were placed within the tandem and ring.   Simple treatment device note: On the operating room the patient had construction of her custom tandem ring system. She will be treated with a 60 tandem/ring system. The patient had placement of a 60 mm tandem. A cervical ring with a small shielding was used for her treatment. A rectal paddle was also part of her custom set up device.  Verification simulation note: An AP and lateral film was obtained through the pelvis area. This was compared to the patient's planning films documenting accurate position of the tandem/ring system for treatment.  High-dose-rate brachytherapy treatment note:  The remote afterloading device was accessed through catheter system and attached to the tandem ring system. Patient then proceeded to undergo her fifth high-dose-rate treatment directed at the cervix. The patient was prescribed a dose of 5.5 gray to be delivered to the [place].Marland Kitchen Patient was treated with 2 channels using 19 dwell positions. Treatment time was 515.1 seconds. The patient tolerated the procedure well. After completion of her therapy, a radiation survey was performed documenting return of  the iridium source into the [employer] safe. The patient was then transferred to the nursing suite. She then had removal of the rectal paddle followed by the tandem and ring system. The patient tolerated the removal well.  PLAN: The patient has completed her definitive course of radiation therapy. She will return for routine follow-up in one month ________________________________  Blair Promise, PhD, MD

## 2017-11-20 NOTE — Anesthesia Postprocedure Evaluation (Signed)
Anesthesia Post Note  Patient: Maureen Vaughan  Procedure(s) Performed: TANDEM RING INSERTION (N/A )     Patient location during evaluation: PACU Anesthesia Type: General Level of consciousness: awake and alert Pain management: pain level controlled Vital Signs Assessment: post-procedure vital signs reviewed and stable Respiratory status: spontaneous breathing, nonlabored ventilation and respiratory function stable Cardiovascular status: blood pressure returned to baseline and stable Postop Assessment: no apparent nausea or vomiting Anesthetic complications: no    Last Vitals:  Vitals:   11/20/17 1100 11/20/17 1115  BP: 132/63 140/65  Pulse: 83 83  Resp: 12 13  Temp:    SpO2: 98% 100%    Last Pain:  Vitals:   11/20/17 1115  TempSrc:   PainSc: [place]

## 2017-11-20 NOTE — Progress Notes (Signed)
Foley catheter removed intact. Right hand IV removed intact.  Pressure and dressing applied.  Patient given discharge instructions and paperwork.  She was brought to the lobby in a wheelchair with her family.

## 2017-11-20 NOTE — Interval H&P Note (Signed)
Pt examined, chart reviewed and procedure discussed with pt. And husband.  She is ready for surgery.  Blair Promise, MD

## 2017-11-20 NOTE — Op Note (Signed)
      11/20/17 10:55 am  PATIENT:  Maureen Vaughan  67 y.o. female  PRE-OPERATIVE DIAGNOSIS:  ENDO CERVIX  POST-OPERATIVE DIAGNOSIS:  ENDO CERVIX  PROCEDURE:  Procedure(s): TANDEM RING INSERTION (N/A)  SURGEON:  Surgeon(s) and Role:    * Gery Pray, MD - Primary  PHYSICIAN ASSISTANT:   ASSISTANTS: none   ANESTHESIA:   general  EBL:  5 mL   BLOOD ADMINISTERED:none  DRAINS: Urinary Catheter (Foley)   LOCAL MEDICATIONS USED:  NONE  SPECIMEN:  No Specimen  DISPOSITION OF SPECIMEN:  N/A  COUNTS:  YES  TOURNIQUET:  * No tourniquets in log *  DICTATION: Timeout was performed for the procedure preoperative medications, allergies.  Patient was prepped and draped in usual sterile fashion and placed in the dorsal lithotomy position.  A Foley catheter was inserted without difficulty.  The bladder was then backfilled with approximately 250 cc of sterile water for imaging  purposes with transabdominal ultrasound. The cervical sleeve placed last week with her fourth procedure remained in place so dilation of the cervical os was not necessary. Sleeve was noted to be in good position based on ultrasound images . a 6060 mm tandemwasplaced within the endocervical canal and endometrial canal.  Patient then had a HDR Ring60degreeswith a small shielding placed at the cervical os region.Arectal paddle wasplaced posteriorly to limit dose to the rectum region. Ultrasound imaging at the completion of the procedure showed good placement of the tandem within the center,  slightly anterior position of the uterus, .Patient tolerated the procedure well. She was subsequent transferred to the recovery room in stable condition. Later in the day the patient will be taken down to radiation oncology for planning and herfifth  high-dose-rate treatment.    PLAN OF CARE: Transferred to radiation oncology for planning and treatment  PATIENT DISPOSITION:  PACU - hemodynamically  stable.   Delay start of Pharmacological VTE agent (>24hrs) due to surgical blood loss or risk of bleeding: not applicable

## 2017-11-20 NOTE — Progress Notes (Signed)
  Radiation Oncology         [phone] ________________________________  Name: Maureen Vaughan MRN: [id number]  Date: 11/20/2017  DOB: 1950-11-22  SIMULATION AND TREATMENT PLANNING NOTE HDR BRACHYTHERAPY  DIAGNOSIS:  Cervical cancer  NARRATIVE:  The patient was brought to the [place] suite.  Identity was confirmed.  All relevant records and images related to the planned course of therapy were reviewed.  The patient freely provided informed written consent to proceed with treatment after reviewing the details related to the planned course of therapy. The consent form was witnessed and verified by the simulation staff.  Then, the patient was set-up in a stable reproducible  supine position for radiation therapy.  CT images were obtained.  Surface markings were placed.  The CT images were loaded into the planning software.  Then the target and avoidance structures were contoured.  Treatment planning then occurred.  The radiation prescription was entered and confirmed.   I have requested : Brachytherapy Isodose Plan and Dosimetry Calculations to plan the radiation distribution.    PLAN:  The patient will receive 5.5 Gy in 1 fraction.  The patient will be treated with a tandem ring system using iridium 192 as the high-dose-rate source.    ________________________________  Blair Promise, PhD, MD

## 2017-11-21 ENCOUNTER — Encounter: Payer: Self-pay | Admitting: Radiation Oncology

## 2017-11-21 ENCOUNTER — Encounter (HOSPITAL_BASED_OUTPATIENT_CLINIC_OR_DEPARTMENT_OTHER): Payer: Self-pay | Admitting: Radiation Oncology

## 2017-11-21 NOTE — Progress Notes (Signed)
  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 pelvis, 1.8 Gy in 25 fractions for a total dose of 45 Gy           2. Sidewall Boost, 1.8 Gy in 5 fractions for a total dose of 9 Gy           3. Nodal boost  2, 1.8 Gy in 2 fractions for a total dose of 3.6 Gy           4. Cervix, 5.5 Gy (11/06/17), 6 Gy (11/08/17), 6 Gy (11/13/17), 5.5 Gy (11/15/17), 5.5 Gy (11/20/17)  Beams/energy:  1. 3D, 15X        2. 3D, 15X        3. 3D, 15X        4. Brachytherapy via tandem-ring, Iridium-192  Narrative: The patient tolerated radiation treatment relatively well. At the beginning of treatment, pt denied pain, nausea, vomiting, rectal bleeding, or urinary/bowel issues. She noted mild fatigue and mild vaginal bleeding. Towards the end of treatment, pt reported mild nausea and occasional fatigue. She denied pain, bladder issues, diarrhea, vaginal bleeding. On PE, with exam under anesthesia the cervical mass is decreased somewhat in size but continued to be quite firm with palpation.    Plan: The patient has completed radiation treatment. The patient will return to radiation oncology clinic for routine followup in one month. I advised them to call or return sooner if they have any questions or concerns related to their recovery or treatment.  -----------------------------------  Blair Promise, PhD, MD  This document serves as a record of services personally performed by Gery Pray, MD. It was created on his behalf by [facility], a trained  medical scribe. The creation of this record is based on the scribe's personal observations and the provider's statements to them. This document has been checked and approved by the attending provider.

## 2017-11-23 ENCOUNTER — Inpatient Hospital Stay: Payer: Medicare HMO

## 2017-11-23 ENCOUNTER — Telehealth: Payer: Self-pay | Admitting: *Deleted

## 2017-11-23 DIAGNOSIS — C539 Malignant neoplasm of cervix uteri, unspecified: Secondary | ICD-10-CM | POA: Diagnosis not present

## 2017-11-23 DIAGNOSIS — R918 Other nonspecific abnormal finding of lung field: Secondary | ICD-10-CM

## 2017-11-23 LAB — CBC WITH DIFFERENTIAL/PLATELET
BASOS ABS: 0 10*3/uL (ref 0.0–0.1)
BASOS PCT: 0 %
EOS PCT: 2 %
Eosinophils Absolute: 0.1 10*3/uL (ref 0.0–0.5)
HEMATOCRIT: 31.9 % — AB (ref 34.8–46.6)
Hemoglobin: 10.5 g/dL — ABNORMAL LOW (ref 11.6–15.9)
Lymphocytes Relative: 7 %
Lymphs Abs: 0.3 10*3/uL — ABNORMAL LOW (ref 0.9–3.3)
MCH: 30.3 pg (ref 25.1–34.0)
MCHC: 33 g/dL (ref 31.5–36.0)
MCV: 91.8 fL (ref 79.5–101.0)
MONO ABS: 0.5 10*3/uL (ref 0.1–0.9)
Monocytes Relative: 12 %
NEUTROS ABS: 3.3 10*3/uL (ref 1.5–6.5)
Neutrophils Relative %: 79 %
PLATELETS: 218 10*3/uL (ref 145–400)
RBC: 3.48 MIL/uL — AB (ref 3.70–5.45)
RDW: 19.5 % — AB (ref 11.2–14.5)
WBC: 4.3 10*3/uL (ref 3.9–10.3)

## 2017-11-23 LAB — COMPREHENSIVE METABOLIC PANEL
ALBUMIN: 3.7 g/dL (ref 3.5–5.0)
ALK PHOS: 66 U/L (ref 40–150)
ALT: 7 U/L (ref 0–55)
ANION GAP: 9 (ref 3–11)
AST: 13 U/L (ref 5–34)
BUN: 10 mg/dL (ref 7–26)
CALCIUM: 9.4 mg/dL (ref 8.4–10.4)
CO2: 28 mmol/L (ref 22–29)
Chloride: 107 mmol/L (ref 98–109)
Creatinine, Ser: 0.94 mg/dL (ref 0.60–1.10)
GFR calc Af Amer: >60 mL/min (ref >60)
GFR calc non Af Amer: >60 mL/min (ref >60)
GLUCOSE: 98 mg/dL (ref 70–140)
Potassium: 4.3 mmol/L (ref 3.5–5.1)
Sodium: 144 mmol/L (ref 136–145)
TOTAL PROTEIN: 6.8 g/dL (ref 6.4–8.3)
Total Bilirubin: 0.3 mg/dL (ref 0.2–1.2)

## 2017-11-23 LAB — MAGNESIUM: Magnesium: 2 mg/dL (ref 1.5–2.5)

## 2017-11-23 NOTE — Telephone Encounter (Signed)
Notified of message below

## 2017-11-23 NOTE — Telephone Encounter (Signed)
-----   Message from Heath Lark, MD sent at 11/23/2017  9:06 AM EST ----- Regarding: labs PLs let her know labs are good ----- Message ----- From: Interface, Lab In [place] Sent: 11/23/2017   8:32 AM To: Heath Lark, MD

## 2017-11-27 ENCOUNTER — Ambulatory Visit: Payer: Medicare HMO | Admitting: Radiation Oncology

## 2017-11-27 ENCOUNTER — Ambulatory Visit (HOSPITAL_BASED_OUTPATIENT_CLINIC_OR_DEPARTMENT_OTHER): Admit: 2017-11-27 | Payer: Medicare HMO | Admitting: Radiation Oncology

## 2017-11-27 ENCOUNTER — Encounter (HOSPITAL_BASED_OUTPATIENT_CLINIC_OR_DEPARTMENT_OTHER): Payer: Self-pay

## 2017-11-27 SURGERY — INSERTION, UTERINE TANDEM AND RING OR CYLINDER, FOR BRACHYTHERAPY
Anesthesia: General

## 2017-12-17 ENCOUNTER — Telehealth: Payer: Self-pay

## 2017-12-17 NOTE — Telephone Encounter (Signed)
Spoke with pt by phone to let her know PET scan has been scheduled for 3/13 at 0930.  She cannot eat or drink for at least 6hrs before.  Pt verbalizes understanding of instructions and appt date/time

## 2017-12-21 ENCOUNTER — Encounter: Payer: Self-pay | Admitting: Oncology

## 2017-12-24 ENCOUNTER — Telehealth: Payer: Self-pay | Admitting: *Deleted

## 2017-12-24 ENCOUNTER — Encounter: Payer: Self-pay | Admitting: Radiation Oncology

## 2017-12-24 ENCOUNTER — Ambulatory Visit
Admission: RE | Admit: 2017-12-24 | Discharge: 2017-12-24 | Disposition: A | Discharge status: Home / Self Care | Payer: Medicare HMO | Source: Ambulatory Visit | Attending: Radiation Oncology | Admitting: Radiation Oncology

## 2017-12-24 ENCOUNTER — Other Ambulatory Visit: Payer: Self-pay

## 2017-12-24 VITALS — BP 138/74 | HR 118 | Temp 98.6°F | Ht 65.0 in | Wt 151.8 lb

## 2017-12-24 DIAGNOSIS — C530 Malignant neoplasm of endocervix: Secondary | ICD-10-CM

## 2017-12-24 NOTE — Telephone Encounter (Signed)
Called patient to inform of appt. With Dr. Fermin Schwab on 12-27-17 @ 2:30 pm, spoke with patient and she is aware of this appt.

## 2017-12-24 NOTE — Progress Notes (Signed)

## 2017-12-24 NOTE — Progress Notes (Signed)
Maureen Vaughan is here for follow up.  She denies having any pain, urinary/bowel issues or vaginal bleeding.  She reports her appetite and energy level is much better.  She has been given size S+ and M vaginal dilators and was instructed on how to use them.  BP 138/74 (BP Location: Right Arm, Patient Position: Sitting)   Pulse (!) 118   Temp 98.6 F (37 C) (Oral)   Ht 5\' 5"  (1.651 m)   Wt 151 lb 12.8 oz (68.9 kg)   SpO2 100%   BMI 25.26 kg/m    Wt Readings from Last 3 Encounters:  12/24/17 151 lb 12.8 oz (68.9 kg)  11/20/17 150 lb 6.4 oz (68.2 kg)  11/15/17 151 lb 3.2 oz (68.6 kg)

## 2017-12-24 NOTE — Progress Notes (Signed)
  Radiation Oncology         [phone] ________________________________  Name: Maureen Vaughan MRN: [id number]  Date: 12/24/2017  DOB: 04-15-1951  Follow-Up Visit Note  CC: Cloward, [name], MD  Marti Sleigh    ICD-10-CM   1. Malignant neoplasm of endocervix [facility]) C53.0     Diagnosis: Stage II-B adenocarcinoma of the cervixwith radiographically PET+ left external iliac adenopathy  Interval Since Last Radiation: 6 weeks  09/12/17-10/29/17: 45 Gy to the pelvis with 9 Gy sidewall boost and 3.6 Gy nodal boost 11/06/17-11/20/17: 28.5 Gy to the cervix  Narrative:  The patient returns today for routine follow-up. She denies pain, urinary/ bowel issues, or vaginal bleeding. She denies nausea or cramping. She reports her appetite and energy level is much better. She reports occasional itching at her porta-cath. She reports she feels as though she is "back to normal".                             ALLERGIES:  is allergic to tegaderm ag mesh [silver].  Meds: Current Outpatient Medications  Medication Sig Dispense Refill  . lidocaine-prilocaine (EMLA) cream Apply to affected area once 30 g 3   No current facility-administered medications for this encounter.     Physical Findings: The patient is in no acute distress. Patient is alert and oriented.  height is 5\' 5"  (1.651 m) and weight is 151 lb 12.8 oz (68.9 kg). Her oral temperature is 98.6 F (37 C). Her blood pressure is 138/74 and her pulse is 118 (abnormal). Her oxygen saturation is 100%. .  No significant changes. Lungs are clear to auscultation bilaterally. Heart has regular rate and rhythm. No palpable cervical, supraclavicular, or axillary adenopathy. Abdomen soft, non-tender, normal bowel sounds. Pelvic exam was deferred in light of her recent treatment completion.   Lab Findings: Lab Results  Component Value Date   WBC 4.3 11/23/2017   HGB 10.5 (L) 11/23/2017   HCT 31.9 (L) 11/23/2017   MCV 91.8 11/23/2017   PLT 218 11/23/2017    Radiographic Findings: No results found.  Impression:  Patient has recovered well without any side effects at  this time from her external beam, brachytherapy and radiosensitizing chemotherapy.  Plan:  Follow-up with Dr.Clarke-Pearson in 2 months. Follow-up with radiation oncology in 5 months. The patient was given a size S+ and M vaginal dilator and instructions on their use. The patient will undergo restaging PET scan on 12/26/17 and follow-up with Dr. Alvy Bimler on 12/27/17.  ____________________________________  This document serves as a record of services personally performed by Gery Pray, MD. It was created on his behalf by Bethann Humble, a trained medical scribe. The creation of this record is based on the scribe's personal observations and the provider's statements to them. This document has been checked and approved by the attending provider.

## 2017-12-26 ENCOUNTER — Inpatient Hospital Stay: Payer: Medicare HMO

## 2017-12-26 ENCOUNTER — Ambulatory Visit (HOSPITAL_COMMUNITY)
Admission: RE | Admit: 2017-12-26 | Discharge: 2017-12-26 | Disposition: A | Discharge status: Home / Self Care | Payer: Medicare HMO | Source: Ambulatory Visit | Attending: Hematology and Oncology | Admitting: Hematology and Oncology

## 2017-12-26 ENCOUNTER — Inpatient Hospital Stay: Payer: Medicare HMO | Attending: Hematology and Oncology

## 2017-12-26 DIAGNOSIS — Z79899 Other long term (current) drug therapy: Secondary | ICD-10-CM | POA: Insufficient documentation

## 2017-12-26 DIAGNOSIS — C530 Malignant neoplasm of endocervix: Secondary | ICD-10-CM | POA: Diagnosis not present

## 2017-12-26 DIAGNOSIS — R918 Other nonspecific abnormal finding of lung field: Secondary | ICD-10-CM | POA: Diagnosis not present

## 2017-12-26 DIAGNOSIS — Z923 Personal history of irradiation: Secondary | ICD-10-CM | POA: Insufficient documentation

## 2017-12-26 DIAGNOSIS — D61818 Other pancytopenia: Secondary | ICD-10-CM | POA: Insufficient documentation

## 2017-12-26 DIAGNOSIS — C7800 Secondary malignant neoplasm of unspecified lung: Secondary | ICD-10-CM | POA: Diagnosis not present

## 2017-12-26 DIAGNOSIS — Z9221 Personal history of antineoplastic chemotherapy: Secondary | ICD-10-CM | POA: Diagnosis not present

## 2017-12-26 DIAGNOSIS — C539 Malignant neoplasm of cervix uteri, unspecified: Secondary | ICD-10-CM

## 2017-12-26 LAB — CBC WITH DIFFERENTIAL/PLATELET
Basophils Absolute: 0 10*3/uL (ref 0.0–0.1)
Basophils Relative: 1 %
Eosinophils Absolute: 0.1 10*3/uL (ref 0.0–0.5)
Eosinophils Relative: 2 %
HEMATOCRIT: 32.1 % — AB (ref 34.8–46.6)
HEMOGLOBIN: 10.7 g/dL — AB (ref 11.6–15.9)
LYMPHS ABS: 0.4 10*3/uL — AB (ref 0.9–3.3)
Lymphocytes Relative: 14 %
MCH: 31.1 pg (ref 25.1–34.0)
MCHC: 33.3 g/dL (ref 31.5–36.0)
MCV: 93.2 fL (ref 79.5–101.0)
MONO ABS: 0.3 10*3/uL (ref 0.1–0.9)
MONOS PCT: 11 %
NEUTROS PCT: 72 %
Neutro Abs: 1.8 10*3/uL (ref 1.5–6.5)
Platelets: 184 10*3/uL (ref 145–400)
RBC: 3.45 MIL/uL — ABNORMAL LOW (ref 3.70–5.45)
RDW: 16.1 % — AB (ref 11.2–14.5)
WBC: 2.5 10*3/uL — ABNORMAL LOW (ref 3.9–10.3)

## 2017-12-26 LAB — COMPREHENSIVE METABOLIC PANEL
ALBUMIN: 3.7 g/dL (ref 3.5–5.0)
ALT: 15 U/L (ref 0–55)
AST: 22 U/L (ref 5–34)
Alkaline Phosphatase: 69 U/L (ref 40–150)
Anion gap: 7 (ref 3–11)
BUN: 9 mg/dL (ref 7–26)
CHLORIDE: 108 mmol/L (ref 98–109)
CO2: 26 mmol/L (ref 22–29)
Calcium: 9.6 mg/dL (ref 8.4–10.4)
Creatinine, Ser: 0.94 mg/dL (ref 0.60–1.10)
GFR calc non Af Amer: >60 mL/min (ref >60)
GLUCOSE: 102 mg/dL (ref 70–140)
Potassium: 4 mmol/L (ref 3.5–5.1)
SODIUM: 141 mmol/L (ref 136–145)
Total Bilirubin: 0.4 mg/dL (ref 0.2–1.2)
Total Protein: 6.8 g/dL (ref 6.4–8.3)

## 2017-12-26 LAB — GLUCOSE, CAPILLARY: Glucose-Capillary: 86 mg/dL (ref 65–99)

## 2017-12-26 LAB — MAGNESIUM: MAGNESIUM: 2 mg/dL (ref 1.5–2.5)

## 2017-12-26 MED ORDER — FLUDEOXYGLUCOSE F - 18 (FDG) INJECTION
7.5100 | Freq: Once | INTRAVENOUS | Status: AC | PRN
  Administered 2017-12-26: 7.51 via INTRAVENOUS

## 2017-12-26 MED ORDER — SODIUM CHLORIDE 0.9% FLUSH
10.0000 mL | Freq: Once | INTRAVENOUS | Status: AC
  Administered 2017-12-26: 10 mL
  Filled 2017-12-26: qty 10

## 2017-12-27 ENCOUNTER — Encounter: Payer: Self-pay | Admitting: Hematology and Oncology

## 2017-12-27 ENCOUNTER — Telehealth: Payer: Self-pay | Admitting: *Deleted

## 2017-12-27 ENCOUNTER — Inpatient Hospital Stay (HOSPITAL_BASED_OUTPATIENT_CLINIC_OR_DEPARTMENT_OTHER): Payer: Medicare HMO | Admitting: Hematology and Oncology

## 2017-12-27 ENCOUNTER — Telehealth: Payer: Self-pay

## 2017-12-27 DIAGNOSIS — R918 Other nonspecific abnormal finding of lung field: Secondary | ICD-10-CM | POA: Diagnosis not present

## 2017-12-27 DIAGNOSIS — Z7189 Other specified counseling: Secondary | ICD-10-CM | POA: Insufficient documentation

## 2017-12-27 DIAGNOSIS — D61818 Other pancytopenia: Secondary | ICD-10-CM | POA: Diagnosis not present

## 2017-12-27 DIAGNOSIS — C530 Malignant neoplasm of endocervix: Secondary | ICD-10-CM | POA: Diagnosis not present

## 2017-12-27 NOTE — Telephone Encounter (Signed)
-----   Message from Heath Lark, MD sent at 12/27/2017  1:28 PM EDT ----- Regarding: outside path I need you to call Dr. Charlesetta Garibaldi to get a copy of the outside pathology from last year. Once we have it, we need to add PD-L testing. Please work on this [place], [place]  Obstetrics & Gynecology  NPI: [id number]  [address] [place] [address]    Phone: [phone]

## 2017-12-27 NOTE — Progress Notes (Signed)
[place] OFFICE PROGRESS NOTE  Patient Care Team: Cloward, [name], MD as PCP - General (Internal Medicine) Heath Lark, MD as Consulting Physician (Hematology and Oncology) Gery Pray, MD as Consulting Physician (Radiation Oncology) Marti Sleigh, MD as Attending Physician (Gynecology)  ASSESSMENT & PLAN:  Malignant neoplasm of cervix [facility]) I have reviewed the imaging study with the patient extensively Unfortunately, PET/CT scan showed disease progression in her lungs The minimal activity seen in her pelvis are likely related to recent radiation treatment I reviewed the current guidelines with the patient I will request additional testing on her tumor for PDL 1 to see if she would qualify for immunotherapy We discussed the risks, benefits, side effects of combination chemotherapy with carboplatin, paclitaxel with or without Avastin The patient is undecided She is currently asymptomatic She has no residual side effects from prior treatment except for persistent leukopenia The additional PD1 testing could take up to 7-10 days Once I have the test results, I will schedule chemotherapy and bring her back for further discussion We also discussed potential referral to tertiary center for second opinion/clinical trial but the patient declines  Pancytopenia, acquired [facility]) She is not symptomatic.  Observe only  Multiple lung nodules The lung nodules are progressive with hypermetabolic activity, likely indicative of metastatic cancer to the lung We discussed the risk and benefits of biopsy For now, we are treating her as recurrence of cancer with progression of disease in her lungs  Goals of care, counseling/discussion The patient is aware she has incurable disease and treatment is strictly palliative. We discussed importance of Advanced Directives and Living will.   No orders of the defined types were placed in this encounter.   INTERVAL HISTORY: Please  see below for problem oriented charting. She returns to review test results She feels well Denies vaginal bleeding Appetite is stable Denies recent nausea, vomiting or changes in bowel habits Denies recent chest pain or shortness of breath  SUMMARY OF ONCOLOGIC HISTORY:   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[place])   07/04/2017 Initial Diagnosis    She presented to the GYN clinic with postmenopausal bleeding. Examination revealed abnormal cervix. Vaginal ultrasound showed a 10 cm uterus with an intramural fibroid, left ovary is slightly enlarged measuring 2.1 x 1.5 x 2.3 cm which is described as solid complex with a vascular flow. In addition the cervix is imaged showing a solid vascular mass measuring 2.7 x 2.1 x 2.2 cm. A sonohysterogram showed no abnormalities in the endometrium. Patient had a Pap smear showing high-grade squamous intraepithelial lesion with features suspicious for invasion as well as atypical glandular cells. 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      08/29/2017 PET scan    1. Cervical malignancy with maximum SUV 6.0, dominant cervical mass measuring 6.2 by 6.0 by 7.3 cm. 2. 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 3. There 12 scattered small pulmonary nodules measuring up to 5 mm in diameter. No hypermetabolic activity but these are below sensitive PET-CT size thresholds. These could be postinflammatory or less likely neoplastic, surveillance is recommended. 4. Hypodense thyroid nodules are not hypermetabolic which strongly favors benign etiology.      09/10/2017 Procedure    Placement of a subcutaneous port device.      09/12/2017 - 10/10/2017 Chemotherapy    The patient had weekly cisplatin for chemotherapy treatment.  09/12/2017 - 11/20/2017 Radiation Therapy    Radiation treatment dates:  External beam: 09/12/2017-10/29/2017,  brachytherapy: 11/06/2017, 11/08/2017, 11/13/2017, 11/15/2017, 11/20/2017  Site/dose:   1. pelvis, 1.8 Gy in 25 fractions for a total dose of 45 Gy                      2. Sidewall Boost, 1.8 Gy in 5 fractions for a total dose of 9 Gy                      3. Nodal boost  2, 1.8 Gy in 2 fractions for a total dose of 3.6 Gy                      4. Cervix, 5.5 Gy (11/06/17), 6 Gy (11/08/17), 6 Gy (11/13/17), 5.5 Gy (11/15/17), 5.5 Gy (11/20/17)  Beams/energy:  1. 3D, 15X                              2. 3D, 15X                              3. 3D, 15X                              4. Brachytherapy via tandem-ring, [id number]      12/27/2017 PET scan    1. Interval mixed response to therapy. Interval decrease in hypermetabolism associated with the cervical mass. Left pelvic sidewall hypermetabolic lymph node seen previously has resolved. 2. Interval progression of bilateral pulmonary nodules with demonstrable FDG accumulation in these nodules on today's study. Imaging features are compatible with metastatic disease. There is a new 3 mm nodule in the left upper lobe. 3. Focus of FDG accumulation in the left adnexal region, likely related to the left ovary but tracks up along the left side of the uterus. This is indeterminate and may be related to the ovary although metastatic involvement is not excluded. Close attention on follow-up recommended. Pelvic MRI without and with contrast may possibly provide additional insight as clinically warranted.       REVIEW OF SYSTEMS:   Constitutional: Denies fevers, chills or abnormal weight loss Eyes: Denies blurriness of vision Ears, nose, mouth, throat, and face: Denies mucositis or sore throat Respiratory: Denies cough, dyspnea or wheezes Cardiovascular: Denies palpitation, chest discomfort or lower extremity swelling Gastrointestinal:  Denies nausea, heartburn or change in bowel habits Skin: Denies abnormal skin rashes Lymphatics: Denies new lymphadenopathy or  easy bruising Neurological:Denies numbness, tingling or new weaknesses Behavioral/Psych: Mood is stable, no new changes  All other systems were reviewed with the patient and are negative.  I have reviewed the past medical history, past surgical history, social history and family history with the patient and they are unchanged from previous note.  ALLERGIES:  is allergic to tegaderm ag mesh [silver].  MEDICATIONS:  Current Outpatient Medications  Medication Sig Dispense Refill  . lidocaine-prilocaine (EMLA) cream Apply to affected area once 30 g 3   No current facility-administered medications for this visit.     PHYSICAL EXAMINATION: ECOG PERFORMANCE STATUS: 1 - Symptomatic but completely ambulatory  Vitals:   12/27/17 1121  BP: (!) 142/73  Pulse: 94  Resp: 16  Temp: 98.3 F (36.8 C)  SpO2: 99%   Filed Weights  12/27/17 1121  Weight: 152 lb 4.8 oz (69.1 kg)    GENERAL:alert, no distress and comfortable NEURO: alert & oriented x 3 with fluent speech, no focal motor/sensory deficits  LABORATORY DATA:  I have reviewed the data as listed    Component Value Date/Time   NA 141 12/26/2017 0806   NA 141 10/15/2017 0900   K 4.0 12/26/2017 0806   K 4.2 10/15/2017 0900   CL 108 12/26/2017 0806   CO2 26 12/26/2017 0806   CO2 28 10/15/2017 0900   GLUCOSE 102 12/26/2017 0806   GLUCOSE 105 10/15/2017 0900   BUN 9 12/26/2017 0806   BUN 10.8 10/15/2017 0900   CREATININE 0.94 12/26/2017 0806   CREATININE 1.1 10/15/2017 0900   CALCIUM 9.6 12/26/2017 0806   CALCIUM 9.3 10/15/2017 0900   PROT 6.8 12/26/2017 0806   PROT 7.1 10/15/2017 0900   ALBUMIN 3.7 12/26/2017 0806   ALBUMIN 3.7 10/15/2017 0900   AST 22 12/26/2017 0806   AST 17 10/15/2017 0900   ALT 15 12/26/2017 0806   ALT 19 10/15/2017 0900   ALKPHOS 69 12/26/2017 0806   ALKPHOS 95 10/15/2017 0900   BILITOT 0.4 12/26/2017 0806   BILITOT 0.29 10/15/2017 0900   GFRNONAA >60 12/26/2017 0806   GFRAA >60 12/26/2017  0806    No results found for: SPEP, UPEP  Lab Results  Component Value Date   WBC 2.5 (L) 12/26/2017   NEUTROABS 1.8 12/26/2017   HGB 10.7 (L) 12/26/2017   HCT 32.1 (L) 12/26/2017   MCV 93.2 12/26/2017   PLT 184 12/26/2017      Chemistry      Component Value Date/Time   NA 141 12/26/2017 0806   NA 141 10/15/2017 0900   K 4.0 12/26/2017 0806   K 4.2 10/15/2017 0900   CL 108 12/26/2017 0806   CO2 26 12/26/2017 0806   CO2 28 10/15/2017 0900   BUN 9 12/26/2017 0806   BUN 10.8 10/15/2017 0900   CREATININE 0.94 12/26/2017 0806   CREATININE 1.1 10/15/2017 0900      Component Value Date/Time   CALCIUM 9.6 12/26/2017 0806   CALCIUM 9.3 10/15/2017 0900   ALKPHOS 69 12/26/2017 0806   ALKPHOS 95 10/15/2017 0900   AST 22 12/26/2017 0806   AST 17 10/15/2017 0900   ALT 15 12/26/2017 0806   ALT 19 10/15/2017 0900   BILITOT 0.4 12/26/2017 0806   BILITOT 0.29 10/15/2017 0900       RADIOGRAPHIC STUDIES: I have reviewed multiple imaging study with the patient I have personally reviewed the radiological images as listed and agreed with the findings in the report. Nm Pet Image Restag (ps) Skull Base To Thigh  Result Date: 12/26/2017 CLINICAL DATA:  Subsequent treatment strategy for cervical cancer. EXAM: NUCLEAR MEDICINE PET SKULL BASE TO THIGH TECHNIQUE: 7.5 mCi F-18 FDG was injected intravenously. Full-ring PET imaging was performed from the skull base to thigh after the radiotracer. CT data was obtained and used for attenuation correction and anatomic localization. Fasting blood glucose: 86 mg/dl Mediastinal blood pool activity: SUV max 2.2 COMPARISON:  08/29/2017 FINDINGS: NECK: No hypermetabolic lymph nodes in the neck. Incidental CT findings: Similar appearance 3.6 cm right thyroid nodule without substantial hypermetabolism. Small nodules in the thyroid isthmus and left lobe are also stable. CHEST: Scattered bilateral pulmonary nodules are hypermetabolic and show interval progression  on CT imaging. Index 6 mm nodule left upper lobe (image 50/series 4) demonstrates SUV max = 2.9 and measured 2  mm on the prior study. Index 6 mm left upper lobe nodule (image 50/4) demonstrates SUV max = 2.5 and measured 4 mm previously. A third index nodule measured on today's study at 6 mm in the anterior right upper lobe (image 67/4) was 3 mm previously and demonstrates SUV max = 2.1 today. Other tiny nodules seen previously show similar mild interval enlargement. 3 mm left upper lobe nodule (image 61/4) appears to be new in the interval. Incidental CT findings: none ABDOMEN/PELVIS: Hypermetabolic left pelvic sidewall lymph node identified previously and measuring 1.5 cm in short axis on the previous exam has resolved. This measures 5 mm short axis today in shows no discernible hypermetabolism. There is FDG accumulation in the left adnexal space mapping to a 3.2 x 1.7 cm soft tissue structure on image 166/4. This is likely the ovary in on CT imaging has a similar appearance to the prior exam. The FDG accumulation then tracks up along the left side of the uterus. A small focus of FDG accumulation is identified anterior to the left psoas muscle, in the region of the ureter. There is no abnormal soft tissue or lymph node at this location and the activity is felt to be related to excreted radiotracer in the ureter. No abnormal hypermetabolic activity within the liver, pancreas, adrenal glands, or spleen. No hypermetabolic lymph nodes in the abdomen or pelvis. Incidental CT findings: none SKELETON: No focal hypermetabolic activity to suggest skeletal metastasis. Incidental CT findings: none IMPRESSION: 1. Interval mixed response to therapy. Interval decrease in hypermetabolism associated with the cervical mass. Left pelvic sidewall hypermetabolic lymph node seen previously has resolved. 2. Interval progression of bilateral pulmonary nodules with demonstrable FDG accumulation in these nodules on today's study. Imaging  features are compatible with metastatic disease. There is a new 3 mm nodule in the left upper lobe. 3. Focus of FDG accumulation in the left adnexal region, likely related to the left ovary but tracks up along the left side of the uterus. This is indeterminate and may be related to the ovary although metastatic involvement is not excluded. Close attention on follow-up recommended. Pelvic MRI without and with contrast may possibly provide additional insight as clinically warranted. Electronically Signed   By: Misty Stanley M.D.   On: 12/26/2017 14:22    All questions were answered. The patient knows to call the clinic with any problems, questions or concerns. No barriers to learning was detected.  I spent 40 minutes counseling the patient face to face. The total time spent in the appointment was 55 minutes and more than 50% was on counseling and review of test results  Heath Lark, MD 12/27/2017 1:33 PM

## 2017-12-27 NOTE — Assessment & Plan Note (Signed)
She is not symptomatic Observe only 

## 2017-12-27 NOTE — Assessment & Plan Note (Signed)
The patient is aware she has incurable disease and treatment is strictly palliative. We discussed importance of Advanced Directives and Living will. 

## 2017-12-27 NOTE — Assessment & Plan Note (Signed)
The lung nodules are progressive with hypermetabolic activity, likely indicative of metastatic cancer to the lung We discussed the risk and benefits of biopsy For now, we are treating her as recurrence of cancer with progression of disease in her lungs

## 2017-12-27 NOTE — Telephone Encounter (Signed)
Called with below message. They will fax copy of pathology.

## 2017-12-27 NOTE — Assessment & Plan Note (Signed)
I have reviewed the imaging study with the patient extensively Unfortunately, PET/CT scan showed disease progression in her lungs The minimal activity seen in her pelvis are likely related to recent radiation treatment I reviewed the current guidelines with the patient I will request additional testing on her tumor for PDL 1 to see if she would qualify for immunotherapy We discussed the risks, benefits, side effects of combination chemotherapy with carboplatin, paclitaxel with or without Avastin The patient is undecided She is currently asymptomatic She has no residual side effects from prior treatment except for persistent leukopenia The additional PD1 testing could take up to 7-10 days Once I have the test results, I will schedule chemotherapy and bring her back for further discussion We also discussed potential referral to tertiary center for second opinion/clinical trial but the patient declines

## 2017-12-27 NOTE — Telephone Encounter (Signed)
Called patinet and gave the appt for March 289th

## 2018-01-11 ENCOUNTER — Encounter: Payer: Self-pay | Admitting: Gynecology

## 2018-01-11 ENCOUNTER — Inpatient Hospital Stay (HOSPITAL_BASED_OUTPATIENT_CLINIC_OR_DEPARTMENT_OTHER): Payer: Medicare HMO | Admitting: Gynecology

## 2018-01-11 VITALS — BP 134/64 | HR 84 | Temp 97.9°F | Resp 18 | Wt 150.7 lb

## 2018-01-11 DIAGNOSIS — C530 Malignant neoplasm of endocervix: Secondary | ICD-10-CM | POA: Diagnosis not present

## 2018-01-11 NOTE — Progress Notes (Signed)
Consult Note: Gyn-Onc   Maureen Vaughan 67 y.o. female  Chief Complaint  Patient presents with  . 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  Plan: The patient's cervical cancer is being evaluated for PD1.  Should that return positive, I am in agreement with Dr. Alvy Bimler to treat with [facility].  Otherwise, conventional chemotherapy with Cis, Taxol and Avastin.  Return to see me in 3 months  Interval history:patient returns today for continuing followup having completed pelivic radiation and concurrent cisplatin.  She tolerated that treatment well.  Unfortunately, she now has clear cut progression of lung mets.   She denies any pulmonary symptoms. She has no further vaginal bleeding and has no GI, GU or pelvic symptoms.  HPI: 67 year old African American female seen in consultation at the request of Dr. Charlesetta Garibaldi regarding management of a newly diagnosed adenocarcinoma. The patient initially presented with postmenopausal spotting. The patient is had an extensive evaluation with the following findings. Vaginal ultrasound showed a 10 cm uterus with an intramural fibroid, left ovary is slightly enlarged measuring 2.1 x 1.5 x 2.3 cm which is described as solid complex with a vascular flow. In addition the cervix is imaged showing a solid vascular mass measuring 2.7 x 2.1 x 2.2 cm. A sonohysterogram showed no abnormalities in the endometrium. Patient had a Pap smear showing high-grade squamous intraepithelial lesion with features suspicious for invasion as well as atypical glandular cells. 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  The patient says she has had Pap smears in the last 2 years that were normal. She has no family history of gynecologic cancers. She has not had any pelvic surgery. Overall her health is good.  She was treated with pelvic radiation/brachy therapy  and concurrent cisplatin.  Following completion of pelvic radiation, she was found to have clearcut pulmonary mets.  Review of Systems:10 point review of systems is negative except as noted in interval history.   Vitals: Blood pressure 134/64, pulse 84, temperature 97.9 F (36.6 C), temperature source Oral, resp. rate 18, weight 150 lb 11.2 oz (68.4 kg), SpO2 100 %.  Physical Exam: General : The patient is a healthy woman in no acute distress.  HEENT: normocephalic, extraoccular movements normal; neck is supple without thyromegally  Lynphnodes: Supraclavicular and inguinal nodes not enlarged  Abdomen: Soft, non-tender, no ascites, no organomegally, no masses, no hernias  Pelvic:  EGBUS: Normal female  Vagina: Normal, no lesions  Urethra and Bladder: Normal, non-tender  Cervix: Flush with vaginal vault.  No lesions are seen Uterus:  Normal shape and size Bi-manual examination: No parametrial disease Rectal: normal sphincter tone, no masses, no blood  Lower extremities: No edema or varicosities. Normal range of motion      Allergies  Allergen Reactions  . Tegaderm Ag Mesh [Silver] Rash    "per pt caused rash and blisters"    (OPSITE OK TO USE)    Past Medical History:  Diagnosis Date  . Cervical adenocarcinoma [facility]) primary oncologist-  dr gorsuch/  oncologist-  dr Sondra Come   dx 07-04-2017  Stage IIB w/ lMETs to eft external iliac adenopathy--- completed adjunct chemo and external radiation (09-12-2017 to 10-19-2017) and to start direct high dose radiation 11-06-2017 x5 treatments  . Chemotherapy induced neutropenia ([place])   . Chronic nausea    due to chemo  . Complication of anesthesia  ponv 11-06-17  . History of colon polyps 2015   per pt benign  . History of radiation therapy 09/12/2017-10/29/2017   pelvis 45 Gy in 25 fractions, sidewall boost 9 Gy in 5 fractions, nodal boost 3.6 Gy in 2 fractions  . History of radiation therapy 11/06/17-11/20/17   brachytherapy via tandem  ring, cervix 5.5 Gy (11/06/17), 6 Gy (11/08/17), 6 Gy (11/13/17), 5.5 Gy (11/15/17) and 5.5 Gy (11/20/17)  . Leukopenia due to antineoplastic chemotherapy ([place])   . Multiple lung nodules   . Thyroid nodule   . Wears glasses     Past Surgical History:  Procedure Laterality Date  . COLONOSCOPY  2015  . IR FLUORO GUIDE PORT INSERTION RIGHT  09/10/2017  . IR US GUIDE VASC ACCESS RIGHT  09/10/2017  . TANDEM RING INSERTION N/A 11/06/2017   Procedure: TANDEM RING INSERTION;  Surgeon: Gery Pray, MD;  Location: [facility];  Service: Urology;  Laterality: N/A;  . TANDEM RING INSERTION N/A 11/08/2017   Procedure: TANDEM RING INSERTION;  Surgeon: Gery Pray, MD;  Location: [facility];  Service: Urology;  Laterality: N/A;  . TANDEM RING INSERTION N/A 11/13/2017   Procedure: TANDEM RING INSERTION;  Surgeon: Gery Pray, MD;  Location: [facility];  Service: Urology;  Laterality: N/A;  . TANDEM RING INSERTION N/A 11/15/2017   Procedure: TANDEM RING INSERTION;  Surgeon: Gery Pray, MD;  Location: [facility];  Service: Urology;  Laterality: N/A;  . TANDEM RING INSERTION N/A 11/20/2017   Procedure: TANDEM RING INSERTION;  Surgeon: Gery Pray, MD;  Location: [facility];  Service: Urology;  Laterality: N/A;  . TONSILLECTOMY  child    Current Outpatient Medications  Medication Sig Dispense Refill  . lidocaine-prilocaine (EMLA) cream Apply to affected area once 30 g 3   No current facility-administered medications for this visit.     Social History   Socioeconomic History  . Marital status: Married    Spouse name: Not on file  . Number of children: 1  . Years of education: Not on file  . Highest education level: Not on file  Occupational History  . Occupation: retired  Scientific laboratory technician  . Financial resource strain: Not on file  . Food insecurity:    Worry: Not on file    Inability: Not on file  . Transportation  needs:    Medical: Not on file    Non-medical: Not on file  Tobacco Use  . Smoking status: Never Smoker  . Smokeless tobacco: Never Used  Substance and Sexual Activity  . Alcohol use: No  . Drug use: No  . Sexual activity: Yes  Lifestyle  . Physical activity:    Days per week: Not on file    Minutes per session: Not on file  . Stress: Not on file  Relationships  . Social connections:    Talks on phone: Not on file    Gets together: Not on file    Attends religious service: Not on file    Active member of club or organization: Not on file    Attends meetings of clubs or organizations: Not on file    Relationship status: Not on file  . Intimate partner violence:    Fear of current or ex partner: Not on file    Emotionally abused: Not on file    Physically abused: Not on file    Forced sexual activity: Not on file  Other Topics Concern  . Not  on file  Social History Narrative  . Not on file    Family History  Problem Relation Age of Onset  . Hypertension Mother   . Dementia Mother   . Colon cancer Father 10      Marti Sleigh, MD 01/11/2018, 10:58 AM

## 2018-01-11 NOTE — Patient Instructions (Signed)
Plan to follow up in three months with Dr. Fermin Schwab and follow up with Dr. Alvy Bimler.  We are waiting for PLD 1 results.

## 2018-01-15 ENCOUNTER — Telehealth: Payer: Self-pay

## 2018-01-15 ENCOUNTER — Other Ambulatory Visit: Payer: Self-pay | Admitting: Hematology and Oncology

## 2018-01-15 DIAGNOSIS — C7800 Secondary malignant neoplasm of unspecified lung: Secondary | ICD-10-CM

## 2018-01-15 DIAGNOSIS — C530 Malignant neoplasm of endocervix: Secondary | ICD-10-CM

## 2018-01-15 NOTE — Telephone Encounter (Signed)
Scheduling msg sent 

## 2018-01-15 NOTE — Progress Notes (Signed)
DISCONTINUE OFF PATHWAY REGIMEN - [Other Dx]   [id number]:Cisplatin 40 mg/m2 weekly (4 weeks per order sheet):   Administer weekly:     Cisplatin   **Always confirm dose/schedule in your pharmacy ordering system**    REASON: Disease Progression PRIOR TREATMENT: Cisplatin 40 mg/m2 weekly (4 weeks per order sheet) TREATMENT RESPONSE: Progressive Disease (PD)  START OFF PATHWAY REGIMEN - [Other Dx]   [id number]:Carboplatin + Paclitaxel (5/175) q21 Days:   A cycle is every 21 days:     Paclitaxel      Carboplatin   **Always confirm dose/schedule in your pharmacy ordering system**    Patient Characteristics: Intent of Therapy: Non-Curative / Palliative Intent, Discussed with Patient

## 2018-01-15 NOTE — Telephone Encounter (Signed)
Called per Dr. Alvy Bimler to see what day next week would work better for her to see Dr. Alvy Bimler. She prefers Monday 4/8. Informed that her treatment day would start on 4/11 and scheduling would call with appt times. Verbalized understanding.

## 2018-01-16 ENCOUNTER — Telehealth: Payer: Self-pay | Admitting: Hematology and Oncology

## 2018-01-16 NOTE — Telephone Encounter (Signed)
Spoke to patient regarding upcoming April and may appointments per 4/2 sch message

## 2018-01-20 ENCOUNTER — Encounter: Payer: Self-pay | Admitting: Hematology and Oncology

## 2018-01-22 ENCOUNTER — Other Ambulatory Visit: Payer: Self-pay | Admitting: Hematology and Oncology

## 2018-01-22 ENCOUNTER — Inpatient Hospital Stay: Payer: Medicare HMO | Attending: Hematology and Oncology

## 2018-01-22 ENCOUNTER — Encounter: Payer: Self-pay | Admitting: Hematology and Oncology

## 2018-01-22 ENCOUNTER — Inpatient Hospital Stay (HOSPITAL_BASED_OUTPATIENT_CLINIC_OR_DEPARTMENT_OTHER): Payer: Medicare HMO | Admitting: Hematology and Oncology

## 2018-01-22 VITALS — BP 125/62 | HR 77 | Temp 98.1°F | Resp 18 | Ht 65.0 in | Wt 150.8 lb

## 2018-01-22 DIAGNOSIS — C539 Malignant neoplasm of cervix uteri, unspecified: Secondary | ICD-10-CM

## 2018-01-22 DIAGNOSIS — C530 Malignant neoplasm of endocervix: Secondary | ICD-10-CM | POA: Diagnosis not present

## 2018-01-22 DIAGNOSIS — Z79899 Other long term (current) drug therapy: Secondary | ICD-10-CM

## 2018-01-22 DIAGNOSIS — D61818 Other pancytopenia: Secondary | ICD-10-CM

## 2018-01-22 DIAGNOSIS — C7800 Secondary malignant neoplasm of unspecified lung: Secondary | ICD-10-CM | POA: Diagnosis not present

## 2018-01-22 DIAGNOSIS — D72819 Decreased white blood cell count, unspecified: Secondary | ICD-10-CM | POA: Diagnosis not present

## 2018-01-22 DIAGNOSIS — Z5111 Encounter for antineoplastic chemotherapy: Secondary | ICD-10-CM | POA: Insufficient documentation

## 2018-01-22 DIAGNOSIS — R918 Other nonspecific abnormal finding of lung field: Secondary | ICD-10-CM

## 2018-01-22 DIAGNOSIS — Z7189 Other specified counseling: Secondary | ICD-10-CM

## 2018-01-22 LAB — COMPREHENSIVE METABOLIC PANEL
ALT: 11 U/L (ref 0–55)
ANION GAP: 7 (ref 3–11)
AST: 17 U/L (ref 5–34)
Albumin: 3.8 g/dL (ref 3.5–5.0)
Alkaline Phosphatase: 69 U/L (ref 40–150)
BUN: 11 mg/dL (ref 7–26)
CHLORIDE: 107 mmol/L (ref 98–109)
CO2: 29 mmol/L (ref 22–29)
Calcium: 9.7 mg/dL (ref 8.4–10.4)
Creatinine, Ser: 1.01 mg/dL (ref 0.60–1.10)
GFR calc non Af Amer: 56 mL/min — ABNORMAL LOW (ref >60)
Glucose, Bld: 91 mg/dL (ref 70–140)
Potassium: 3.7 mmol/L (ref 3.5–5.1)
SODIUM: 143 mmol/L (ref 136–145)
Total Bilirubin: 0.3 mg/dL (ref 0.2–1.2)
Total Protein: 6.9 g/dL (ref 6.4–8.3)

## 2018-01-22 LAB — CBC WITH DIFFERENTIAL/PLATELET
Basophils Absolute: 0 10*3/uL (ref 0.0–0.1)
Basophils Relative: 0 %
Eosinophils Absolute: 0.1 10*3/uL (ref 0.0–0.5)
Eosinophils Relative: 2 %
HEMATOCRIT: 34.7 % — AB (ref 34.8–46.6)
HEMOGLOBIN: 11.3 g/dL — AB (ref 11.6–15.9)
LYMPHS ABS: 0.5 10*3/uL — AB (ref 0.9–3.3)
Lymphocytes Relative: 17 %
MCH: 30.9 pg (ref 25.1–34.0)
MCHC: 32.6 g/dL (ref 31.5–36.0)
MCV: 94.8 fL (ref 79.5–101.0)
MONOS PCT: 7 %
Monocytes Absolute: 0.2 10*3/uL (ref 0.1–0.9)
NEUTROS ABS: 1.9 10*3/uL (ref 1.5–6.5)
Neutrophils Relative %: 74 %
Platelets: 185 10*3/uL (ref 145–400)
RBC: 3.66 MIL/uL — ABNORMAL LOW (ref 3.70–5.45)
RDW: 12.7 % (ref 11.2–14.5)
WBC: 2.6 10*3/uL — ABNORMAL LOW (ref 3.9–10.3)

## 2018-01-22 MED ORDER — DEXAMETHASONE 4 MG PO TABS: ORAL_TABLET | ORAL | 1 refills | Status: DC

## 2018-01-22 MED ORDER — PROCHLORPERAZINE MALEATE 10 MG PO TABS: 10.0000 mg | ORAL_TABLET | Freq: Four times a day (QID) | ORAL | 1 refills | Status: AC | PRN

## 2018-01-22 MED ORDER — ONDANSETRON HCL 8 MG PO TABS: 8.0000 mg | ORAL_TABLET | Freq: Three times a day (TID) | ORAL | 1 refills | Status: AC | PRN

## 2018-01-22 NOTE — Assessment & Plan Note (Signed)
She is currently not symptomatic I plan to repeat imaging study after 3 cycles of treatment

## 2018-01-22 NOTE — Progress Notes (Signed)
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 The intent is of strictly palliative.  We discussed some of the risks, benefits, side-effects of carboplatin & Taxol. Treatment is intravenous, every 3 weeks x 6 cycles  Some of the short term side-effects included, though not limited to, including weight loss, life threatening infections, risk of allergic reactions, need for transfusions of blood products, nausea, vomiting, change in bowel habits, loss of hair, admission to hospital for various reasons, and risks of death.   Long term side-effects are also discussed including risks of infertility, permanent damage to nerve function, hearing loss, chronic fatigue, kidney damage with possibility needing hemodialysis, and rare secondary malignancy including bone marrow disorders.  The patient is aware that the response rates discussed earlier is not guaranteed.  After a long discussion, patient made an informed decision to proceed with the prescribed plan of care.   Patient education material was dispensed. We discussed premedication with dexamethasone before chemotherapy. Due to her chronic leukopenia and her age, I recommend G-CSF support   Pancytopenia, acquired [facility]) She has chronic pancytopenia We would proceed with treatment with G-CSF  support She is currently not symptomatic  Metastasis to lung [facility]) She is currently not symptomatic I plan to repeat imaging study after 3 cycles of treatment  Goals of care, counseling/discussion The patient is aware she has incurable disease and treatment is strictly palliative. We discussed importance of Advanced Directives and Living will. We discussed CODE STATUS; the patient desires to remain in full code.   Orders Placed This Encounter  Procedures  . CBC with Differential (Cancer Center Only)    Standing Status:   Standing    Number of Occurrences:   20    Standing Expiration Date:   01/23/2019  . CMP ([place] only)    Standing Status:   Standing    Number of Occurrences:   20    Standing Expiration Date:   01/23/2019    INTERVAL HISTORY: Please see below for problem oriented charting. She returns with her husband for further discussion about plan of care She is not symptomatic Denies cough, chest pain or shortness of breath Denies vaginal bleeding Her appetite is stable Denies recent infection No residual peripheral neuropathy from prior treatment  SUMMARY OF ONCOLOGIC HISTORY:   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[place])   07/04/2017 Initial Diagnosis    She presented to the GYN clinic with postmenopausal bleeding. Examination revealed abnormal cervix. Vaginal ultrasound showed a 10 cm uterus with an intramural fibroid, left ovary is slightly enlarged measuring 2.1 x 1.5 x 2.3 cm which is described as solid complex with a vascular flow. In addition the cervix is imaged showing a solid vascular mass measuring 2.7 x 2.1 x 2.2 cm. A sonohysterogram showed no abnormalities in the endometrium. Patient had a Pap smear showing high-grade squamous intraepithelial lesion with features suspicious for invasion as well as atypical glandular  cells. 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      08/29/2017 PET  scan    1. Cervical malignancy with maximum SUV 6.0, dominant cervical mass measuring 6.2 by 6.0 by 7.3 cm. 2. 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 3. There 12 scattered small pulmonary nodules measuring up to 5 mm in diameter. No hypermetabolic activity but these are below sensitive PET-CT size thresholds. These could be postinflammatory or less likely neoplastic, surveillance is recommended. 4. Hypodense thyroid nodules are not hypermetabolic which strongly favors benign etiology.      09/10/2017 Procedure    Placement of a subcutaneous port device.      09/12/2017 - 10/10/2017 Chemotherapy    The patient had weekly cisplatin for chemotherapy treatment.       09/12/2017 - 11/20/2017 Radiation Therapy    Radiation treatment dates:  External beam: 09/12/2017-10/29/2017, brachytherapy: 11/06/2017, 11/08/2017, 11/13/2017, 11/15/2017, 11/20/2017  Site/dose:   1. pelvis, 1.8 Gy in 25 fractions for a total dose of 45 Gy                      2. Sidewall Boost, 1.8 Gy in 5 fractions for a total dose of 9 Gy                      3. Nodal boost  2, 1.8 Gy in 2 fractions for a total dose of 3.6 Gy                      4. Cervix, 5.5 Gy (11/06/17), 6 Gy (11/08/17), 6 Gy (11/13/17), 5.5 Gy (11/15/17), 5.5 Gy (11/20/17)  Beams/energy:  1. 3D, 15X                              2. 3D, 15X                              3. 3D, 15X                              4. Brachytherapy via tandem-ring, [id number]-[id number]      12/27/2017 PET scan    1. Interval mixed response to therapy. Interval decrease in hypermetabolism associated with the cervical mass. Left pelvic sidewall hypermetabolic lymph node seen previously has resolved. 2. Interval progression of bilateral pulmonary nodules with demonstrable FDG accumulation in these nodules on today's study. Imaging features are compatible with metastatic disease. There is a new 3 mm nodule in the left upper lobe. 3. Focus of  FDG accumulation in the left adnexal region, likely related to the left ovary but tracks up along the left side of the uterus. This is indeterminate and may be related to the ovary although metastatic involvement is not excluded. Close attention on follow-up recommended. Pelvic MRI without and with contrast may possibly provide additional insight as clinically warranted.      01/21/2018 -  Chemotherapy    The patient had palonosetron (ALOXI) injection 0.25 mg, 0.25 mg, Intravenous,  Once, 0 of 4 cycles CARBOplatin (PARAPLATIN) 420 mg in sodium chloride 0.9 % 250 mL chemo infusion, 420 mg (100 % of original dose 417 mg), Intravenous,  Once, 0  of 4 cycles Dose modification:   (original dose 417 mg, Cycle 1) PACLitaxel (TAXOL) 312 mg in dextrose 5 % 500 mL chemo infusion (> 80mg /m2), 175 mg/m2 = 312 mg, Intravenous,  Once, 0 of 4 cycles  for chemotherapy treatment.        Metastasis to lung ([place])   01/15/2018 Initial Diagnosis    Metastasis to lung ([place])      01/21/2018 -  Chemotherapy    The patient had palonosetron (ALOXI) injection 0.25 mg, 0.25 mg, Intravenous,  Once, 0 of 4 cycles CARBOplatin (PARAPLATIN) 420 mg in sodium chloride 0.9 % 250 mL chemo infusion, 420 mg (100 % of original dose 417 mg), Intravenous,  Once, 0 of 4 cycles Dose modification:   (original dose 417 mg, Cycle 1) PACLitaxel (TAXOL) 312 mg in dextrose 5 % 500 mL chemo infusion (> 80mg /m2), 175 mg/m2 = 312 mg, Intravenous,  Once, 0 of 4 cycles  for chemotherapy treatment.        REVIEW OF SYSTEMS:   Constitutional: Denies fevers, chills or abnormal weight loss Eyes: Denies blurriness of vision Ears, nose, mouth, throat, and face: Denies mucositis or sore throat Respiratory: Denies cough, dyspnea or wheezes Cardiovascular: Denies palpitation, chest discomfort or lower extremity swelling Gastrointestinal:  Denies nausea, heartburn or change in bowel habits Skin: Denies abnormal skin rashes Lymphatics: Denies new  lymphadenopathy or easy bruising Neurological:Denies numbness, tingling or new weaknesses Behavioral/Psych: Mood is stable, no new changes  All other systems were reviewed with the patient and are negative.  I have reviewed the past medical history, past surgical history, social history and family history with the patient and they are unchanged from previous note.  ALLERGIES:  is allergic to tegaderm ag mesh [silver].  MEDICATIONS:  Current Outpatient Medications  Medication Sig Dispense Refill  . dexamethasone (DECADRON) 4 MG tablet Take 5 tabs the night before and 5 tabs in the morning of chemotherapy, every 3 weeks, with food 60 tablet 1  . lidocaine-prilocaine (EMLA) cream Apply to affected area once 30 g 3  . ondansetron (ZOFRAN) 8 MG tablet Take 1 tablet (8 mg total) by mouth every 8 (eight) hours as needed for refractory nausea / vomiting. Start on day 3 after chemo. 30 tablet 1  . prochlorperazine (COMPAZINE) 10 MG tablet Take 1 tablet (10 mg total) by mouth every 6 (six) hours as needed (Nausea or vomiting). 30 tablet 1   No current facility-administered medications for this visit.     PHYSICAL EXAMINATION: ECOG PERFORMANCE STATUS: 1 - Symptomatic but completely ambulatory  Vitals:   01/22/18 0955  BP: 125/62  Pulse: 77  Resp: 18  Temp: 98.1 F (36.7 C)  SpO2: 100%   Filed Weights   01/22/18 0955  Weight: 150 lb 12.8 oz (68.4 kg)    GENERAL:alert, no distress and comfortable SKIN: skin color, texture, turgor are normal, no rashes or significant lesions EYES: normal, Conjunctiva are pink and non-injected, sclera clear OROPHARYNX:no exudate, no erythema and lips, buccal mucosa, and tongue normal  NECK: supple, thyroid normal size, non-tender, without nodularity LYMPH:  no palpable lymphadenopathy in the cervical, axillary or inguinal LUNGS: clear to auscultation and percussion with normal breathing effort HEART: regular rate & rhythm and no murmurs and no lower  extremity edema ABDOMEN:abdomen soft, non-tender and normal bowel sounds Musculoskeletal:no cyanosis of digits and no clubbing  NEURO: alert & oriented x 3 with fluent speech, no focal motor/sensory deficits  LABORATORY DATA:  I have reviewed the data as  listed    Component Value Date/Time   NA 143 01/22/2018 0924   NA 141 10/15/2017 0900   K 3.7 01/22/2018 0924   K 4.2 10/15/2017 0900   CL 107 01/22/2018 0924   CO2 29 01/22/2018 0924   CO2 28 10/15/2017 0900   GLUCOSE 91 01/22/2018 0924   GLUCOSE 105 10/15/2017 0900   BUN 11 01/22/2018 0924   BUN 10.8 10/15/2017 0900   CREATININE 1.01 01/22/2018 0924   CREATININE 1.1 10/15/2017 0900   CALCIUM 9.7 01/22/2018 0924   CALCIUM 9.3 10/15/2017 0900   PROT 6.9 01/22/2018 0924   PROT 7.1 10/15/2017 0900   ALBUMIN 3.8 01/22/2018 0924   ALBUMIN 3.7 10/15/2017 0900   AST 17 01/22/2018 0924   AST 17 10/15/2017 0900   ALT 11 01/22/2018 0924   ALT 19 10/15/2017 0900   ALKPHOS 69 01/22/2018 0924   ALKPHOS 95 10/15/2017 0900   BILITOT 0.3 01/22/2018 0924   BILITOT 0.29 10/15/2017 0900   GFRNONAA 56 (L) 01/22/2018 0924   GFRAA >60 01/22/2018 0924    No results found for: SPEP, UPEP  Lab Results  Component Value Date   WBC 2.6 (L) 01/22/2018   NEUTROABS 1.9 01/22/2018   HGB 11.3 (L) 01/22/2018   HCT 34.7 (L) 01/22/2018   MCV 94.8 01/22/2018   PLT 185 01/22/2018      Chemistry      Component Value Date/Time   NA 143 01/22/2018 0924   NA 141 10/15/2017 0900   K 3.7 01/22/2018 0924   K 4.2 10/15/2017 0900   CL 107 01/22/2018 0924   CO2 29 01/22/2018 0924   CO2 28 10/15/2017 0900   BUN 11 01/22/2018 0924   BUN 10.8 10/15/2017 0900   CREATININE 1.01 01/22/2018 0924   CREATININE 1.1 10/15/2017 0900      Component Value Date/Time   CALCIUM 9.7 01/22/2018 0924   CALCIUM 9.3 10/15/2017 0900   ALKPHOS 69 01/22/2018 0924   ALKPHOS 95 10/15/2017 0900   AST 17 01/22/2018 0924   AST 17 10/15/2017 0900   ALT 11 01/22/2018  0924   ALT 19 10/15/2017 0900   BILITOT 0.3 01/22/2018 0924   BILITOT 0.29 10/15/2017 0900       RADIOGRAPHIC STUDIES: I have personally reviewed the radiological images as listed and agreed with the findings in the report. Nm Pet Image Restag (ps) Skull Base To Thigh  Result Date: 12/26/2017 CLINICAL DATA:  Subsequent treatment strategy for cervical cancer. EXAM: NUCLEAR MEDICINE PET SKULL BASE TO THIGH TECHNIQUE: 7.5 mCi F-18 FDG was injected intravenously. Full-ring PET imaging was performed from the skull base to thigh after the radiotracer. CT data was obtained and used for attenuation correction and anatomic localization. Fasting blood glucose: 86 mg/dl Mediastinal blood pool activity: SUV max 2.2 COMPARISON:  08/29/2017 FINDINGS: NECK: No hypermetabolic lymph nodes in the neck. Incidental CT findings: Similar appearance 3.6 cm right thyroid nodule without substantial hypermetabolism. Small nodules in the thyroid isthmus and left lobe are also stable. CHEST: Scattered bilateral pulmonary nodules are hypermetabolic and show interval progression on CT imaging. Index 6 mm nodule left upper lobe (image 50/series 4) demonstrates SUV max = 2.9 and measured 2 mm on the prior study. Index 6 mm left upper lobe nodule (image 50/4) demonstrates SUV max = 2.5 and measured 4 mm previously. A third index nodule measured on today's study at 6 mm in the anterior right upper lobe (image 67/4) was 3 mm previously and demonstrates SUV  max = 2.1 today. Other tiny nodules seen previously show similar mild interval enlargement. 3 mm left upper lobe nodule (image 61/4) appears to be new in the interval. Incidental CT findings: none ABDOMEN/PELVIS: Hypermetabolic left pelvic sidewall lymph node identified previously and measuring 1.5 cm in short axis on the previous exam has resolved. This measures 5 mm short axis today in shows no discernible hypermetabolism. There is FDG accumulation in the left adnexal space mapping to  a 3.2 x 1.7 cm soft tissue structure on image 166/4. This is likely the ovary in on CT imaging has a similar appearance to the prior exam. The FDG accumulation then tracks up along the left side of the uterus. A small focus of FDG accumulation is identified anterior to the left psoas muscle, in the region of the ureter. There is no abnormal soft tissue or lymph node at this location and the activity is felt to be related to excreted radiotracer in the ureter. No abnormal hypermetabolic activity within the liver, pancreas, adrenal glands, or spleen. No hypermetabolic lymph nodes in the abdomen or pelvis. Incidental CT findings: none SKELETON: No focal hypermetabolic activity to suggest skeletal metastasis. Incidental CT findings: none IMPRESSION: 1. Interval mixed response to therapy. Interval decrease in hypermetabolism associated with the cervical mass. Left pelvic sidewall hypermetabolic lymph node seen previously has resolved. 2. Interval progression of bilateral pulmonary nodules with demonstrable FDG accumulation in these nodules on today's study. Imaging features are compatible with metastatic disease. There is a new 3 mm nodule in the left upper lobe. 3. Focus of FDG accumulation in the left adnexal region, likely related to the left ovary but tracks up along the left side of the uterus. This is indeterminate and may be related to the ovary although metastatic involvement is not excluded. Close attention on follow-up recommended. Pelvic MRI without and with contrast may possibly provide additional insight as clinically warranted. Electronically Signed   By: Misty Stanley M.D.   On: 12/26/2017 14:22    All questions were answered. The patient knows to call the clinic with any problems, questions or concerns. No barriers to learning was detected.  I spent 25 minutes counseling the patient face to face. The total time spent in the appointment was 40 minutes and more than 50% was on counseling and review of  test results  Heath Lark, MD 01/22/2018 10:40 AM

## 2018-01-22 NOTE — Assessment & Plan Note (Signed)
She has chronic pancytopenia We would proceed with treatment with G-CSF support She is currently not symptomatic 

## 2018-01-22 NOTE — Assessment & Plan Note (Signed)
The patient is aware she has incurable disease and treatment is strictly palliative. We discussed importance of Advanced Directives and Living will. We discussed CODE STATUS; the patient desires to remain in full code. 

## 2018-01-22 NOTE — Assessment & Plan Note (Signed)
Unfortunately, additional testing for PD 1 was not possible due to the nature of her prior biopsy I do not recommend lung biopsy at this point I have reviewed this with GYN oncologist and our plan would be to proceed with palliative systemic chemotherapy We reviewed the NCCN guidelines We discussed the role of chemotherapy. The intent is of strictly palliative.  We discussed some of the risks, benefits, side-effects of carboplatin & Taxol. Treatment is intravenous, every 3 weeks x 6 cycles  Some of the short term side-effects included, though not limited to, including weight loss, life threatening infections, risk of allergic reactions, need for transfusions of blood products, nausea, vomiting, change in bowel habits, loss of hair, admission to hospital for various reasons, and risks of death.   Long term side-effects are also discussed including risks of infertility, permanent damage to nerve function, hearing loss, chronic fatigue, kidney damage with possibility needing hemodialysis, and rare secondary malignancy including bone marrow disorders.  The patient is aware that the response rates discussed earlier is not guaranteed.  After a long discussion, patient made an informed decision to proceed with the prescribed plan of care.   Patient education material was dispensed. We discussed premedication with dexamethasone before chemotherapy. Due to her chronic leukopenia and her age, I recommend G-CSF support

## 2018-01-23 ENCOUNTER — Encounter: Payer: Self-pay | Admitting: Hematology and Oncology

## 2018-01-24 ENCOUNTER — Inpatient Hospital Stay: Payer: Medicare HMO

## 2018-01-24 VITALS — BP 129/67 | HR 68 | Temp 98.8°F | Resp 16 | Ht 65.0 in | Wt 150.5 lb

## 2018-01-24 DIAGNOSIS — C7800 Secondary malignant neoplasm of unspecified lung: Secondary | ICD-10-CM

## 2018-01-24 DIAGNOSIS — Z5111 Encounter for antineoplastic chemotherapy: Secondary | ICD-10-CM | POA: Diagnosis not present

## 2018-01-24 DIAGNOSIS — C539 Malignant neoplasm of cervix uteri, unspecified: Secondary | ICD-10-CM

## 2018-01-24 MED ORDER — PALONOSETRON HCL INJECTION 0.25 MG/5ML
INTRAVENOUS | Status: AC
  Filled 2018-01-24: qty 5

## 2018-01-24 MED ORDER — DIPHENHYDRAMINE HCL 50 MG/ML IJ SOLN
INTRAMUSCULAR | Status: AC
  Filled 2018-01-24: qty 1

## 2018-01-24 MED ORDER — FAMOTIDINE IN NACL 20-0.9 MG/50ML-% IV SOLN
20.0000 mg | Freq: Once | INTRAVENOUS | Status: AC
  Administered 2018-01-24: 20 mg via INTRAVENOUS

## 2018-01-24 MED ORDER — SODIUM CHLORIDE 0.9% FLUSH
10.0000 mL | INTRAVENOUS | Status: DC | PRN
  Administered 2018-01-24: 10 mL
  Filled 2018-01-24: qty 10

## 2018-01-24 MED ORDER — DIPHENHYDRAMINE HCL 50 MG/ML IJ SOLN
50.0000 mg | Freq: Once | INTRAMUSCULAR | Status: AC
  Administered 2018-01-24: 50 mg via INTRAVENOUS

## 2018-01-24 MED ORDER — SODIUM CHLORIDE 0.9 % IV SOLN
20.0000 mg | Freq: Once | INTRAVENOUS | Status: AC
  Administered 2018-01-24: 20 mg via INTRAVENOUS
  Filled 2018-01-24: qty 2

## 2018-01-24 MED ORDER — SODIUM CHLORIDE 0.9 % IV SOLN
417.0000 mg | Freq: Once | INTRAVENOUS | Status: AC
  Administered 2018-01-24: 420 mg via INTRAVENOUS
  Filled 2018-01-24: qty 42

## 2018-01-24 MED ORDER — SODIUM CHLORIDE 0.9 % IV SOLN
175.0000 mg/m2 | Freq: Once | INTRAVENOUS | Status: AC
  Administered 2018-01-24: 312 mg via INTRAVENOUS
  Filled 2018-01-24: qty 52

## 2018-01-24 MED ORDER — PALONOSETRON HCL INJECTION 0.25 MG/5ML
0.2500 mg | Freq: Once | INTRAVENOUS | Status: AC
  Administered 2018-01-24: 0.25 mg via INTRAVENOUS

## 2018-01-24 MED ORDER — SODIUM CHLORIDE 0.9 % IV SOLN
Freq: Once | INTRAVENOUS | Status: AC
  Administered 2018-01-24: 09:00:00 via INTRAVENOUS

## 2018-01-24 MED ORDER — HEPARIN SOD (PORK) LOCK FLUSH 100 UNIT/ML IV SOLN
500.0000 [IU] | Freq: Once | INTRAVENOUS | Status: AC | PRN
  Administered 2018-01-24: 500 [IU]
  Filled 2018-01-24: qty 5

## 2018-01-24 MED ORDER — FAMOTIDINE IN NACL 20-0.9 MG/50ML-% IV SOLN
INTRAVENOUS | Status: AC
  Filled 2018-01-24: qty 50

## 2018-01-24 NOTE — Patient Instructions (Signed)
[facility] Discharge Instructions for Patients Receiving Chemotherapy  Today you received the following chemotherapy agents:  Taxol, Carboplatin  To help prevent nausea and vomiting after your treatment, we encourage you to take your nausea medication as prescribed.   If you develop nausea and vomiting that is not controlled by your nausea medication, call the clinic.   BELOW ARE SYMPTOMS THAT SHOULD BE REPORTED IMMEDIATELY:  *FEVER GREATER THAN 100.5 F  *CHILLS WITH OR WITHOUT FEVER  NAUSEA AND VOMITING THAT IS NOT CONTROLLED WITH YOUR NAUSEA MEDICATION  *UNUSUAL SHORTNESS OF BREATH  *UNUSUAL BRUISING OR BLEEDING  TENDERNESS IN MOUTH AND THROAT WITH OR WITHOUT PRESENCE OF ULCERS  *URINARY PROBLEMS  *BOWEL PROBLEMS  UNUSUAL RASH Items with * indicate a potential emergency and should be followed up as soon as possible.  Feel free to call the clinic should you have any questions or concerns. The clinic phone number is [phone].  Please show the CHEMO ALERT CARD at check-in to the Emergency Department and triage nurse.   

## 2018-01-26 ENCOUNTER — Inpatient Hospital Stay: Payer: Medicare HMO

## 2018-01-26 DIAGNOSIS — C530 Malignant neoplasm of endocervix: Secondary | ICD-10-CM

## 2018-01-26 DIAGNOSIS — Z5111 Encounter for antineoplastic chemotherapy: Secondary | ICD-10-CM | POA: Diagnosis not present

## 2018-01-26 MED ORDER — PEGFILGRASTIM-CBQV 6 MG/0.6ML ~~LOC~~ SOSY
6.0000 mg | PREFILLED_SYRINGE | Freq: Once | SUBCUTANEOUS | Status: AC
  Administered 2018-01-26: 6 mg via SUBCUTANEOUS

## 2018-01-26 MED ORDER — PEGFILGRASTIM-CBQV 6 MG/0.6ML ~~LOC~~ SOSY
PREFILLED_SYRINGE | SUBCUTANEOUS | Status: AC
  Filled 2018-01-26: qty 0.6

## 2018-01-26 NOTE — Patient Instructions (Signed)
Pegfilgrastim injection What is this medicine? PEGFILGRASTIM (PEG fil gra stim) is a long-acting granulocyte colony-stimulating factor that stimulates the growth of neutrophils, a type of white blood cell important in the body's fight against infection. It is used to reduce the incidence of fever and infection in patients with certain types of cancer who are receiving chemotherapy that affects the bone marrow, and to increase survival after being exposed to high doses of radiation. This medicine may be used for other purposes; ask your health care provider or pharmacist if you have questions. COMMON BRAND NAME(S): Neulasta What should I tell my health care provider before I take this medicine? They need to know if you have any of these conditions: -kidney disease -latex allergy -ongoing radiation therapy -sickle cell disease -skin reactions to acrylic adhesives (On-Body Injector only) -an unusual or allergic reaction to pegfilgrastim, filgrastim, other medicines, foods, dyes, or preservatives -pregnant or trying to get pregnant -breast-feeding How should I use this medicine? This medicine is for injection under the skin. If you get this medicine at home, you will be taught how to prepare and give the pre-filled syringe or how to use the On-body Injector. Refer to the patient Instructions for Use for detailed instructions. Use exactly as directed. Tell your healthcare provider immediately if you suspect that the On-body Injector may not have performed as intended or if you suspect the use of the On-body Injector resulted in a missed or partial dose. It is important that you put your used needles and syringes in a special sharps container. Do not put them in a trash can. If you do not have a sharps container, call your pharmacist or healthcare provider to get one. Talk to your pediatrician regarding the use of this medicine in children. While this drug may be prescribed for selected conditions,  precautions do apply. Overdosage: If you think you have taken too much of this medicine contact a poison control center or emergency room at once. NOTE: This medicine is only for you. Do not share this medicine with others. What if I miss a dose? It is important not to miss your dose. Call your doctor or health care professional if you miss your dose. If you miss a dose due to an On-body Injector failure or leakage, a new dose should be administered as soon as possible using a single prefilled syringe for manual use. What may interact with this medicine? Interactions have not been studied. Give your health care provider a list of all the medicines, herbs, non-prescription drugs, or dietary supplements you use. Also tell them if you smoke, drink alcohol, or use illegal drugs. Some items may interact with your medicine. This list may not describe all possible interactions. Give your health care provider a list of all the medicines, herbs, non-prescription drugs, or dietary supplements you use. Also tell them if you smoke, drink alcohol, or use illegal drugs. Some items may interact with your medicine. What should I watch for while using this medicine? You may need blood work done while you are taking this medicine. If you are going to need a MRI, CT scan, or other procedure, tell your doctor that you are using this medicine (On-Body Injector only). What side effects may I notice from receiving this medicine? Side effects that you should report to your doctor or health care professional as soon as possible: -allergic reactions like skin rash, itching or hives, swelling of the face, lips, or tongue -dizziness -fever -pain, redness, or irritation at site   where injected -pinpoint red spots on the skin -red or dark-brown urine -shortness of breath or breathing problems -stomach or side pain, or pain at the shoulder -swelling -tiredness -trouble passing urine or change in the amount of urine Side  effects that usually do not require medical attention (report to your doctor or health care professional if they continue or are bothersome): -bone pain -muscle pain This list may not describe all possible side effects. Call your doctor for medical advice about side effects. You may report side effects to FDA at 1-800-FDA-1088. Where should I keep my medicine? Keep out of the reach of children. Store pre-filled syringes in a refrigerator between 2 and 8 degrees C (36 and 46 degrees F). Do not freeze. Keep in carton to protect from light. Throw away this medicine if it is left out of the refrigerator for more than 48 hours. Throw away any unused medicine after the expiration date. NOTE: This sheet is a summary. It may not cover all possible information. If you have questions about this medicine, talk to your doctor, pharmacist, or health care provider.  2018 Elsevier/Gold Standard (2016-09-28 12:58:03)  

## 2018-02-14 ENCOUNTER — Inpatient Hospital Stay (HOSPITAL_BASED_OUTPATIENT_CLINIC_OR_DEPARTMENT_OTHER): Payer: Medicare HMO | Admitting: Hematology and Oncology

## 2018-02-14 ENCOUNTER — Telehealth: Payer: Self-pay | Admitting: Hematology and Oncology

## 2018-02-14 ENCOUNTER — Encounter: Payer: Self-pay | Admitting: Hematology and Oncology

## 2018-02-14 ENCOUNTER — Inpatient Hospital Stay: Payer: Medicare HMO

## 2018-02-14 ENCOUNTER — Inpatient Hospital Stay: Payer: Medicare HMO | Attending: Hematology and Oncology

## 2018-02-14 DIAGNOSIS — D61818 Other pancytopenia: Secondary | ICD-10-CM | POA: Diagnosis not present

## 2018-02-14 DIAGNOSIS — C539 Malignant neoplasm of cervix uteri, unspecified: Secondary | ICD-10-CM

## 2018-02-14 DIAGNOSIS — Z79899 Other long term (current) drug therapy: Secondary | ICD-10-CM | POA: Diagnosis not present

## 2018-02-14 DIAGNOSIS — T451X5A Adverse effect of antineoplastic and immunosuppressive drugs, initial encounter: Secondary | ICD-10-CM

## 2018-02-14 DIAGNOSIS — C530 Malignant neoplasm of endocervix: Secondary | ICD-10-CM

## 2018-02-14 DIAGNOSIS — C7800 Secondary malignant neoplasm of unspecified lung: Secondary | ICD-10-CM

## 2018-02-14 DIAGNOSIS — G620 Drug-induced polyneuropathy: Secondary | ICD-10-CM | POA: Insufficient documentation

## 2018-02-14 DIAGNOSIS — G629 Polyneuropathy, unspecified: Secondary | ICD-10-CM | POA: Insufficient documentation

## 2018-02-14 DIAGNOSIS — Z923 Personal history of irradiation: Secondary | ICD-10-CM

## 2018-02-14 DIAGNOSIS — Z5111 Encounter for antineoplastic chemotherapy: Secondary | ICD-10-CM | POA: Insufficient documentation

## 2018-02-14 LAB — CBC WITH DIFFERENTIAL (CANCER CENTER ONLY)
Basophils Absolute: 0 10*3/uL (ref 0.0–0.1)
Basophils Relative: 0 %
Eosinophils Absolute: 0 10*3/uL (ref 0.0–0.5)
Eosinophils Relative: 0 %
HCT: 33.7 % — ABNORMAL LOW (ref 34.8–46.6)
HEMOGLOBIN: 11.3 g/dL — AB (ref 11.6–15.9)
LYMPHS ABS: 0.2 10*3/uL — AB (ref 0.9–3.3)
LYMPHS PCT: 7 %
MCH: 30.8 pg (ref 25.1–34.0)
MCHC: 33.6 g/dL (ref 31.5–36.0)
MCV: 91.6 fL (ref 79.5–101.0)
MONOS PCT: 1 %
Monocytes Absolute: 0 10*3/uL — ABNORMAL LOW (ref 0.1–0.9)
NEUTROS ABS: 2.1 10*3/uL (ref 1.5–6.5)
NEUTROS PCT: 92 %
Platelet Count: 164 10*3/uL (ref 145–400)
RBC: 3.68 MIL/uL — ABNORMAL LOW (ref 3.70–5.45)
RDW: 13.3 % (ref 11.2–14.5)
WBC Count: 2.3 10*3/uL — ABNORMAL LOW (ref 3.9–10.3)

## 2018-02-14 LAB — CMP (CANCER CENTER ONLY)
ALT: 16 U/L (ref 0–55)
ANION GAP: 9 (ref 3–11)
AST: 18 U/L (ref 5–34)
Albumin: 4.1 g/dL (ref 3.5–5.0)
Alkaline Phosphatase: 86 U/L (ref 40–150)
BUN: 16 mg/dL (ref 7–26)
CHLORIDE: 106 mmol/L (ref 98–109)
CO2: 24 mmol/L (ref 22–29)
CREATININE: 0.96 mg/dL (ref 0.60–1.10)
Calcium: 10 mg/dL (ref 8.4–10.4)
GFR, Estimated: 60 mL/min — ABNORMAL LOW (ref >60)
Glucose, Bld: 161 mg/dL — ABNORMAL HIGH (ref 70–140)
POTASSIUM: 4 mmol/L (ref 3.5–5.1)
SODIUM: 139 mmol/L (ref 136–145)
Total Bilirubin: 0.2 mg/dL (ref 0.2–1.2)
Total Protein: 7.4 g/dL (ref 6.4–8.3)

## 2018-02-14 MED ORDER — FAMOTIDINE IN NACL 20-0.9 MG/50ML-% IV SOLN
INTRAVENOUS | Status: AC
  Filled 2018-02-14: qty 50

## 2018-02-14 MED ORDER — DIPHENHYDRAMINE HCL 50 MG/ML IJ SOLN
INTRAMUSCULAR | Status: AC
  Filled 2018-02-14: qty 1

## 2018-02-14 MED ORDER — PALONOSETRON HCL INJECTION 0.25 MG/5ML
INTRAVENOUS | Status: AC
  Filled 2018-02-14: qty 5

## 2018-02-14 MED ORDER — SODIUM CHLORIDE 0.9 % IV SOLN
420.0000 mg | Freq: Once | INTRAVENOUS | Status: AC
  Administered 2018-02-14: 420 mg via INTRAVENOUS
  Filled 2018-02-14: qty 42

## 2018-02-14 MED ORDER — SODIUM CHLORIDE 0.9% FLUSH
10.0000 mL | INTRAVENOUS | Status: DC | PRN
  Administered 2018-02-14: 10 mL
  Filled 2018-02-14: qty 10

## 2018-02-14 MED ORDER — HEPARIN SOD (PORK) LOCK FLUSH 100 UNIT/ML IV SOLN
500.0000 [IU] | Freq: Once | INTRAVENOUS | Status: AC | PRN
  Administered 2018-02-14: 500 [IU]
  Filled 2018-02-14: qty 5

## 2018-02-14 MED ORDER — PALONOSETRON HCL INJECTION 0.25 MG/5ML
0.2500 mg | Freq: Once | INTRAVENOUS | Status: AC
  Administered 2018-02-14: 0.25 mg via INTRAVENOUS

## 2018-02-14 MED ORDER — FAMOTIDINE IN NACL 20-0.9 MG/50ML-% IV SOLN
20.0000 mg | Freq: Once | INTRAVENOUS | Status: AC
  Administered 2018-02-14: 20 mg via INTRAVENOUS

## 2018-02-14 MED ORDER — SODIUM CHLORIDE 0.9 % IV SOLN
Freq: Once | INTRAVENOUS | Status: AC
  Administered 2018-02-14: 09:00:00 via INTRAVENOUS

## 2018-02-14 MED ORDER — DIPHENHYDRAMINE HCL 50 MG/ML IJ SOLN
50.0000 mg | Freq: Once | INTRAMUSCULAR | Status: AC
  Administered 2018-02-14: 50 mg via INTRAVENOUS

## 2018-02-14 MED ORDER — SODIUM CHLORIDE 0.9 % IV SOLN
20.0000 mg | Freq: Once | INTRAVENOUS | Status: AC
  Administered 2018-02-14: 20 mg via INTRAVENOUS
  Filled 2018-02-14: qty 2

## 2018-02-14 MED ORDER — SODIUM CHLORIDE 0.9% FLUSH
10.0000 mL | Freq: Once | INTRAVENOUS | Status: AC
  Administered 2018-02-14: 10 mL
  Filled 2018-02-14: qty 10

## 2018-02-14 MED ORDER — PACLITAXEL CHEMO INJECTION 300 MG/50ML
175.0000 mg/m2 | Freq: Once | INTRAVENOUS | Status: AC
  Administered 2018-02-14: 312 mg via INTRAVENOUS
  Filled 2018-02-14: qty 52

## 2018-02-14 NOTE — Assessment & Plan Note (Signed)
She has chronic pancytopenia We would proceed with treatment with G-CSF support She is currently not symptomatic 

## 2018-02-14 NOTE — Patient Instructions (Signed)
[facility] Discharge Instructions for Patients Receiving Chemotherapy  Today you received the following chemotherapy agents:  Taxol, Carboplatin  To help prevent nausea and vomiting after your treatment, we encourage you to take your nausea medication as prescribed.   If you develop nausea and vomiting that is not controlled by your nausea medication, call the clinic.   BELOW ARE SYMPTOMS THAT SHOULD BE REPORTED IMMEDIATELY:  *FEVER GREATER THAN 100.5 F  *CHILLS WITH OR WITHOUT FEVER  NAUSEA AND VOMITING THAT IS NOT CONTROLLED WITH YOUR NAUSEA MEDICATION  *UNUSUAL SHORTNESS OF BREATH  *UNUSUAL BRUISING OR BLEEDING  TENDERNESS IN MOUTH AND THROAT WITH OR WITHOUT PRESENCE OF ULCERS  *URINARY PROBLEMS  *BOWEL PROBLEMS  UNUSUAL RASH Items with * indicate a potential emergency and should be followed up as soon as possible.  Feel free to call the clinic should you have any questions or concerns. The clinic phone number is [phone].  Please show the CHEMO ALERT CARD at check-in to the Emergency Department and triage nurse.   

## 2018-02-14 NOTE — Progress Notes (Signed)
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[facility]) Unfortunately, additional testing for PD 1 was not possible due to the nature of her prior biopsy I do not recommend lung biopsy at this point I have reviewed this with GYN oncologist and our plan would be to proceed with palliative systemic chemotherapy So far, she tolerated chemo well except for mild pancytopenia and peripheral neuropathy We will proceed with minimum 3 cycles of treatment before repeat imaging study  Metastasis to lung [facility]) She is currently not symptomatic I plan to repeat imaging study after 3 cycles of treatment  Pancytopenia, acquired ([place]) She has chronic pancytopenia We would proceed with treatment with G-CSF support She is currently not symptomatic  Peripheral neuropathy due to chemotherapy [facility]) she has mild peripheral neuropathy, likely related to side effects of treatment. It is only mild, not bothering the patient. I will observe for now If it gets worse in the future, I will consider modifying the dose of the treatment    Orders Placed This Encounter  Procedures  . NM PET Image Restag (PS) Skull Base To Thigh    Standing Status:   Future    Standing Expiration Date:   02/15/2019    Order Specific Question:   If indicated for the ordered procedure, I authorize the administration of a radiopharmaceutical per Radiology protocol    Answer:   Yes    Order Specific Question:   Preferred imaging location?    Answer:   [facility]    Order Specific Question:   Radiology Contrast Protocol - do NOT remove file path    Answer:   [email or web address]     INTERVAL HISTORY: Please see below for problem oriented charting. She returns for cycle 2 of chemotherapy She tolerated treatment well except for mild intermittent peripheral neuropathy affecting the tip of her toes No recent nausea or vomiting She had minor constipation, resolved with laxatives Denies recent cough, chest pain or shortness of breath No recent infection  SUMMARY OF ONCOLOGIC HISTORY:   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[place])   07/04/2017 Initial Diagnosis    She presented to the GYN clinic with postmenopausal bleeding. Examination revealed abnormal cervix. Vaginal ultrasound showed a 10 cm uterus with an intramural fibroid, left ovary is slightly enlarged measuring 2.1 x 1.5 x 2.3 cm which is described as solid complex with a vascular flow. In addition the cervix is imaged showing a solid vascular mass measuring 2.7 x 2.1 x 2.2 cm. A sonohysterogram showed no abnormalities in the endometrium. Patient had a Pap smear showing high-grade squamous intraepithelial lesion with features suspicious for invasion as well as atypical glandular cells. 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      08/29/2017 PET scan    1. Cervical malignancy with maximum SUV 6.0, dominant cervical mass measuring 6.2 by 6.0 by 7.3 cm. 2. 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 3. There 12 scattered small pulmonary nodules measuring up to 5 mm in diameter. No hypermetabolic activity but these are below sensitive PET-CT size thresholds. These could be postinflammatory or less likely neoplastic, surveillance is recommended.  4. Hypodense thyroid nodules are not hypermetabolic which strongly favors benign etiology.      09/10/2017 Procedure    Placement of a subcutaneous port device.      09/12/2017 - 10/10/2017 Chemotherapy    The patient had weekly cisplatin for  chemotherapy treatment.       09/12/2017 - 11/20/2017 Radiation Therapy    Radiation treatment dates:  External beam: 09/12/2017-10/29/2017, brachytherapy: 11/06/2017, 11/08/2017, 11/13/2017, 11/15/2017, 11/20/2017  Site/dose:   1. pelvis, 1.8 Gy in 25 fractions for a total dose of 45 Gy                      2. Sidewall Boost, 1.8 Gy in 5 fractions for a total dose of 9 Gy                      3. Nodal boost  2, 1.8 Gy in 2 fractions for a total dose of 3.6 Gy                      4. Cervix, 5.5 Gy (11/06/17), 6 Gy (11/08/17), 6 Gy (11/13/17), 5.5 Gy (11/15/17), 5.5 Gy (11/20/17)  Beams/energy:  1. 3D, 15X                              2. 3D, 15X                              3. 3D, 15X                              4. Brachytherapy via tandem-ring, [id number]      12/27/2017 PET scan    1. Interval mixed response to therapy. Interval decrease in hypermetabolism associated with the cervical mass. Left pelvic sidewall hypermetabolic lymph node seen previously has resolved. 2. Interval progression of bilateral pulmonary nodules with demonstrable FDG accumulation in these nodules on today's study. Imaging features are compatible with metastatic disease. There is a new 3 mm nodule in the left upper lobe. 3. Focus of FDG accumulation in the left adnexal region, likely related to the left ovary but tracks up along the left side of the uterus. This is indeterminate and may be related to the ovary although metastatic involvement is not excluded. Close attention on follow-up recommended. Pelvic MRI without and with contrast may possibly provide additional insight as clinically warranted.      01/21/2018 -  Chemotherapy    The patient had palonosetron (ALOXI) injection 0.25 mg, 0.25 mg, Intravenous,  Once, 2 of 4 cycles Administration: 0.25 mg (01/24/2018) CARBOplatin (PARAPLATIN) 420 mg in sodium chloride 0.9 % 250 mL chemo infusion, 420 mg (100 % of original dose 417 mg), Intravenous,  Once, 2 of 4 cycles Dose  modification:   (original dose 417 mg, Cycle 1), 420 mg (original dose 417 mg, Cycle 2) Administration: 420 mg (01/24/2018) PACLitaxel (TAXOL) 312 mg in sodium chloride 0.9 % 500 mL chemo infusion (> 80mg /m2), 175 mg/m2 = 312 mg, Intravenous,  Once, 2 of 4 cycles Administration: 312 mg (01/24/2018)  for chemotherapy treatment.        Metastasis to lung ([place])   01/15/2018 Initial Diagnosis    Metastasis to lung [facility])      01/21/2018 -  Chemotherapy    The patient  had palonosetron (ALOXI) injection 0.25 mg, 0.25 mg, Intravenous,  Once, 2 of 4 cycles Administration: 0.25 mg (01/24/2018) CARBOplatin (PARAPLATIN) 420 mg in sodium chloride 0.9 % 250 mL chemo infusion, 420 mg (100 % of original dose 417 mg), Intravenous,  Once, 2 of 4 cycles Dose modification:   (original dose 417 mg, Cycle 1), 420 mg (original dose 417 mg, Cycle 2) Administration: 420 mg (01/24/2018) PACLitaxel (TAXOL) 312 mg in sodium chloride 0.9 % 500 mL chemo infusion (> 80mg /m2), 175 mg/m2 = 312 mg, Intravenous,  Once, 2 of 4 cycles Administration: 312 mg (01/24/2018)  for chemotherapy treatment.        REVIEW OF SYSTEMS:   Constitutional: Denies fevers, chills or abnormal weight loss Eyes: Denies blurriness of vision Ears, nose, mouth, throat, and face: Denies mucositis or sore throat Respiratory: Denies cough, dyspnea or wheezes Cardiovascular: Denies palpitation, chest discomfort or lower extremity swelling Gastrointestinal:  Denies nausea, heartburn or change in bowel habits Skin: Denies abnormal skin rashes Lymphatics: Denies new lymphadenopathy or easy bruising Neurological:Denies numbness, tingling or new weaknesses Behavioral/Psych: Mood is stable, no new changes  All other systems were reviewed with the patient and are negative.  I have reviewed the past medical history, past surgical history, social history and family history with the patient and they are unchanged from previous note.  ALLERGIES:  is allergic  to tegaderm ag mesh [silver].  MEDICATIONS:  Current Outpatient Medications  Medication Sig Dispense Refill  . dexamethasone (DECADRON) 4 MG tablet Take 5 tabs the night before and 5 tabs in the morning of chemotherapy, every 3 weeks, with food 60 tablet 1  . lidocaine-prilocaine (EMLA) cream Apply to affected area once 30 g 3  . ondansetron (ZOFRAN) 8 MG tablet Take 1 tablet (8 mg total) by mouth every 8 (eight) hours as needed for refractory nausea / vomiting. Start on day 3 after chemo. 30 tablet 1  . prochlorperazine (COMPAZINE) 10 MG tablet Take 1 tablet (10 mg total) by mouth every 6 (six) hours as needed (Nausea or vomiting). 30 tablet 1   No current facility-administered medications for this visit.    Facility-Administered Medications Ordered in Other Visits  Medication Dose Route Frequency Provider Last Rate Last Dose  . CARBOplatin (PARAPLATIN) 420 mg in sodium chloride 0.9 % 250 mL chemo infusion  420 mg Intravenous Once Alvy Bimler, Zamier Eggebrecht, MD      . heparin lock flush 100 unit/mL  500 Units Intracatheter Once PRN Alvy Bimler, Vianna Venezia, MD      . PACLitaxel (TAXOL) 312 mg in sodium chloride 0.9 % 500 mL chemo infusion (> 80mg /m2)  175 mg/m2 (Treatment Plan Recorded) Intravenous Once Heath Lark, MD 184 mL/hr at 02/14/18 1059 312 mg at 02/14/18 1059  . sodium chloride flush (NS) 0.9 % injection 10 mL  10 mL Intracatheter PRN Alvy Bimler, Alaira Level, MD        PHYSICAL EXAMINATION: ECOG PERFORMANCE STATUS: 0 - Asymptomatic  Vitals:   02/14/18 0849  BP: 135/77  Pulse: 92  Resp: 18  Temp: 98.2 F (36.8 C)  SpO2: 100%   Filed Weights   02/14/18 0849  Weight: 147 lb 11.2 oz (67 kg)    GENERAL:alert, no distress and comfortable SKIN: skin color, texture, turgor are normal, no rashes or significant lesions EYES: normal, Conjunctiva are pink and non-injected, sclera clear OROPHARYNX:no exudate, no erythema and lips, buccal mucosa, and tongue normal  NECK: supple, thyroid normal size, non-tender,  without nodularity LYMPH:  no palpable lymphadenopathy in the cervical,  axillary or inguinal LUNGS: clear to auscultation and percussion with normal breathing effort HEART: regular rate & rhythm and no murmurs and no lower extremity edema ABDOMEN:abdomen soft, non-tender and normal bowel sounds Musculoskeletal:no cyanosis of digits and no clubbing  NEURO: alert & oriented x 3 with fluent speech, no focal motor/sensory deficits  LABORATORY DATA:  I have reviewed the data as listed    Component Value Date/Time   NA 139 02/14/2018 0833   NA 141 10/15/2017 0900   K 4.0 02/14/2018 0833   K 4.2 10/15/2017 0900   CL 106 02/14/2018 0833   CO2 24 02/14/2018 0833   CO2 28 10/15/2017 0900   GLUCOSE 161 (H) 02/14/2018 0833   GLUCOSE 105 10/15/2017 0900   BUN 16 02/14/2018 0833   BUN 10.8 10/15/2017 0900   CREATININE 0.96 02/14/2018 0833   CREATININE 1.1 10/15/2017 0900   CALCIUM 10.0 02/14/2018 0833   CALCIUM 9.3 10/15/2017 0900   PROT 7.4 02/14/2018 0833   PROT 7.1 10/15/2017 0900   ALBUMIN 4.1 02/14/2018 0833   ALBUMIN 3.7 10/15/2017 0900   AST 18 02/14/2018 0833   AST 17 10/15/2017 0900   ALT 16 02/14/2018 0833   ALT 19 10/15/2017 0900   ALKPHOS 86 02/14/2018 0833   ALKPHOS 95 10/15/2017 0900   BILITOT 0.2 02/14/2018 0833   BILITOT 0.29 10/15/2017 0900   GFRNONAA 60 (L) 02/14/2018 0833   GFRAA >60 02/14/2018 0833    No results found for: SPEP, UPEP  Lab Results  Component Value Date   WBC 2.3 (L) 02/14/2018   NEUTROABS 2.1 02/14/2018   HGB 11.3 (L) 02/14/2018   HCT 33.7 (L) 02/14/2018   MCV 91.6 02/14/2018   PLT 164 02/14/2018      Chemistry      Component Value Date/Time   NA 139 02/14/2018 0833   NA 141 10/15/2017 0900   K 4.0 02/14/2018 0833   K 4.2 10/15/2017 0900   CL 106 02/14/2018 0833   CO2 24 02/14/2018 0833   CO2 28 10/15/2017 0900   BUN 16 02/14/2018 0833   BUN 10.8 10/15/2017 0900   CREATININE 0.96 02/14/2018 0833   CREATININE 1.1 10/15/2017 0900       Component Value Date/Time   CALCIUM 10.0 02/14/2018 0833   CALCIUM 9.3 10/15/2017 0900   ALKPHOS 86 02/14/2018 0833   ALKPHOS 95 10/15/2017 0900   AST 18 02/14/2018 0833   AST 17 10/15/2017 0900   ALT 16 02/14/2018 0833   ALT 19 10/15/2017 0900   BILITOT 0.2 02/14/2018 0833   BILITOT 0.29 10/15/2017 0900      All questions were answered. The patient knows to call the clinic with any problems, questions or concerns. No barriers to learning was detected.  I spent 15 minutes counseling the patient face to face. The total time spent in the appointment was 20 minutes and more than 50% was on counseling and review of test results  Heath Lark, MD 02/14/2018 12:24 PM

## 2018-02-14 NOTE — Assessment & Plan Note (Signed)
She is currently not symptomatic I plan to repeat imaging study after 3 cycles of treatment

## 2018-02-14 NOTE — Assessment & Plan Note (Signed)
Unfortunately, additional testing for PD 1 was not possible due to the nature of her prior biopsy I do not recommend lung biopsy at this point I have reviewed this with GYN oncologist and our plan would be to proceed with palliative systemic chemotherapy So far, she tolerated chemo well except for mild pancytopenia and peripheral neuropathy We will proceed with minimum 3 cycles of treatment before repeat imaging study

## 2018-02-14 NOTE — Assessment & Plan Note (Signed)
she has mild peripheral neuropathy, likely related to side effects of treatment. It is only mild, not bothering the patient. I will observe for now If it gets worse in the future, I will consider modifying the dose of the treatment  

## 2018-02-14 NOTE — Telephone Encounter (Signed)
Gave avs and calendar ° °

## 2018-02-16 ENCOUNTER — Inpatient Hospital Stay: Payer: Medicare HMO

## 2018-02-16 VITALS — BP 138/66 | HR 97 | Temp 97.8°F | Resp 18

## 2018-02-16 DIAGNOSIS — Z5111 Encounter for antineoplastic chemotherapy: Secondary | ICD-10-CM | POA: Diagnosis not present

## 2018-02-16 DIAGNOSIS — C530 Malignant neoplasm of endocervix: Secondary | ICD-10-CM

## 2018-02-16 MED ORDER — PEGFILGRASTIM-CBQV 6 MG/0.6ML ~~LOC~~ SOSY
PREFILLED_SYRINGE | SUBCUTANEOUS | Status: AC
Start: 2018-02-16 — End: ?
  Filled 2018-02-16: qty 0.6

## 2018-02-16 MED ORDER — PEGFILGRASTIM-CBQV 6 MG/0.6ML ~~LOC~~ SOSY
6.0000 mg | PREFILLED_SYRINGE | Freq: Once | SUBCUTANEOUS | Status: AC
  Administered 2018-02-16: 6 mg via SUBCUTANEOUS

## 2018-02-20 ENCOUNTER — Telehealth: Payer: Self-pay | Admitting: *Deleted

## 2018-02-20 ENCOUNTER — Telehealth: Payer: Self-pay

## 2018-02-20 NOTE — Telephone Encounter (Signed)
Returned pt's call regarding "bowel problems'.  Pt reports her bms are not like they used to be.  Had treatment few days ago and first couple days following, her stools were harder and less often.  Reports today is better, are softer but noticed some mucus in stool.    Discussed possibility of chemo causing constipation.  Pt is not nauseated or vomiting and denies abd pain or fever.  Encouraged increasing water intake. Pt said she has discussed previously with Dr Alvy Bimler and was told could try Miralax - in which she has.  Told pt she could try Miralax today and I will route this note to Dr Alvy Bimler any further instructions. Pt voiced understanding.

## 2018-02-20 NOTE — Telephone Encounter (Signed)
Voicemail received requesting "If you could give me a call, [phone].  I have questions about bowel problems."  Message forwarded to collaborative for further patient communication.

## 2018-02-26 ENCOUNTER — Ambulatory Visit: Payer: Medicare HMO | Admitting: Gynecology

## 2018-03-06 ENCOUNTER — Ambulatory Visit: Payer: Medicare HMO | Admitting: Gynecologic Oncology

## 2018-03-07 ENCOUNTER — Inpatient Hospital Stay: Payer: Medicare HMO

## 2018-03-07 ENCOUNTER — Encounter: Payer: Self-pay | Admitting: Hematology and Oncology

## 2018-03-07 ENCOUNTER — Inpatient Hospital Stay (HOSPITAL_BASED_OUTPATIENT_CLINIC_OR_DEPARTMENT_OTHER): Payer: Medicare HMO | Admitting: Hematology and Oncology

## 2018-03-07 DIAGNOSIS — G629 Polyneuropathy, unspecified: Secondary | ICD-10-CM

## 2018-03-07 DIAGNOSIS — D61818 Other pancytopenia: Secondary | ICD-10-CM | POA: Diagnosis not present

## 2018-03-07 DIAGNOSIS — Z79899 Other long term (current) drug therapy: Secondary | ICD-10-CM

## 2018-03-07 DIAGNOSIS — C7800 Secondary malignant neoplasm of unspecified lung: Secondary | ICD-10-CM

## 2018-03-07 DIAGNOSIS — C539 Malignant neoplasm of cervix uteri, unspecified: Secondary | ICD-10-CM

## 2018-03-07 DIAGNOSIS — C530 Malignant neoplasm of endocervix: Secondary | ICD-10-CM

## 2018-03-07 DIAGNOSIS — Z5111 Encounter for antineoplastic chemotherapy: Secondary | ICD-10-CM | POA: Diagnosis not present

## 2018-03-07 LAB — CMP (CANCER CENTER ONLY)
ALBUMIN: 4 g/dL (ref 3.5–5.0)
ALT: 22 U/L (ref 0–55)
ANION GAP: 9 (ref 3–11)
AST: 19 U/L (ref 5–34)
Alkaline Phosphatase: 90 U/L (ref 40–150)
BUN: 16 mg/dL (ref 7–26)
CHLORIDE: 107 mmol/L (ref 98–109)
CO2: 25 mmol/L (ref 22–29)
Calcium: 9.8 mg/dL (ref 8.4–10.4)
Creatinine: 0.84 mg/dL (ref 0.60–1.10)
GFR, Est AFR Am: >60 mL/min (ref >60)
GFR, Estimated: >60 mL/min (ref >60)
GLUCOSE: 141 mg/dL — AB (ref 70–140)
POTASSIUM: 4.1 mmol/L (ref 3.5–5.1)
SODIUM: 141 mmol/L (ref 136–145)
TOTAL PROTEIN: 7.3 g/dL (ref 6.4–8.3)
Total Bilirubin: 0.3 mg/dL (ref 0.2–1.2)

## 2018-03-07 LAB — CBC WITH DIFFERENTIAL (CANCER CENTER ONLY)
BASOS ABS: 0 10*3/uL (ref 0.0–0.1)
Basophils Relative: 0 %
EOS ABS: 0 10*3/uL (ref 0.0–0.5)
Eosinophils Relative: 0 %
HCT: 32.1 % — ABNORMAL LOW (ref 34.8–46.6)
Hemoglobin: 10.7 g/dL — ABNORMAL LOW (ref 11.6–15.9)
Lymphocytes Relative: 5 %
Lymphs Abs: 0.1 10*3/uL — ABNORMAL LOW (ref 0.9–3.3)
MCH: 30.7 pg (ref 25.1–34.0)
MCHC: 33.3 g/dL (ref 31.5–36.0)
MCV: 92 fL (ref 79.5–101.0)
MONO ABS: 0 10*3/uL — AB (ref 0.1–0.9)
MONOS PCT: 1 %
NEUTROS PCT: 94 %
Neutro Abs: 2.6 10*3/uL (ref 1.5–6.5)
Platelet Count: 145 10*3/uL (ref 145–400)
RBC: 3.49 MIL/uL — ABNORMAL LOW (ref 3.70–5.45)
RDW: 14.1 % (ref 11.2–14.5)
WBC Count: 2.8 10*3/uL — ABNORMAL LOW (ref 3.9–10.3)

## 2018-03-07 MED ORDER — SODIUM CHLORIDE 0.9 % IV SOLN
20.0000 mg | Freq: Once | INTRAVENOUS | Status: AC
  Administered 2018-03-07: 20 mg via INTRAVENOUS
  Filled 2018-03-07: qty 2

## 2018-03-07 MED ORDER — SODIUM CHLORIDE 0.9 % IV SOLN
Freq: Once | INTRAVENOUS | Status: AC
  Administered 2018-03-07: 10:00:00 via INTRAVENOUS

## 2018-03-07 MED ORDER — HEPARIN SOD (PORK) LOCK FLUSH 100 UNIT/ML IV SOLN
500.0000 [IU] | Freq: Once | INTRAVENOUS | Status: AC | PRN
Start: 2018-03-07 — End: 2018-03-07
  Administered 2018-03-07: 500 [IU]
  Filled 2018-03-07: qty 5

## 2018-03-07 MED ORDER — DIPHENHYDRAMINE HCL 50 MG/ML IJ SOLN
50.0000 mg | Freq: Once | INTRAMUSCULAR | Status: AC
  Administered 2018-03-07: 50 mg via INTRAVENOUS

## 2018-03-07 MED ORDER — SODIUM CHLORIDE 0.9 % IV SOLN
175.0000 mg/m2 | Freq: Once | INTRAVENOUS | Status: AC
  Administered 2018-03-07: 312 mg via INTRAVENOUS
  Filled 2018-03-07: qty 52

## 2018-03-07 MED ORDER — FAMOTIDINE IN NACL 20-0.9 MG/50ML-% IV SOLN
INTRAVENOUS | Status: AC
  Filled 2018-03-07: qty 50

## 2018-03-07 MED ORDER — PALONOSETRON HCL INJECTION 0.25 MG/5ML
0.2500 mg | Freq: Once | INTRAVENOUS | Status: AC
  Administered 2018-03-07: 0.25 mg via INTRAVENOUS

## 2018-03-07 MED ORDER — SODIUM CHLORIDE 0.9% FLUSH
10.0000 mL | Freq: Once | INTRAVENOUS | Status: AC
  Administered 2018-03-07: 10 mL
  Filled 2018-03-07: qty 10

## 2018-03-07 MED ORDER — DIPHENHYDRAMINE HCL 50 MG/ML IJ SOLN
INTRAMUSCULAR | Status: AC
  Filled 2018-03-07: qty 1

## 2018-03-07 MED ORDER — FAMOTIDINE IN NACL 20-0.9 MG/50ML-% IV SOLN
20.0000 mg | Freq: Once | INTRAVENOUS | Status: AC
  Administered 2018-03-07: 20 mg via INTRAVENOUS

## 2018-03-07 MED ORDER — SODIUM CHLORIDE 0.9% FLUSH
10.0000 mL | INTRAVENOUS | Status: DC | PRN
  Administered 2018-03-07: 10 mL
  Filled 2018-03-07: qty 10

## 2018-03-07 MED ORDER — PALONOSETRON HCL INJECTION 0.25 MG/5ML
INTRAVENOUS | Status: AC
  Filled 2018-03-07: qty 5

## 2018-03-07 MED ORDER — CARBOPLATIN CHEMO INJECTION 600 MG/60ML
420.0000 mg | Freq: Once | INTRAVENOUS | Status: AC
  Administered 2018-03-07: 420 mg via INTRAVENOUS
  Filled 2018-03-07: qty 42

## 2018-03-07 NOTE — Patient Instructions (Signed)
   [facility] Discharge Instructions for Patients Receiving Chemotherapy  Today you received the following chemotherapy agents Taxol and Carboplatin   To help prevent nausea and vomiting after your treatment, we encourage you to take your nausea medication as directed.    If you develop nausea and vomiting that is not controlled by your nausea medication, call the clinic.   BELOW ARE SYMPTOMS THAT SHOULD BE REPORTED IMMEDIATELY:  *FEVER GREATER THAN 100.5 F  *CHILLS WITH OR WITHOUT FEVER  NAUSEA AND VOMITING THAT IS NOT CONTROLLED WITH YOUR NAUSEA MEDICATION  *UNUSUAL SHORTNESS OF BREATH  *UNUSUAL BRUISING OR BLEEDING  TENDERNESS IN MOUTH AND THROAT WITH OR WITHOUT PRESENCE OF ULCERS  *URINARY PROBLEMS  *BOWEL PROBLEMS  UNUSUAL RASH Items with * indicate a potential emergency and should be followed up as soon as possible.  Feel free to call the clinic should you have any questions or concerns. The clinic phone number is [phone].  Please show the CHEMO ALERT CARD at check-in to the Emergency Department and triage nurse.   

## 2018-03-07 NOTE — Assessment & Plan Note (Signed)
She has chronic pancytopenia We would proceed with treatment with G-CSF support She is currently not symptomatic 

## 2018-03-07 NOTE — Assessment & Plan Note (Signed)
She is currently not symptomatic I plan to repeat imaging study after 3 cycles of treatment

## 2018-03-07 NOTE — Assessment & Plan Note (Signed)
Unfortunately, additional testing for PD 1 was not possible due to the nature of her prior biopsy I do not recommend lung biopsy at this point I have reviewed this with GYN oncologist and our plan would be to proceed with palliative systemic chemotherapy So far, she tolerated chemo well except for mild pancytopenia and peripheral neuropathy We will proceed with minimum 3 cycles of treatment before repeat imaging study

## 2018-03-07 NOTE — Patient Instructions (Signed)

## 2018-03-07 NOTE — Progress Notes (Signed)
[facility] OFFICE PROGRESS NOTE  Patient Care Team: Cloward, [name], MD as PCP - General (Internal Medicine) Heath Lark, MD as Consulting Physician (Hematology and Oncology) Gery Pray, MD as Consulting Physician (Radiation Oncology) Marti Sleigh, MD as Attending Physician (Gynecology)  ASSESSMENT & PLAN:  Malignant neoplasm of cervix [facility]) Unfortunately, additional testing for PD 1 was not possible due to the nature of her prior biopsy I do not recommend lung biopsy at this point I have reviewed this with GYN oncologist and our plan would be to proceed with palliative systemic chemotherapy So far, she tolerated chemo well except for mild pancytopenia and peripheral neuropathy We will proceed with minimum 3 cycles of treatment before repeat imaging study  Pancytopenia, acquired [facility]) She has chronic pancytopenia We would proceed with treatment with G-CSF support She is currently not symptomatic  Metastasis to lung [facility]) She is currently not symptomatic I plan to repeat imaging study after 3 cycles of treatment   No orders of the defined types were placed in this encounter.   INTERVAL HISTORY: Please see below for problem oriented charting. She returns with her husband to be seen prior to cycle 3 of chemotherapy She had very mild intermittent numbness affecting her toes right greater than the left but not persistent or severe She had mild intermittent constipation, resolved with laxative No recent nausea or vomiting No recent infection Denies vaginal bleeding No recent cough, chest pain or shortness of breath  SUMMARY OF ONCOLOGIC HISTORY:   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[place])   07/04/2017 Initial Diagnosis    She presented to the GYN clinic with postmenopausal bleeding. Examination revealed abnormal cervix. Vaginal ultrasound showed a 10 cm uterus with an intramural fibroid, left ovary is slightly enlarged measuring 2.1 x 1.5 x 2.3 cm which is  described as solid complex with a vascular flow. In addition the cervix is imaged showing a solid vascular mass measuring 2.7 x 2.1 x 2.2 cm. A sonohysterogram showed no abnormalities in the endometrium. Patient had a Pap smear showing high-grade squamous intraepithelial lesion with features suspicious for invasion as well as atypical glandular cells. 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      08/29/2017 PET scan    1. Cervical malignancy with maximum SUV 6.0, dominant cervical mass measuring 6.2 by 6.0 by 7.3 cm. 2. 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 3. There 12 scattered small pulmonary nodules measuring up to 5 mm in diameter. No hypermetabolic activity but these are below sensitive PET-CT size thresholds. These could be postinflammatory or less likely neoplastic, surveillance is recommended. 4. Hypodense thyroid nodules are not hypermetabolic which strongly favors benign etiology.      09/10/2017 Procedure    Placement of a subcutaneous port device.      09/12/2017 - 10/10/2017 Chemotherapy    The patient had weekly cisplatin for chemotherapy treatment.       09/12/2017 - 11/20/2017 Radiation Therapy    Radiation treatment dates:  External beam: 09/12/2017-10/29/2017, brachytherapy: 11/06/2017, 11/08/2017, 11/13/2017, 11/15/2017, 11/20/2017  Site/dose:   1. pelvis, 1.8 Gy in 25 fractions for a total dose of 45 Gy                      2. Sidewall Boost, 1.8 Gy in 5 fractions for a total dose of 9 Gy  3. Nodal boost  2, 1.8 Gy in 2 fractions for a total dose of 3.6 Gy                      4. Cervix, 5.5 Gy (11/06/17), 6 Gy (11/08/17), 6 Gy (11/13/17), 5.5 Gy (11/15/17), 5.5 Gy (11/20/17)  Beams/energy:  1. 3D, 15X                              2. 3D, 15X                              3. 3D, 15X                              4.  Brachytherapy via tandem-ring, [id number]      12/27/2017 PET scan    1. Interval mixed response to therapy. Interval decrease in hypermetabolism associated with the cervical mass. Left pelvic sidewall hypermetabolic lymph node seen previously has resolved. 2. Interval progression of bilateral pulmonary nodules with demonstrable FDG accumulation in these nodules on today's study. Imaging features are compatible with metastatic disease. There is a new 3 mm nodule in the left upper lobe. 3. Focus of FDG accumulation in the left adnexal region, likely related to the left ovary but tracks up along the left side of the uterus. This is indeterminate and may be related to the ovary although metastatic involvement is not excluded. Close attention on follow-up recommended. Pelvic MRI without and with contrast may possibly provide additional insight as clinically warranted.      01/21/2018 -  Chemotherapy    The patient had palonosetron (ALOXI) injection 0.25 mg, 0.25 mg, Intravenous,  Once, 3 of 4 cycles Administration: 0.25 mg (01/24/2018), 0.25 mg (02/14/2018) CARBOplatin (PARAPLATIN) 420 mg in sodium chloride 0.9 % 250 mL chemo infusion, 420 mg (100 % of original dose 417 mg), Intravenous,  Once, 3 of 4 cycles Dose modification:   (original dose 417 mg, Cycle 1), 420 mg (original dose 417 mg, Cycle 2) Administration: 420 mg (01/24/2018), 420 mg (02/14/2018) PACLitaxel (TAXOL) 312 mg in sodium chloride 0.9 % 500 mL chemo infusion (> 80mg /m2), 175 mg/m2 = 312 mg, Intravenous,  Once, 3 of 4 cycles Administration: 312 mg (01/24/2018), 312 mg (02/14/2018)  for chemotherapy treatment.        Metastasis to lung ([place])   01/15/2018 Initial Diagnosis    Metastasis to lung ([place])      01/21/2018 -  Chemotherapy    The patient had palonosetron (ALOXI) injection 0.25 mg, 0.25 mg, Intravenous,  Once, 3 of 4 cycles Administration: 0.25 mg (01/24/2018), 0.25 mg (02/14/2018) CARBOplatin (PARAPLATIN) 420 mg in sodium chloride 0.9  % 250 mL chemo infusion, 420 mg (100 % of original dose 417 mg), Intravenous,  Once, 3 of 4 cycles Dose modification:   (original dose 417 mg, Cycle 1), 420 mg (original dose 417 mg, Cycle 2) Administration: 420 mg (01/24/2018), 420 mg (02/14/2018) PACLitaxel (TAXOL) 312 mg in sodium chloride 0.9 % 500 mL chemo infusion (> 80mg /m2), 175 mg/m2 = 312 mg, Intravenous,  Once, 3 of 4 cycles Administration: 312 mg (01/24/2018), 312 mg (02/14/2018)  for chemotherapy treatment.        REVIEW OF SYSTEMS:   Constitutional: Denies fevers, chills or abnormal weight loss Eyes: Denies blurriness of vision Ears, nose,  mouth, throat, and face: Denies mucositis or sore throat Respiratory: Denies cough, dyspnea or wheezes Cardiovascular: Denies palpitation, chest discomfort or lower extremity swelling Gastrointestinal:  Denies nausea, heartburn or change in bowel habits Skin: Denies abnormal skin rashes Lymphatics: Denies new lymphadenopathy or easy bruising Behavioral/Psych: Mood is stable, no new changes  All other systems were reviewed with the patient and are negative.  I have reviewed the past medical history, past surgical history, social history and family history with the patient and they are unchanged from previous note.  ALLERGIES:  is allergic to tegaderm ag mesh [silver].  MEDICATIONS:  Current Outpatient Medications  Medication Sig Dispense Refill  . dexamethasone (DECADRON) 4 MG tablet Take 5 tabs the night before and 5 tabs in the morning of chemotherapy, every 3 weeks, with food 60 tablet 1  . lidocaine-prilocaine (EMLA) cream Apply to affected area once 30 g 3  . ondansetron (ZOFRAN) 8 MG tablet Take 1 tablet (8 mg total) by mouth every 8 (eight) hours as needed for refractory nausea / vomiting. Start on day 3 after chemo. 30 tablet 1  . prochlorperazine (COMPAZINE) 10 MG tablet Take 1 tablet (10 mg total) by mouth every 6 (six) hours as needed (Nausea or vomiting). 30 tablet 1   No  current facility-administered medications for this visit.    Facility-Administered Medications Ordered in Other Visits  Medication Dose Route Frequency Provider Last Rate Last Dose  . CARBOplatin (PARAPLATIN) 420 mg in sodium chloride 0.9 % 250 mL chemo infusion  420 mg Intravenous Once Alvy Bimler, Bethann Qualley, MD      . heparin lock flush 100 unit/mL  500 Units Intracatheter Once PRN Alvy Bimler, Meiling Hendriks, MD      . PACLitaxel (TAXOL) 312 mg in sodium chloride 0.9 % 500 mL chemo infusion (> 80mg /m2)  175 mg/m2 (Treatment Plan Recorded) Intravenous Once Heath Lark, MD 184 mL/hr at 03/07/18 1102 312 mg at 03/07/18 1102  . sodium chloride flush (NS) 0.9 % injection 10 mL  10 mL Intracatheter PRN Alvy Bimler, Cecile Gillispie, MD        PHYSICAL EXAMINATION: ECOG PERFORMANCE STATUS: 1 - Symptomatic but completely ambulatory  Vitals:   03/07/18 0910  BP: 135/70  Pulse: 88  Resp: 18  Temp: 98.8 F (37.1 C)  SpO2: 100%   Filed Weights   03/07/18 0910  Weight: 154 lb 12.8 oz (70.2 kg)    GENERAL:alert, no distress and comfortable SKIN: skin color, texture, turgor are normal, no rashes or significant lesions EYES: normal, Conjunctiva are pink and non-injected, sclera clear OROPHARYNX:no exudate, no erythema and lips, buccal mucosa, and tongue normal  NECK: supple, thyroid normal size, non-tender, without nodularity LYMPH:  no palpable lymphadenopathy in the cervical, axillary or inguinal LUNGS: clear to auscultation and percussion with normal breathing effort HEART: regular rate & rhythm and no murmurs and no lower extremity edema ABDOMEN:abdomen soft, non-tender and normal bowel sounds Musculoskeletal:no cyanosis of digits and no clubbing  NEURO: alert & oriented x 3 with fluent speech, no focal motor/sensory deficits  LABORATORY DATA:  I have reviewed the data as listed    Component Value Date/Time   NA 141 03/07/2018 0903   NA 141 10/15/2017 0900   K 4.1 03/07/2018 0903   K 4.2 10/15/2017 0900   CL 107 03/07/2018  0903   CO2 25 03/07/2018 0903   CO2 28 10/15/2017 0900   GLUCOSE 141 (H) 03/07/2018 0903   GLUCOSE 105 10/15/2017 0900   BUN 16 03/07/2018 0903   BUN  10.8 10/15/2017 0900   CREATININE 0.84 03/07/2018 0903   CREATININE 1.1 10/15/2017 0900   CALCIUM 9.8 03/07/2018 0903   CALCIUM 9.3 10/15/2017 0900   PROT 7.3 03/07/2018 0903   PROT 7.1 10/15/2017 0900   ALBUMIN 4.0 03/07/2018 0903   ALBUMIN 3.7 10/15/2017 0900   AST 19 03/07/2018 0903   AST 17 10/15/2017 0900   ALT 22 03/07/2018 0903   ALT 19 10/15/2017 0900   ALKPHOS 90 03/07/2018 0903   ALKPHOS 95 10/15/2017 0900   BILITOT 0.3 03/07/2018 0903   BILITOT 0.29 10/15/2017 0900   GFRNONAA >60 03/07/2018 0903   GFRAA >60 03/07/2018 0903    No results found for: SPEP, UPEP  Lab Results  Component Value Date   WBC 2.8 (L) 03/07/2018   NEUTROABS 2.6 03/07/2018   HGB 10.7 (L) 03/07/2018   HCT 32.1 (L) 03/07/2018   MCV 92.0 03/07/2018   PLT 145 03/07/2018      Chemistry      Component Value Date/Time   NA 141 03/07/2018 0903   NA 141 10/15/2017 0900   K 4.1 03/07/2018 0903   K 4.2 10/15/2017 0900   CL 107 03/07/2018 0903   CO2 25 03/07/2018 0903   CO2 28 10/15/2017 0900   BUN 16 03/07/2018 0903   BUN 10.8 10/15/2017 0900   CREATININE 0.84 03/07/2018 0903   CREATININE 1.1 10/15/2017 0900      Component Value Date/Time   CALCIUM 9.8 03/07/2018 0903   CALCIUM 9.3 10/15/2017 0900   ALKPHOS 90 03/07/2018 0903   ALKPHOS 95 10/15/2017 0900   AST 19 03/07/2018 0903   AST 17 10/15/2017 0900   ALT 22 03/07/2018 0903   ALT 19 10/15/2017 0900   BILITOT 0.3 03/07/2018 0903   BILITOT 0.29 10/15/2017 0900       All questions were answered. The patient knows to call the clinic with any problems, questions or concerns. No barriers to learning was detected.  I spent 15 minutes counseling the patient face to face. The total time spent in the appointment was 20 minutes and more than 50% was on counseling and review of test  results  Heath Lark, MD 03/07/2018 12:46 PM

## 2018-03-09 ENCOUNTER — Inpatient Hospital Stay: Payer: Medicare HMO

## 2018-03-09 VITALS — BP 128/69 | HR 84 | Temp 98.7°F | Resp 16

## 2018-03-09 DIAGNOSIS — Z5111 Encounter for antineoplastic chemotherapy: Secondary | ICD-10-CM | POA: Diagnosis not present

## 2018-03-09 DIAGNOSIS — C530 Malignant neoplasm of endocervix: Secondary | ICD-10-CM

## 2018-03-09 MED ORDER — PEGFILGRASTIM-CBQV 6 MG/0.6ML ~~LOC~~ SOSY
6.0000 mg | PREFILLED_SYRINGE | Freq: Once | SUBCUTANEOUS | Status: AC
  Administered 2018-03-09: 6 mg via SUBCUTANEOUS

## 2018-03-09 MED ORDER — PEGFILGRASTIM-CBQV 6 MG/0.6ML ~~LOC~~ SOSY
PREFILLED_SYRINGE | SUBCUTANEOUS | Status: AC
  Filled 2018-03-09: qty 0.6

## 2018-03-20 ENCOUNTER — Telehealth: Payer: Self-pay | Admitting: *Deleted

## 2018-03-20 NOTE — Telephone Encounter (Signed)
Called and spoke with the patient, moved appt on June 7th from 2:30pm to 9:15am

## 2018-03-21 ENCOUNTER — Encounter (HOSPITAL_COMMUNITY)
Admission: RE | Admit: 2018-03-21 | Discharge: 2018-03-21 | Disposition: A | Discharge status: Home / Self Care | Payer: Medicare HMO | Source: Ambulatory Visit | Attending: Hematology and Oncology | Admitting: Hematology and Oncology

## 2018-03-21 DIAGNOSIS — C7800 Secondary malignant neoplasm of unspecified lung: Secondary | ICD-10-CM | POA: Insufficient documentation

## 2018-03-21 DIAGNOSIS — C530 Malignant neoplasm of endocervix: Secondary | ICD-10-CM | POA: Insufficient documentation

## 2018-03-21 LAB — GLUCOSE, CAPILLARY: Glucose-Capillary: 101 mg/dL — ABNORMAL HIGH (ref 65–99)

## 2018-03-22 ENCOUNTER — Inpatient Hospital Stay: Payer: Medicare HMO | Attending: Hematology and Oncology | Admitting: Gynecology

## 2018-03-22 ENCOUNTER — Encounter: Payer: Self-pay | Admitting: Gynecology

## 2018-03-22 VITALS — BP 143/75 | HR 113 | Temp 99.0°F | Resp 20 | Ht 65.0 in | Wt 143.9 lb

## 2018-03-22 DIAGNOSIS — C541 Malignant neoplasm of endometrium: Secondary | ICD-10-CM | POA: Insufficient documentation

## 2018-03-22 DIAGNOSIS — C7801 Secondary malignant neoplasm of right lung: Secondary | ICD-10-CM | POA: Insufficient documentation

## 2018-03-22 DIAGNOSIS — Z5111 Encounter for antineoplastic chemotherapy: Secondary | ICD-10-CM | POA: Diagnosis not present

## 2018-03-22 DIAGNOSIS — C7802 Secondary malignant neoplasm of left lung: Secondary | ICD-10-CM

## 2018-03-22 DIAGNOSIS — Z923 Personal history of irradiation: Secondary | ICD-10-CM | POA: Diagnosis not present

## 2018-03-22 DIAGNOSIS — C530 Malignant neoplasm of endocervix: Secondary | ICD-10-CM

## 2018-03-22 NOTE — Progress Notes (Addendum)
Consult Note: Gyn-Onc The patient is encouraged to begin taking vitamin B6 50 mg daily regarding her peripheral neuropathy.  Maureen Vaughan 67 y.o. female  Chief Complaint  Patient presents with  . 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   Pulmonary metastases which are stable following 3 cycles of carboplatin and Taxol.  The patient is tolerating the chemotherapy well.  She has minimal peripheral neuropathy in her right foot..  She does not have any neuropathy in her hands.  Plan: The results of the recent PET scan were reviewed with the patient.  Given that she has stable pulmonary disease I would recommend that she continue the current chemotherapy regimen with a plan of administering 3 more cycles and then repeating a PET scan.  Patient would like to take a trip to [place] and I think that would be certainly reasonable in between her treatment cycles.  She return to Dr. Alvy Bimler for continuing chemotherapy.  Interval history:patient returns today for continuing followup.  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  A PET scan recently showed stable disease in the lung with no new lesions.  Patient seems to be tolerating the chemotherapy regimen well.  On direct questioning she does have some mild peripheral neuropathy in her right foot but otherwise is doing quite nicely.  She has no vaginal bleeding and has no GI, GU or pelvic symptoms.  HPI: 66 year old African American female seen in consultation at the request of Dr. Charlesetta Garibaldi regarding management of a newly diagnosed adenocarcinoma. The patient initially presented with postmenopausal spotting. The patient is had an extensive evaluation with the following findings. Vaginal ultrasound showed a 10 cm uterus with an intramural fibroid, left ovary is slightly enlarged measuring 2.1 x 1.5 x 2.3 cm which is described as solid complex with a  vascular flow. In addition the cervix is imaged showing a solid vascular mass measuring 2.7 x 2.1 x 2.2 cm. A sonohysterogram showed no abnormalities in the endometrium. Patient had a Pap smear showing high-grade squamous intraepithelial lesion with features suspicious for invasion as well as atypical glandular cells. 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  The patient says she has had Pap smears in the last 2 years that were normal. She has no family history of gynecologic cancers. She has not had any pelvic surgery. Overall her health is good.  She was treated with pelvic radiation/brachy therapy and concurrent cisplatin.  Following completion of pelvic radiation, she was found to have clearcut pulmonary mets.  Review of Systems:10 point review of systems is negative except as noted in interval history.   Vitals: Blood pressure (!) 143/75, pulse (!) 113, temperature 99 F (37.2 C), temperature source Oral, resp. rate 20, height 5\' 5"  (1.651 m), weight 143 lb 14.4 oz (65.3 kg), SpO2 100 %.  Physical Exam: General : The patient is a healthy woman in no acute distress.  HEENT: normocephalic, extraoccular movements normal; neck is supple without thyromegally  Lynphnodes: Supraclavicular and inguinal nodes not enlarged  Abdomen: Soft, non-tender, no ascites, no organomegally, no masses, no hernias  Pelvic:  EGBUS: Normal female  Vagina: Normal, no lesions  Urethra and Bladder: Normal, non-tender  Cervix:Flush with vaginal vault.  No lesions are seen Uterus:  Normal shape and size Bi-manual examination: No parametrial disease Rectal: normal sphincter tone, no  masses, no blood  Lower extremities: No edema or varicosities. Normal range of motion      Allergies  Allergen Reactions  . Tegaderm Ag Mesh [Silver] Rash    "per pt caused rash and blisters"    (OPSITE OK TO USE)    Past Medical History:  Diagnosis  Date  . Cervical adenocarcinoma [facility]) primary oncologist-  dr gorsuch/  oncologist-  dr Sondra Come   dx 07-04-2017  Stage IIB w/ lMETs to eft external iliac adenopathy--- completed adjunct chemo and external radiation (09-12-2017 to 10-19-2017) and to start direct high dose radiation 11-06-2017 x5 treatments  . Chemotherapy induced neutropenia ([place])   . Chronic nausea    due to chemo  . Complication of anesthesia    ponv 11-06-17  . History of colon polyps 2015   per pt benign  . History of radiation therapy 09/12/2017-10/29/2017   pelvis 45 Gy in 25 fractions, sidewall boost 9 Gy in 5 fractions, nodal boost 3.6 Gy in 2 fractions  . History of radiation therapy 11/06/17-11/20/17   brachytherapy via tandem ring, cervix 5.5 Gy (11/06/17), 6 Gy (11/08/17), 6 Gy (11/13/17), 5.5 Gy (11/15/17) and 5.5 Gy (11/20/17)  . Leukopenia due to antineoplastic chemotherapy ([place])   . Multiple lung nodules   . Thyroid nodule   . Wears glasses     Past Surgical History:  Procedure Laterality Date  . COLONOSCOPY  2015  . IR FLUORO GUIDE PORT INSERTION RIGHT  09/10/2017  . IR US GUIDE VASC ACCESS RIGHT  09/10/2017  . TANDEM RING INSERTION N/A 11/06/2017   Procedure: TANDEM RING INSERTION;  Surgeon: Gery Pray, MD;  Location: [facility];  Service: Urology;  Laterality: N/A;  . TANDEM RING INSERTION N/A 11/08/2017   Procedure: TANDEM RING INSERTION;  Surgeon: Gery Pray, MD;  Location: [facility];  Service: Urology;  Laterality: N/A;  . TANDEM RING INSERTION N/A 11/13/2017   Procedure: TANDEM RING INSERTION;  Surgeon: Gery Pray, MD;  Location: [facility];  Service: Urology;  Laterality: N/A;  . TANDEM RING INSERTION N/A 11/15/2017   Procedure: TANDEM RING INSERTION;  Surgeon: Gery Pray, MD;  Location: [facility];  Service: Urology;  Laterality: N/A;  . TANDEM RING INSERTION N/A 11/20/2017   Procedure: TANDEM RING INSERTION;  Surgeon: Gery Pray,  MD;  Location[facility];  Service: Urology;  Laterality: N/A;  . TONSILLECTOMY  child    Current Outpatient Medications  Medication Sig Dispense Refill  . dexamethasone (DECADRON) 4 MG tablet Take 5 tabs the night before and 5 tabs in the morning of chemotherapy, every 3 weeks, with food (Patient not taking: Reported on 03/22/2018) 60 tablet 1  . lidocaine-prilocaine (EMLA) cream Apply to affected area once (Patient not taking: Reported on 03/22/2018) 30 g 3  . ondansetron (ZOFRAN) 8 MG tablet Take 1 tablet (8 mg total) by mouth every 8 (eight) hours as needed for refractory nausea / vomiting. Start on day 3 after chemo. (Patient not taking: Reported on 03/22/2018) 30 tablet 1  . prochlorperazine (COMPAZINE) 10 MG tablet Take 1 tablet (10 mg total) by mouth every 6 (six) hours as needed (Nausea or vomiting). (Patient not taking: Reported on 03/22/2018) 30 tablet 1   No current facility-administered medications for this visit.     Social History   Socioeconomic History  . Marital status: Married    Spouse name: Not on file  . Number of children: 1  . Years of education:  Not on file  . Highest education level: Not on file  Occupational History  . Occupation: retired  Scientific laboratory technician  . Financial resource strain: Not on file  . Food insecurity:    Worry: Not on file    Inability: Not on file  . Transportation needs:    Medical: Not on file    Non-medical: Not on file  Tobacco Use  . Smoking status: Never Smoker  . Smokeless tobacco: Never Used  Substance and Sexual Activity  . Alcohol use: No  . Drug use: No  . Sexual activity: Yes  Lifestyle  . Physical activity:    Days per week: Not on file    Minutes per session: Not on file  . Stress: Not on file  Relationships  . Social connections:    Talks on phone: Not on file    Gets together: Not on file    Attends religious service: Not on file    Active member of club or organization: Not on file    Attends meetings  of clubs or organizations: Not on file    Relationship status: Not on file  . Intimate partner violence:    Fear of current or ex partner: Not on file    Emotionally abused: Not on file    Physically abused: Not on file    Forced sexual activity: Not on file  Other Topics Concern  . Not on file  Social History Narrative  . Not on file    Family History  Problem Relation Age of Onset  . Hypertension Mother   . Dementia Mother   . Colon cancer Father 21      Marti Sleigh, MD 03/22/2018, 9:16 AM

## 2018-03-22 NOTE — Patient Instructions (Signed)
You can begin taking Vitamin B6 50 mg daily for neuropathy symptoms.  Plan to continue with three additional cycles with imaging after then follow up with Dr. Fermin Schwab.  Please call for any questions or concerns.

## 2018-03-28 ENCOUNTER — Inpatient Hospital Stay: Payer: Medicare HMO

## 2018-03-28 ENCOUNTER — Telehealth: Payer: Self-pay | Admitting: Hematology and Oncology

## 2018-03-28 ENCOUNTER — Encounter: Payer: Self-pay | Admitting: Hematology and Oncology

## 2018-03-28 ENCOUNTER — Inpatient Hospital Stay (HOSPITAL_BASED_OUTPATIENT_CLINIC_OR_DEPARTMENT_OTHER): Payer: Medicare HMO | Admitting: Hematology and Oncology

## 2018-03-28 VITALS — HR 95

## 2018-03-28 DIAGNOSIS — C7800 Secondary malignant neoplasm of unspecified lung: Secondary | ICD-10-CM

## 2018-03-28 DIAGNOSIS — C539 Malignant neoplasm of cervix uteri, unspecified: Secondary | ICD-10-CM

## 2018-03-28 DIAGNOSIS — G620 Drug-induced polyneuropathy: Secondary | ICD-10-CM | POA: Diagnosis not present

## 2018-03-28 DIAGNOSIS — C530 Malignant neoplasm of endocervix: Secondary | ICD-10-CM | POA: Diagnosis not present

## 2018-03-28 DIAGNOSIS — D61818 Other pancytopenia: Secondary | ICD-10-CM | POA: Diagnosis not present

## 2018-03-28 DIAGNOSIS — Z5111 Encounter for antineoplastic chemotherapy: Secondary | ICD-10-CM | POA: Diagnosis not present

## 2018-03-28 DIAGNOSIS — T451X5A Adverse effect of antineoplastic and immunosuppressive drugs, initial encounter: Secondary | ICD-10-CM

## 2018-03-28 LAB — CMP (CANCER CENTER ONLY)
ALT: 13 U/L (ref 0–55)
ANION GAP: 10 (ref 3–11)
AST: 19 U/L (ref 5–34)
Albumin: 4.1 g/dL (ref 3.5–5.0)
Alkaline Phosphatase: 97 U/L (ref 40–150)
BUN: 20 mg/dL (ref 7–26)
CHLORIDE: 104 mmol/L (ref 98–109)
CO2: 26 mmol/L (ref 22–29)
Calcium: 10.1 mg/dL (ref 8.4–10.4)
Creatinine: 0.93 mg/dL (ref 0.60–1.10)
GFR, Est AFR Am: >60 mL/min (ref >60)
GFR, Estimated: >60 mL/min (ref >60)
Glucose, Bld: 160 mg/dL — ABNORMAL HIGH (ref 70–140)
POTASSIUM: 4.1 mmol/L (ref 3.5–5.1)
Sodium: 140 mmol/L (ref 136–145)
TOTAL PROTEIN: 7.5 g/dL (ref 6.4–8.3)
Total Bilirubin: 0.3 mg/dL (ref 0.2–1.2)

## 2018-03-28 LAB — CBC WITH DIFFERENTIAL (CANCER CENTER ONLY)
BASOS ABS: 0 10*3/uL (ref 0.0–0.1)
Basophils Relative: 0 %
EOS PCT: 0 %
Eosinophils Absolute: 0 10*3/uL (ref 0.0–0.5)
HEMATOCRIT: 30.8 % — AB (ref 34.8–46.6)
HEMOGLOBIN: 10.4 g/dL — AB (ref 11.6–15.9)
LYMPHS ABS: 0.1 10*3/uL — AB (ref 0.9–3.3)
LYMPHS PCT: 4 %
MCH: 30.9 pg (ref 25.1–34.0)
MCHC: 33.8 g/dL (ref 31.5–36.0)
MCV: 91.5 fL (ref 79.5–101.0)
Monocytes Absolute: 0 10*3/uL — ABNORMAL LOW (ref 0.1–0.9)
Monocytes Relative: 1 %
NEUTROS ABS: 2.8 10*3/uL (ref 1.5–6.5)
NEUTROS PCT: 95 %
PLATELETS: 135 10*3/uL — AB (ref 145–400)
RBC: 3.37 MIL/uL — AB (ref 3.70–5.45)
RDW: 16.5 % — ABNORMAL HIGH (ref 11.2–14.5)
WBC: 2.9 10*3/uL — AB (ref 3.9–10.3)

## 2018-03-28 MED ORDER — SODIUM CHLORIDE 0.9% FLUSH
10.0000 mL | Freq: Once | INTRAVENOUS | Status: AC
  Administered 2018-03-28: 10 mL
  Filled 2018-03-28: qty 10

## 2018-03-28 MED ORDER — SODIUM CHLORIDE 0.9 % IV SOLN
Freq: Once | INTRAVENOUS | Status: AC
  Administered 2018-03-28: 10:00:00 via INTRAVENOUS

## 2018-03-28 MED ORDER — PACLITAXEL CHEMO INJECTION 300 MG/50ML
140.0000 mg/m2 | Freq: Once | INTRAVENOUS | Status: AC
  Administered 2018-03-28: 246 mg via INTRAVENOUS
  Filled 2018-03-28: qty 41

## 2018-03-28 MED ORDER — SODIUM CHLORIDE 0.9 % IV SOLN
420.0000 mg | Freq: Once | INTRAVENOUS | Status: AC
  Administered 2018-03-28: 420 mg via INTRAVENOUS
  Filled 2018-03-28: qty 42

## 2018-03-28 MED ORDER — DEXAMETHASONE SODIUM PHOSPHATE 100 MG/10ML IJ SOLN
20.0000 mg | Freq: Once | INTRAMUSCULAR | Status: AC
  Administered 2018-03-28: 20 mg via INTRAVENOUS
  Filled 2018-03-28: qty 2

## 2018-03-28 MED ORDER — FAMOTIDINE IN NACL 20-0.9 MG/50ML-% IV SOLN
20.0000 mg | Freq: Once | INTRAVENOUS | Status: AC
  Administered 2018-03-28: 20 mg via INTRAVENOUS

## 2018-03-28 MED ORDER — SODIUM CHLORIDE 0.9% FLUSH
10.0000 mL | INTRAVENOUS | Status: DC | PRN
  Administered 2018-03-28: 10 mL
  Filled 2018-03-28: qty 10

## 2018-03-28 MED ORDER — PALONOSETRON HCL INJECTION 0.25 MG/5ML
INTRAVENOUS | Status: AC
  Filled 2018-03-28: qty 5

## 2018-03-28 MED ORDER — FAMOTIDINE IN NACL 20-0.9 MG/50ML-% IV SOLN
INTRAVENOUS | Status: AC
  Filled 2018-03-28: qty 50

## 2018-03-28 MED ORDER — DIPHENHYDRAMINE HCL 50 MG/ML IJ SOLN
50.0000 mg | Freq: Once | INTRAMUSCULAR | Status: AC
  Administered 2018-03-28: 50 mg via INTRAVENOUS

## 2018-03-28 MED ORDER — PALONOSETRON HCL INJECTION 0.25 MG/5ML
0.2500 mg | Freq: Once | INTRAVENOUS | Status: AC
  Administered 2018-03-28: 0.25 mg via INTRAVENOUS

## 2018-03-28 MED ORDER — HEPARIN SOD (PORK) LOCK FLUSH 100 UNIT/ML IV SOLN
500.0000 [IU] | Freq: Once | INTRAVENOUS | Status: AC | PRN
  Administered 2018-03-28: 500 [IU]
  Filled 2018-03-28: qty 5

## 2018-03-28 MED ORDER — DIPHENHYDRAMINE HCL 50 MG/ML IJ SOLN
INTRAMUSCULAR | Status: AC
Start: 2018-03-28 — End: ?
  Filled 2018-03-28: qty 1

## 2018-03-28 NOTE — Progress Notes (Signed)
[place] OFFICE PROGRESS NOTE  Patient Care Team: Cloward, [name], MD as PCP - General (Internal Medicine) Heath Lark, MD as Consulting Physician (Hematology and Oncology) Gery Pray, MD as Consulting Physician (Radiation Oncology) Marti Sleigh, MD as Attending Physician (Gynecology)  ASSESSMENT & PLAN:  Malignant neoplasm of cervix [facility]) She tolerated chemotherapy well except for pancytopenia and peripheral neuropathy Recommend minor dose adjustment of Taxol due to neuropathy I recommend repeat imaging study after 6 cycles of treatment  Metastasis to lung [facility]) She is currently not symptomatic Recent imaging studies showed mild reduction in hypermetabolic activity with no new lesions I plan to repeat imaging study after 6 cycles of treatment  Peripheral neuropathy due to chemotherapy [facility]) she has mild peripheral neuropathy, likely related to side effects of treatment. I plan to reduce the dose of treatment as outlined above.  I explained to the patient the rationale of this strategy and reassured the patient it would not compromise the efficacy of treatment   Pancytopenia, acquired [facility]) She has chronic pancytopenia We would proceed with treatment with G-CSF support She is currently not symptomatic   No orders of the defined types were placed in this encounter.   INTERVAL HISTORY: Please see below for problem oriented charting. She returns with her husband for cycle 4 of chemotherapy She wanted to discuss some findings noted on recent PET CT scan She denies cough, chest pain or shortness of breath She denies recent nausea or vomiting She is noted worsening mild peripheral neuropathy in her feet She denies recent infection, fever or chills  SUMMARY OF ONCOLOGIC HISTORY:   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[place])   07/04/2017 Initial Diagnosis    She presented to the GYN clinic with postmenopausal bleeding. Examination revealed abnormal cervix.  Vaginal ultrasound showed a 10 cm uterus with an intramural fibroid, left ovary is slightly enlarged measuring 2.1 x 1.5 x 2.3 cm which is described as solid complex with a vascular flow. In addition the cervix is imaged showing a solid vascular mass measuring 2.7 x 2.1 x 2.2 cm. A sonohysterogram showed no abnormalities in the endometrium. Patient had a Pap smear showing high-grade squamous intraepithelial lesion with features suspicious for invasion as well as atypical glandular cells. 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      08/29/2017 PET scan    1. Cervical malignancy with maximum SUV 6.0, dominant cervical mass measuring 6.2 by 6.0 by 7.3 cm. 2. 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 3. There 12 scattered small pulmonary nodules measuring up to 5 mm in diameter. No hypermetabolic activity but these are below sensitive PET-CT size thresholds. These could be postinflammatory or less likely neoplastic, surveillance is recommended. 4. Hypodense thyroid nodules are not hypermetabolic which strongly favors benign etiology.      09/10/2017 Procedure    Placement of a subcutaneous port device.      09/12/2017 - 10/10/2017 Chemotherapy    The patient had weekly cisplatin for chemotherapy treatment.       09/12/2017 - 11/20/2017 Radiation Therapy    Radiation treatment dates:  External beam: 09/12/2017-10/29/2017, brachytherapy: 11/06/2017, 11/08/2017, 11/13/2017, 11/15/2017, 11/20/2017  Site/dose:   1. pelvis, 1.8 Gy in 25 fractions for a total dose of 45 Gy                      2.  Sidewall Boost, 1.8 Gy in 5 fractions for a total dose of 9 Gy                      3. Nodal boost  2, 1.8 Gy in 2 fractions for a total dose of 3.6 Gy                      4. Cervix, 5.5 Gy (11/06/17), 6 Gy (11/08/17), 6 Gy (11/13/17), 5.5 Gy (11/15/17), 5.5 Gy (11/20/17)  Beams/energy:   1. 3D, 15X                              2. 3D, 15X                              3. 3D, 15X                              4. Brachytherapy via tandem-ring, [id number]      12/27/2017 PET scan    1. Interval mixed response to therapy. Interval decrease in hypermetabolism associated with the cervical mass. Left pelvic sidewall hypermetabolic lymph node seen previously has resolved. 2. Interval progression of bilateral pulmonary nodules with demonstrable FDG accumulation in these nodules on today's study. Imaging features are compatible with metastatic disease. There is a new 3 mm nodule in the left upper lobe. 3. Focus of FDG accumulation in the left adnexal region, likely related to the left ovary but tracks up along the left side of the uterus. This is indeterminate and may be related to the ovary although metastatic involvement is not excluded. Close attention on follow-up recommended. Pelvic MRI without and with contrast may possibly provide additional insight as clinically warranted.      01/24/2018 -  Chemotherapy    The patient had carboplatin and taxol for chemotherapy treatment.        03/21/2018 PET scan    1. Stable exam with no clear trend towards improving or progressing disease. The bilateral pulmonary nodules measure similar in size today and show stable to minimal decrease in hypermetabolism since prior study. 2. Stable appearance of hypermetabolism in the left adnexal space, potentially related to the ovary. Continued attention on follow-up recommended. 3. No new or progressive hypermetabolic disease identified on today's study.       Metastasis to lung ([place])   01/15/2018 Initial Diagnosis    Metastasis to lung ([place])      01/21/2018 -  Chemotherapy    The patient had palonosetron (ALOXI) injection 0.25 mg, 0.25 mg, Intravenous,  Once, 4 of 6 cycles Administration: 0.25 mg (01/24/2018), 0.25 mg (02/14/2018), 0.25 mg (03/07/2018) CARBOplatin (PARAPLATIN) 420 mg in sodium chloride 0.9  % 250 mL chemo infusion, 420 mg (100 % of original dose 417 mg), Intravenous,  Once, 4 of 6 cycles Dose modification:   (original dose 417 mg, Cycle 1), 420 mg (original dose 417 mg, Cycle 2) Administration: 420 mg (01/24/2018), 420 mg (02/14/2018), 420 mg (03/07/2018) PACLitaxel (TAXOL) 312 mg in sodium chloride 0.9 % 500 mL chemo infusion (> 80mg /m2), 175 mg/m2 = 312 mg, Intravenous,  Once, 4 of 6 cycles Dose modification: 140 mg/m2 (80 % of original dose 175 mg/m2, Cycle 4, Reason: Dose Not Tolerated) Administration: 312 mg (01/24/2018), 312 mg (02/14/2018), 312 mg (03/07/2018)  for  chemotherapy treatment.        REVIEW OF SYSTEMS:   Constitutional: Denies fevers, chills or abnormal weight loss Eyes: Denies blurriness of vision Ears, nose, mouth, throat, and face: Denies mucositis or sore throat Respiratory: Denies cough, dyspnea or wheezes Cardiovascular: Denies palpitation, chest discomfort or lower extremity swelling Gastrointestinal:  Denies nausea, heartburn or change in bowel habits Skin: Denies abnormal skin rashes Lymphatics: Denies new lymphadenopathy or easy bruising Neurological:Denies numbness, tingling or new weaknesses Behavioral/Psych: Mood is stable, no new changes  All other systems were reviewed with the patient and are negative.  I have reviewed the past medical history, past surgical history, social history and family history with the patient and they are unchanged from previous note.  ALLERGIES:  is allergic to tegaderm ag mesh [silver].  MEDICATIONS:  Current Outpatient Medications  Medication Sig Dispense Refill  . dexamethasone (DECADRON) 4 MG tablet Take 5 tabs the night before and 5 tabs in the morning of chemotherapy, every 3 weeks, with food (Patient not taking: Reported on 03/22/2018) 60 tablet 1  . lidocaine-prilocaine (EMLA) cream Apply to affected area once (Patient not taking: Reported on 03/22/2018) 30 g 3  . ondansetron (ZOFRAN) 8 MG tablet Take 1 tablet (8  mg total) by mouth every 8 (eight) hours as needed for refractory nausea / vomiting. Start on day 3 after chemo. (Patient not taking: Reported on 03/22/2018) 30 tablet 1  . prochlorperazine (COMPAZINE) 10 MG tablet Take 1 tablet (10 mg total) by mouth every 6 (six) hours as needed (Nausea or vomiting). (Patient not taking: Reported on 03/22/2018) 30 tablet 1   No current facility-administered medications for this visit.    Facility-Administered Medications Ordered in Other Visits  Medication Dose Route Frequency Provider Last Rate Last Dose  . CARBOplatin (PARAPLATIN) 420 mg in sodium chloride 0.9 % 250 mL chemo infusion  420 mg Intravenous Once Alvy Bimler, Trea Latner, MD      . heparin lock flush 100 unit/mL  500 Units Intracatheter Once PRN Alvy Bimler, Loukisha Gunnerson, MD      . PACLitaxel (TAXOL) 246 mg in sodium chloride 0.9 % 250 mL chemo infusion (> 80mg /m2)  140 mg/m2 (Treatment Plan Recorded) Intravenous Once Heath Lark, MD 97 mL/hr at 03/28/18 1121 246 mg at 03/28/18 1121  . sodium chloride flush (NS) 0.9 % injection 10 mL  10 mL Intracatheter PRN Alvy Bimler, Jett Kulzer, MD        PHYSICAL EXAMINATION: ECOG PERFORMANCE STATUS: 1 - Symptomatic but completely ambulatory  Vitals:   03/28/18 0915  BP: (!) 141/74  Pulse: (!) 117  Resp: 18  Temp: 98 F (36.7 C)  SpO2: 100%   Filed Weights   03/28/18 0915  Weight: 142 lb 6.4 oz (64.6 kg)    GENERAL:alert, no distress and comfortable SKIN: skin color, texture, turgor are normal, no rashes or significant lesions EYES: normal, Conjunctiva are pink and non-injected, sclera clear OROPHARYNX:no exudate, no erythema and lips, buccal mucosa, and tongue normal  NECK: supple, thyroid normal size, non-tender, without nodularity LYMPH:  no palpable lymphadenopathy in the cervical, axillary or inguinal LUNGS: clear to auscultation and percussion with normal breathing effort HEART: regular rate & rhythm and no murmurs and no lower extremity edema ABDOMEN:abdomen soft, non-tender  and normal bowel sounds Musculoskeletal:no cyanosis of digits and no clubbing  NEURO: alert & oriented x 3 with fluent speech, no focal motor/sensory deficits  LABORATORY DATA:  I have reviewed the data as listed    Component Value Date/Time   NA  140 03/28/2018 0856   NA 141 10/15/2017 0900   K 4.1 03/28/2018 0856   K 4.2 10/15/2017 0900   CL 104 03/28/2018 0856   CO2 26 03/28/2018 0856   CO2 28 10/15/2017 0900   GLUCOSE 160 (H) 03/28/2018 0856   GLUCOSE 105 10/15/2017 0900   BUN 20 03/28/2018 0856   BUN 10.8 10/15/2017 0900   CREATININE 0.93 03/28/2018 0856   CREATININE 1.1 10/15/2017 0900   CALCIUM 10.1 03/28/2018 0856   CALCIUM 9.3 10/15/2017 0900   PROT 7.5 03/28/2018 0856   PROT 7.1 10/15/2017 0900   ALBUMIN 4.1 03/28/2018 0856   ALBUMIN 3.7 10/15/2017 0900   AST 19 03/28/2018 0856   AST 17 10/15/2017 0900   ALT 13 03/28/2018 0856   ALT 19 10/15/2017 0900   ALKPHOS 97 03/28/2018 0856   ALKPHOS 95 10/15/2017 0900   BILITOT 0.3 03/28/2018 0856   BILITOT 0.29 10/15/2017 0900   GFRNONAA >60 03/28/2018 0856   GFRAA >60 03/28/2018 0856    No results found for: SPEP, UPEP  Lab Results  Component Value Date   WBC 2.9 (L) 03/28/2018   NEUTROABS 2.8 03/28/2018   HGB 10.4 (L) 03/28/2018   HCT 30.8 (L) 03/28/2018   MCV 91.5 03/28/2018   PLT 135 (L) 03/28/2018      Chemistry      Component Value Date/Time   NA 140 03/28/2018 0856   NA 141 10/15/2017 0900   K 4.1 03/28/2018 0856   K 4.2 10/15/2017 0900   CL 104 03/28/2018 0856   CO2 26 03/28/2018 0856   CO2 28 10/15/2017 0900   BUN 20 03/28/2018 0856   BUN 10.8 10/15/2017 0900   CREATININE 0.93 03/28/2018 0856   CREATININE 1.1 10/15/2017 0900      Component Value Date/Time   CALCIUM 10.1 03/28/2018 0856   CALCIUM 9.3 10/15/2017 0900   ALKPHOS 97 03/28/2018 0856   ALKPHOS 95 10/15/2017 0900   AST 19 03/28/2018 0856   AST 17 10/15/2017 0900   ALT 13 03/28/2018 0856   ALT 19 10/15/2017 0900   BILITOT 0.3  03/28/2018 0856   BILITOT 0.29 10/15/2017 0900       RADIOGRAPHIC STUDIES: I have personally reviewed the radiological images as listed and agreed with the findings in the report. Nm Pet Image Restag (ps) Skull Base To Thigh  Result Date: 03/21/2018 CLINICAL DATA:  Subsequent treatment strategy for cervical cancer. EXAM: NUCLEAR MEDICINE PET SKULL BASE TO THIGH TECHNIQUE: 6.7 mCi F-18 FDG was injected intravenously. Full-ring PET imaging was performed from the skull base to thigh after the radiotracer. CT data was obtained and used for attenuation correction and anatomic localization. Fasting blood glucose: 101 mg/dl COMPARISON:  12/26/2017 FINDINGS: Mediastinal blood pool activity: SUV max 2.6 NECK: No hypermetabolic lymph nodes in the neck. Incidental CT findings: Stable 3.6 cm low-attenuation right thyroid nodule with additional small nodules in the left lobe and isthmus. CHEST: Scattered bilateral pulmonary nodules are again identified. Continued slight progression evident. Index nodule left upper lobe (image 19/series 8) measures 7 mm today compared to 6 mm previously. SUV max = 2.1 today compared to 2.9 previously. Index posterior right upper lobe nodule (18/8) measures 8 mm today compared to 6 mm previously. SUV max = is 1.8 today compared to 2.5 previously. 3rd index nodule identified previously in the anterior right upper lobe (35/8 today) measures 5 mm today compared to 6 mm previously. SUV max = 0.7 on today's study. Scattered additional tiny  bilateral pulmonary nodules again noted. No definite new nodule or mass evident. Incidental CT findings: Right Port-A-Cath tip is positioned in the distal SVC. ABDOMEN/PELVIS: No abnormal hypermetabolic activity within the liver, pancreas, adrenal glands, or spleen. No hypermetabolic lymph nodes in the abdomen or pelvis. Similar hypermetabolism is identified in the left adnexal space, mapping to a 3.3 x 1.9 cm soft tissue structure (166/4). This is similar  to prior study and likely represents the left ovary although continued close attention on follow-up recommended. Tiny nonenlarged left pelvic sidewall lymph node (161/4) is stable. Incidental CT findings: Trace fluid is seen in the right paracolic gutter and cul-de-sac. There is some edema in the soft tissues of the pelvic floor, likely related to radiation therapy. SKELETON: Diffuse, mildly increased FDG uptake in the marrow space compared to prior study. Appearance presumably related to stimulatory marrow effects of treatment. Incidental CT findings: No worrisome lytic or sclerotic osseous abnormality. IMPRESSION: 1. Stable exam with no clear trend towards improving or progressing disease. The bilateral pulmonary nodules measure similar in size today and show stable to minimal decrease in hypermetabolism since prior study. 2. Stable appearance of hypermetabolism in the left adnexal space, potentially related to the ovary. Continued attention on follow-up recommended. 3. No new or progressive hypermetabolic disease identified on today's study. Electronically Signed   By: Misty Stanley M.D.   On: 03/21/2018 13:38    All questions were answered. The patient knows to call the clinic with any problems, questions or concerns. No barriers to learning was detected.  I spent 25 minutes counseling the patient face to face. The total time spent in the appointment was 40 minutes and more than 50% was on counseling and review of test results  Heath Lark, MD 03/28/2018 1:05 PM

## 2018-03-28 NOTE — Telephone Encounter (Signed)
Patient scheduled per 6/13 los. Patient will receive updated copy of schedule in treatment area.

## 2018-03-28 NOTE — Assessment & Plan Note (Signed)
She has chronic pancytopenia We would proceed with treatment with G-CSF support She is currently not symptomatic 

## 2018-03-28 NOTE — Assessment & Plan Note (Signed)
She is currently not symptomatic Recent imaging studies showed mild reduction in hypermetabolic activity with no new lesions I plan to repeat imaging study after 6 cycles of treatment 

## 2018-03-28 NOTE — Assessment & Plan Note (Signed)
She tolerated chemotherapy well except for pancytopenia and peripheral neuropathy Recommend minor dose adjustment of Taxol due to neuropathy I recommend repeat imaging study after 6 cycles of treatment

## 2018-03-28 NOTE — Assessment & Plan Note (Signed)
she has mild peripheral neuropathy, likely related to side effects of treatment. °I plan to reduce the dose of treatment as outlined above.  °I explained to the patient the rationale of this strategy and reassured the patient it would not compromise the efficacy of treatment ° °

## 2018-03-28 NOTE — Patient Instructions (Signed)
[facility] Discharge Instructions for Patients Receiving Chemotherapy  Today you received the following chemotherapy agents taxol/carboplatin  To help prevent nausea and vomiting after your treatment, we encourage you to take your nausea medication as directed   If you develop nausea and vomiting that is not controlled by your nausea medication, call the clinic.   BELOW ARE SYMPTOMS THAT SHOULD BE REPORTED IMMEDIATELY:  *FEVER GREATER THAN 100.5 F  *CHILLS WITH OR WITHOUT FEVER  NAUSEA AND VOMITING THAT IS NOT CONTROLLED WITH YOUR NAUSEA MEDICATION  *UNUSUAL SHORTNESS OF BREATH  *UNUSUAL BRUISING OR BLEEDING  TENDERNESS IN MOUTH AND THROAT WITH OR WITHOUT PRESENCE OF ULCERS  *URINARY PROBLEMS  *BOWEL PROBLEMS  UNUSUAL RASH Items with * indicate a potential emergency and should be followed up as soon as possible.  Feel free to call the clinic you have any questions or concerns. The clinic phone number is [phone].  

## 2018-03-30 ENCOUNTER — Inpatient Hospital Stay: Payer: Medicare HMO

## 2018-03-30 VITALS — BP 138/63 | HR 66 | Temp 98.3°F | Resp 16

## 2018-03-30 DIAGNOSIS — C530 Malignant neoplasm of endocervix: Secondary | ICD-10-CM

## 2018-03-30 DIAGNOSIS — Z5111 Encounter for antineoplastic chemotherapy: Secondary | ICD-10-CM | POA: Diagnosis not present

## 2018-03-30 MED ORDER — PEGFILGRASTIM-CBQV 6 MG/0.6ML ~~LOC~~ SOSY
6.0000 mg | PREFILLED_SYRINGE | Freq: Once | SUBCUTANEOUS | Status: AC
  Administered 2018-03-30: 6 mg via SUBCUTANEOUS

## 2018-03-30 MED ORDER — PEGFILGRASTIM-CBQV 6 MG/0.6ML ~~LOC~~ SOSY
PREFILLED_SYRINGE | SUBCUTANEOUS | Status: AC
  Filled 2018-03-30: qty 0.6

## 2018-03-30 NOTE — Patient Instructions (Signed)
Pegfilgrastim injection ([place]) What is this medicine? PEGFILGRASTIM (PEG fil gra stim) is a long-acting granulocyte colony-stimulating factor that stimulates the growth of neutrophils, a type of white blood cell important in the body's fight against infection. It is used to reduce the incidence of fever and infection in patients with certain types of cancer who are receiving chemotherapy that affects the bone marrow, and to increase survival after being exposed to high doses of radiation. This medicine may be used for other purposes; ask your health care provider or pharmacist if you have questions. COMMON BRAND NAME(S): Neulasta What should I tell my health care provider before I take this medicine? They need to know if you have any of these conditions: -kidney disease -latex allergy -ongoing radiation therapy -sickle cell disease -skin reactions to acrylic adhesives (On-Body Injector only) -an unusual or allergic reaction to pegfilgrastim, filgrastim, other medicines, foods, dyes, or preservatives -pregnant or trying to get pregnant -breast-feeding How should I use this medicine? This medicine is for injection under the skin. If you get this medicine at home, you will be taught how to prepare and give the pre-filled syringe or how to use the On-body Injector. Refer to the patient Instructions for Use for detailed instructions. Use exactly as directed. Tell your healthcare provider immediately if you suspect that the On-body Injector may not have performed as intended or if you suspect the use of the On-body Injector resulted in a missed or partial dose. It is important that you put your used needles and syringes in a special sharps container. Do not put them in a trash can. If you do not have a sharps container, call your pharmacist or healthcare provider to get one. Talk to your pediatrician regarding the use of this medicine in children. While this drug may be prescribed for selected  conditions, precautions do apply. Overdosage: If you think you have taken too much of this medicine contact a poison control center or emergency room at once. NOTE: This medicine is only for you. Do not share this medicine with others. What if I miss a dose? It is important not to miss your dose. Call your doctor or health care professional if you miss your dose. If you miss a dose due to an On-body Injector failure or leakage, a new dose should be administered as soon as possible using a single prefilled syringe for manual use. What may interact with this medicine? Interactions have not been studied. Give your health care provider a list of all the medicines, herbs, non-prescription drugs, or dietary supplements you use. Also tell them if you smoke, drink alcohol, or use illegal drugs. Some items may interact with your medicine. This list may not describe all possible interactions. Give your health care provider a list of all the medicines, herbs, non-prescription drugs, or dietary supplements you use. Also tell them if you smoke, drink alcohol, or use illegal drugs. Some items may interact with your medicine. What should I watch for while using this medicine? You may need blood work done while you are taking this medicine. If you are going to need a MRI, CT scan, or other procedure, tell your doctor that you are using this medicine (On-Body Injector only). What side effects may I notice from receiving this medicine? Side effects that you should report to your doctor or health care professional as soon as possible: -allergic reactions like skin rash, itching or hives, swelling of the face, lips, or tongue -dizziness -fever -pain, redness, or irritation at   site where injected -pinpoint red spots on the skin -red or dark-brown urine -shortness of breath or breathing problems -stomach or side pain, or pain at the shoulder -swelling -tiredness -trouble passing urine or change in the amount of  urine Side effects that usually do not require medical attention (report to your doctor or health care professional if they continue or are bothersome): -bone pain -muscle pain This list may not describe all possible side effects. Call your doctor for medical advice about side effects. You may report side effects to FDA at 1-800-FDA-1088. Where should I keep my medicine? Keep out of the reach of children. Store pre-filled syringes in a refrigerator between 2 and 8 degrees C (36 and 46 degrees F). Do not freeze. Keep in carton to protect from light. Throw away this medicine if it is left out of the refrigerator for more than 48 hours. Throw away any unused medicine after the expiration date. NOTE: This sheet is a summary. It may not cover all possible information. If you have questions about this medicine, talk to your doctor, pharmacist, or health care provider.  2018 Elsevier/Gold Standard (2016-09-28 12:58:03)  

## 2018-04-19 ENCOUNTER — Inpatient Hospital Stay: Payer: Medicare HMO

## 2018-04-19 ENCOUNTER — Encounter: Payer: Self-pay | Admitting: Hematology and Oncology

## 2018-04-19 ENCOUNTER — Inpatient Hospital Stay: Payer: Medicare HMO | Attending: Hematology and Oncology

## 2018-04-19 ENCOUNTER — Inpatient Hospital Stay (HOSPITAL_BASED_OUTPATIENT_CLINIC_OR_DEPARTMENT_OTHER): Payer: Medicare HMO | Admitting: Hematology and Oncology

## 2018-04-19 VITALS — HR 110

## 2018-04-19 DIAGNOSIS — Z79899 Other long term (current) drug therapy: Secondary | ICD-10-CM

## 2018-04-19 DIAGNOSIS — C530 Malignant neoplasm of endocervix: Secondary | ICD-10-CM

## 2018-04-19 DIAGNOSIS — C541 Malignant neoplasm of endometrium: Secondary | ICD-10-CM | POA: Insufficient documentation

## 2018-04-19 DIAGNOSIS — K5900 Constipation, unspecified: Secondary | ICD-10-CM | POA: Insufficient documentation

## 2018-04-19 DIAGNOSIS — Z923 Personal history of irradiation: Secondary | ICD-10-CM

## 2018-04-19 DIAGNOSIS — C7802 Secondary malignant neoplasm of left lung: Secondary | ICD-10-CM | POA: Diagnosis not present

## 2018-04-19 DIAGNOSIS — C7800 Secondary malignant neoplasm of unspecified lung: Secondary | ICD-10-CM

## 2018-04-19 DIAGNOSIS — R918 Other nonspecific abnormal finding of lung field: Secondary | ICD-10-CM

## 2018-04-19 DIAGNOSIS — C539 Malignant neoplasm of cervix uteri, unspecified: Secondary | ICD-10-CM

## 2018-04-19 DIAGNOSIS — Z7689 Persons encountering health services in other specified circumstances: Secondary | ICD-10-CM | POA: Insufficient documentation

## 2018-04-19 DIAGNOSIS — C7801 Secondary malignant neoplasm of right lung: Secondary | ICD-10-CM | POA: Diagnosis not present

## 2018-04-19 DIAGNOSIS — K5909 Other constipation: Secondary | ICD-10-CM

## 2018-04-19 DIAGNOSIS — Z5111 Encounter for antineoplastic chemotherapy: Secondary | ICD-10-CM | POA: Insufficient documentation

## 2018-04-19 DIAGNOSIS — D61818 Other pancytopenia: Secondary | ICD-10-CM | POA: Insufficient documentation

## 2018-04-19 DIAGNOSIS — T451X5A Adverse effect of antineoplastic and immunosuppressive drugs, initial encounter: Secondary | ICD-10-CM

## 2018-04-19 DIAGNOSIS — G620 Drug-induced polyneuropathy: Secondary | ICD-10-CM

## 2018-04-19 LAB — COMPREHENSIVE METABOLIC PANEL
ALK PHOS: 82 U/L (ref 38–126)
ALT: 16 U/L (ref 0–44)
ANION GAP: 10 (ref 5–15)
AST: 23 U/L (ref 15–41)
Albumin: 3.9 g/dL (ref 3.5–5.0)
BUN: 14 mg/dL (ref 8–23)
CALCIUM: 9.7 mg/dL (ref 8.9–10.3)
CO2: 28 mmol/L (ref 22–32)
CREATININE: 0.91 mg/dL (ref 0.44–1.00)
Chloride: 104 mmol/L (ref 98–111)
Glucose, Bld: 149 mg/dL — ABNORMAL HIGH (ref 70–99)
Potassium: 4 mmol/L (ref 3.5–5.1)
SODIUM: 142 mmol/L (ref 135–145)
TOTAL PROTEIN: 7.6 g/dL (ref 6.5–8.1)
Total Bilirubin: 0.4 mg/dL (ref 0.3–1.2)

## 2018-04-19 LAB — CBC WITH DIFFERENTIAL/PLATELET
Basophils Absolute: 0 10*3/uL (ref 0.0–0.1)
Basophils Relative: 0 %
EOS ABS: 0 10*3/uL (ref 0.0–0.5)
EOS PCT: 0 %
HCT: 28 % — ABNORMAL LOW (ref 34.8–46.6)
HEMOGLOBIN: 9.5 g/dL — AB (ref 11.6–15.9)
LYMPHS ABS: 0.1 10*3/uL — AB (ref 0.9–3.3)
LYMPHS PCT: 4 %
MCH: 31.6 pg (ref 25.1–34.0)
MCHC: 34 g/dL (ref 31.5–36.0)
MCV: 92.9 fL (ref 79.5–101.0)
Monocytes Absolute: 0 10*3/uL — ABNORMAL LOW (ref 0.1–0.9)
Monocytes Relative: 1 %
NEUTROS PCT: 95 %
Neutro Abs: 2.8 10*3/uL (ref 1.5–6.5)
Platelets: 106 10*3/uL — ABNORMAL LOW (ref 145–400)
RBC: 3.02 MIL/uL — ABNORMAL LOW (ref 3.70–5.45)
RDW: 18.3 % — ABNORMAL HIGH (ref 11.2–14.5)
WBC: 2.9 10*3/uL — ABNORMAL LOW (ref 3.9–10.3)

## 2018-04-19 MED ORDER — DIPHENHYDRAMINE HCL 50 MG/ML IJ SOLN
INTRAMUSCULAR | Status: AC
  Filled 2018-04-19: qty 1

## 2018-04-19 MED ORDER — PALONOSETRON HCL INJECTION 0.25 MG/5ML
0.2500 mg | Freq: Once | INTRAVENOUS | Status: AC
  Administered 2018-04-19: 0.25 mg via INTRAVENOUS

## 2018-04-19 MED ORDER — SODIUM CHLORIDE 0.9 % IV SOLN
140.0000 mg/m2 | Freq: Once | INTRAVENOUS | Status: AC
  Administered 2018-04-19: 246 mg via INTRAVENOUS
  Filled 2018-04-19: qty 41

## 2018-04-19 MED ORDER — FAMOTIDINE IN NACL 20-0.9 MG/50ML-% IV SOLN
INTRAVENOUS | Status: AC
  Filled 2018-04-19: qty 50

## 2018-04-19 MED ORDER — PALONOSETRON HCL INJECTION 0.25 MG/5ML
INTRAVENOUS | Status: AC
  Filled 2018-04-19: qty 5

## 2018-04-19 MED ORDER — FAMOTIDINE IN NACL 20-0.9 MG/50ML-% IV SOLN
20.0000 mg | Freq: Once | INTRAVENOUS | Status: AC
  Administered 2018-04-19: 20 mg via INTRAVENOUS

## 2018-04-19 MED ORDER — SODIUM CHLORIDE 0.9% FLUSH
10.0000 mL | INTRAVENOUS | Status: DC | PRN
  Administered 2018-04-19: 10 mL
  Filled 2018-04-19: qty 10

## 2018-04-19 MED ORDER — SODIUM CHLORIDE 0.9 % IV SOLN
Freq: Once | INTRAVENOUS | Status: AC
  Administered 2018-04-19: 10:00:00 via INTRAVENOUS

## 2018-04-19 MED ORDER — DIPHENHYDRAMINE HCL 50 MG/ML IJ SOLN
50.0000 mg | Freq: Once | INTRAMUSCULAR | Status: AC
  Administered 2018-04-19: 50 mg via INTRAVENOUS

## 2018-04-19 MED ORDER — SODIUM CHLORIDE 0.9 % IV SOLN
20.0000 mg | Freq: Once | INTRAVENOUS | Status: AC
  Administered 2018-04-19: 20 mg via INTRAVENOUS
  Filled 2018-04-19: qty 2

## 2018-04-19 MED ORDER — SODIUM CHLORIDE 0.9% FLUSH
10.0000 mL | Freq: Once | INTRAVENOUS | Status: AC
  Administered 2018-04-19: 10 mL
  Filled 2018-04-19: qty 10

## 2018-04-19 MED ORDER — HEPARIN SOD (PORK) LOCK FLUSH 100 UNIT/ML IV SOLN
500.0000 [IU] | Freq: Once | INTRAVENOUS | Status: AC | PRN
  Administered 2018-04-19: 500 [IU]
  Filled 2018-04-19: qty 5

## 2018-04-19 MED ORDER — SODIUM CHLORIDE 0.9 % IV SOLN
420.0000 mg | Freq: Once | INTRAVENOUS | Status: AC
  Administered 2018-04-19: 420 mg via INTRAVENOUS
  Filled 2018-04-19: qty 42

## 2018-04-19 NOTE — Progress Notes (Signed)
OK to proceed with chemo with HR 110-112 per Dr Alvy Bimler.

## 2018-04-19 NOTE — Progress Notes (Signed)
[place] OFFICE PROGRESS NOTE  Patient Care Team: Cloward, [name], MD as PCP - General (Internal Medicine) Heath Lark, MD as Consulting Physician (Hematology and Oncology) Gery Pray, MD as Consulting Physician (Radiation Oncology) Marti Sleigh, MD as Attending Physician (Gynecology)  ASSESSMENT & PLAN:  Malignant neoplasm of cervix [facility]) She tolerated chemotherapy well except for pancytopenia and peripheral neuropathy Recommend continue on minor dose adjustment of Taxol due to neuropathy I recommend repeat imaging study after 6 cycles of treatment  Metastasis to lung [facility]) She is currently not symptomatic Recent imaging studies showed mild reduction in hypermetabolic activity with no new lesions I plan to repeat imaging study after 6 cycles of treatment  Pancytopenia, acquired ([place]) She has chronic pancytopenia We would proceed with treatment with G-CSF support She is currently not symptomatic  Peripheral neuropathy due to chemotherapy [facility]) she has mild peripheral neuropathy, likely related to side effects of treatment. I plan to continue on reduced dose of treatment    Other constipation She has chronic intermittent constipation likely exacerbated by side effects of treatment We discussed laxative therapy I suspect her nausea could be related to her recent constipation   No orders of the defined types were placed in this encounter.   INTERVAL HISTORY: Please see below for problem oriented charting. She returns for further chemotherapy and follow-up Peripheral neuropathy about the same She complained of mild constipation and one episode of nausea No recent vomiting Denies recent infection, fever or chills No recent cough  SUMMARY OF ONCOLOGIC HISTORY:   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[place])   07/04/2017 Initial Diagnosis    She presented to the GYN clinic with postmenopausal bleeding. Examination revealed abnormal cervix. Vaginal  ultrasound showed a 10 cm uterus with an intramural fibroid, left ovary is slightly enlarged measuring 2.1 x 1.5 x 2.3 cm which is described as solid complex with a vascular flow. In addition the cervix is imaged showing a solid vascular mass measuring 2.7 x 2.1 x 2.2 cm. A sonohysterogram showed no abnormalities in the endometrium. Patient had a Pap smear showing high-grade squamous intraepithelial lesion with features suspicious for invasion as well as atypical glandular cells. 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      08/29/2017 PET scan    1. Cervical malignancy with maximum SUV 6.0, dominant cervical mass measuring 6.2 by 6.0 by 7.3 cm. 2. 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 3. There 12 scattered small pulmonary nodules measuring up to 5 mm in diameter. No hypermetabolic activity but these are below sensitive PET-CT size thresholds. These could be postinflammatory or less likely neoplastic, surveillance is recommended. 4. Hypodense thyroid nodules are not hypermetabolic which strongly favors benign etiology.      09/10/2017 Procedure    Placement of a subcutaneous port device.      09/12/2017 - 10/10/2017 Chemotherapy    The patient had weekly cisplatin for chemotherapy treatment.       09/12/2017 - 11/20/2017 Radiation Therapy    Radiation treatment dates:  External beam: 09/12/2017-10/29/2017, brachytherapy: 11/06/2017, 11/08/2017, 11/13/2017, 11/15/2017, 11/20/2017  Site/dose:   1. pelvis, 1.8 Gy in 25 fractions for a total dose of 45 Gy                      2. Sidewall Boost, 1.8 Gy in 5 fractions for a total dose of  9 Gy                      3. Nodal boost  2, 1.8 Gy in 2 fractions for a total dose of 3.6 Gy                      4. Cervix, 5.5 Gy (11/06/17), 6 Gy (11/08/17), 6 Gy (11/13/17), 5.5 Gy (11/15/17), 5.5 Gy (11/20/17)  Beams/energy:  1. 3D,  15X                              2. 3D, 15X                              3. 3D, 15X                              4. Brachytherapy via tandem-ring, [id number]      12/27/2017 PET scan    1. Interval mixed response to therapy. Interval decrease in hypermetabolism associated with the cervical mass. Left pelvic sidewall hypermetabolic lymph node seen previously has resolved. 2. Interval progression of bilateral pulmonary nodules with demonstrable FDG accumulation in these nodules on today's study. Imaging features are compatible with metastatic disease. There is a new 3 mm nodule in the left upper lobe. 3. Focus of FDG accumulation in the left adnexal region, likely related to the left ovary but tracks up along the left side of the uterus. This is indeterminate and may be related to the ovary although metastatic involvement is not excluded. Close attention on follow-up recommended. Pelvic MRI without and with contrast may possibly provide additional insight as clinically warranted.      01/24/2018 -  Chemotherapy    The patient had carboplatin and taxol for chemotherapy treatment.        03/21/2018 PET scan    1. Stable exam with no clear trend towards improving or progressing disease. The bilateral pulmonary nodules measure similar in size today and show stable to minimal decrease in hypermetabolism since prior study. 2. Stable appearance of hypermetabolism in the left adnexal space, potentially related to the ovary. Continued attention on follow-up recommended. 3. No new or progressive hypermetabolic disease identified on today's study.       Metastasis to lung ([place])   01/15/2018 Initial Diagnosis    Metastasis to lung ([place])      01/21/2018 -  Chemotherapy    The patient had palonosetron (ALOXI) injection 0.25 mg, 0.25 mg, Intravenous,  Once, 5 of 6 cycles Administration: 0.25 mg (01/24/2018), 0.25 mg (02/14/2018), 0.25 mg (03/07/2018), 0.25 mg (03/28/2018) CARBOplatin (PARAPLATIN) 420 mg in sodium  chloride 0.9 % 250 mL chemo infusion, 420 mg (100 % of original dose 417 mg), Intravenous,  Once, 5 of 6 cycles Dose modification:   (original dose 417 mg, Cycle 1), 420 mg (original dose 417 mg, Cycle 2) Administration: 420 mg (01/24/2018), 420 mg (02/14/2018), 420 mg (03/07/2018), 420 mg (03/28/2018) PACLitaxel (TAXOL) 312 mg in sodium chloride 0.9 % 500 mL chemo infusion (> 80mg /m2), 175 mg/m2 = 312 mg, Intravenous,  Once, 5 of 6 cycles Dose modification: 140 mg/m2 (80 % of original dose 175 mg/m2, Cycle 4, Reason: Dose Not Tolerated) Administration: 312 mg (01/24/2018), 312 mg (02/14/2018), 312 mg (03/07/2018), 246 mg (03/28/2018)  for chemotherapy treatment.  REVIEW OF SYSTEMS:   Constitutional: Denies fevers, chills or abnormal weight loss Eyes: Denies blurriness of vision Ears, nose, mouth, throat, and face: Denies mucositis or sore throat Respiratory: Denies cough, dyspnea or wheezes Cardiovascular: Denies palpitation, chest discomfort or lower extremity swelling Skin: Denies abnormal skin rashes Lymphatics: Denies new lymphadenopathy or easy bruising Neurological:Denies numbness, tingling or new weaknesses Behavioral/Psych: Mood is stable, no new changes  All other systems were reviewed with the patient and are negative.  I have reviewed the past medical history, past surgical history, social history and family history with the patient and they are unchanged from previous note.  ALLERGIES:  is allergic to tegaderm ag mesh [silver].  MEDICATIONS:  Current Outpatient Medications  Medication Sig Dispense Refill  . dexamethasone (DECADRON) 4 MG tablet Take 5 tabs the night before and 5 tabs in the morning of chemotherapy, every 3 weeks, with food (Patient not taking: Reported on 03/22/2018) 60 tablet 1  . lidocaine-prilocaine (EMLA) cream Apply to affected area once (Patient not taking: Reported on 03/22/2018) 30 g 3  . ondansetron (ZOFRAN) 8 MG tablet Take 1 tablet (8 mg total) by mouth  every 8 (eight) hours as needed for refractory nausea / vomiting. Start on day 3 after chemo. (Patient not taking: Reported on 03/22/2018) 30 tablet 1  . prochlorperazine (COMPAZINE) 10 MG tablet Take 1 tablet (10 mg total) by mouth every 6 (six) hours as needed (Nausea or vomiting). (Patient not taking: Reported on 03/22/2018) 30 tablet 1   No current facility-administered medications for this visit.    Facility-Administered Medications Ordered in Other Visits  Medication Dose Route Frequency Provider Last Rate Last Dose  . CARBOplatin (PARAPLATIN) 420 mg in sodium chloride 0.9 % 250 mL chemo infusion  420 mg Intravenous Once Alvy Bimler, Jontavius Rabalais, MD      . heparin lock flush 100 unit/mL  500 Units Intracatheter Once PRN Alvy Bimler, Shamanda Len, MD      . PACLitaxel (TAXOL) 246 mg in sodium chloride 0.9 % 250 mL chemo infusion (> 80mg /m2)  140 mg/m2 (Treatment Plan Recorded) Intravenous Once Brianni Manthe, MD      . sodium chloride flush (NS) 0.9 % injection 10 mL  10 mL Intracatheter PRN Alvy Bimler, Mackayla Mullins, MD        PHYSICAL EXAMINATION: ECOG PERFORMANCE STATUS: 1 - Symptomatic but completely ambulatory  Vitals:   04/19/18 0857  BP: 137/78  Pulse: (!) 112  Resp: 18  Temp: 98.2 F (36.8 C)  SpO2: 100%   Filed Weights   04/19/18 0857  Weight: 140 lb (63.5 kg)    GENERAL:alert, no distress and comfortable SKIN: skin color, texture, turgor are normal, no rashes or significant lesions EYES: normal, Conjunctiva are pink and non-injected, sclera clear OROPHARYNX:no exudate, no erythema and lips, buccal mucosa, and tongue normal  NECK: supple, thyroid normal size, non-tender, without nodularity LYMPH:  no palpable lymphadenopathy in the cervical, axillary or inguinal LUNGS: clear to auscultation and percussion with normal breathing effort HEART: regular rate & rhythm and no murmurs and no lower extremity edema ABDOMEN:abdomen soft, non-tender and normal bowel sounds Musculoskeletal:no cyanosis of digits and no  clubbing  NEURO: alert & oriented x 3 with fluent speech, no focal motor/sensory deficits  LABORATORY DATA:  I have reviewed the data as listed    Component Value Date/Time   NA 142 04/19/2018 0812   NA 141 10/15/2017 0900   K 4.0 04/19/2018 0812   K 4.2 10/15/2017 0900   CL 104 04/19/2018 1962  CO2 28 04/19/2018 0812   CO2 28 10/15/2017 0900   GLUCOSE 149 (H) 04/19/2018 0812   GLUCOSE 105 10/15/2017 0900   BUN 14 04/19/2018 0812   BUN 10.8 10/15/2017 0900   CREATININE 0.91 04/19/2018 0812   CREATININE 0.93 03/28/2018 0856   CREATININE 1.1 10/15/2017 0900   CALCIUM 9.7 04/19/2018 0812   CALCIUM 9.3 10/15/2017 0900   PROT 7.6 04/19/2018 0812   PROT 7.1 10/15/2017 0900   ALBUMIN 3.9 04/19/2018 0812   ALBUMIN 3.7 10/15/2017 0900   AST 23 04/19/2018 0812   AST 19 03/28/2018 0856   AST 17 10/15/2017 0900   ALT 16 04/19/2018 0812   ALT 13 03/28/2018 0856   ALT 19 10/15/2017 0900   ALKPHOS 82 04/19/2018 0812   ALKPHOS 95 10/15/2017 0900   BILITOT 0.4 04/19/2018 0812   BILITOT 0.3 03/28/2018 0856   BILITOT 0.29 10/15/2017 0900   GFRNONAA >60 04/19/2018 0812   GFRNONAA >60 03/28/2018 0856   GFRAA >60 04/19/2018 0812   GFRAA >60 03/28/2018 0856    No results found for: SPEP, UPEP  Lab Results  Component Value Date   WBC 2.9 (L) 04/19/2018   NEUTROABS 2.8 04/19/2018   HGB 9.5 (L) 04/19/2018   HCT 28.0 (L) 04/19/2018   MCV 92.9 04/19/2018   PLT 106 (L) 04/19/2018      Chemistry      Component Value Date/Time   NA 142 04/19/2018 0812   NA 141 10/15/2017 0900   K 4.0 04/19/2018 0812   K 4.2 10/15/2017 0900   CL 104 04/19/2018 0812   CO2 28 04/19/2018 0812   CO2 28 10/15/2017 0900   BUN 14 04/19/2018 0812   BUN 10.8 10/15/2017 0900   CREATININE 0.91 04/19/2018 0812   CREATININE 0.93 03/28/2018 0856   CREATININE 1.1 10/15/2017 0900      Component Value Date/Time   CALCIUM 9.7 04/19/2018 0812   CALCIUM 9.3 10/15/2017 0900   ALKPHOS 82 04/19/2018 0812    ALKPHOS 95 10/15/2017 0900   AST 23 04/19/2018 0812   AST 19 03/28/2018 0856   AST 17 10/15/2017 0900   ALT 16 04/19/2018 0812   ALT 13 03/28/2018 0856   ALT 19 10/15/2017 0900   BILITOT 0.4 04/19/2018 0812   BILITOT 0.3 03/28/2018 0856   BILITOT 0.29 10/15/2017 0900       RADIOGRAPHIC STUDIES: I have personally reviewed the radiological images as listed and agreed with the findings in the report. Nm Pet Image Restag (ps) Skull Base To Thigh  Result Date: 03/21/2018 CLINICAL DATA:  Subsequent treatment strategy for cervical cancer. EXAM: NUCLEAR MEDICINE PET SKULL BASE TO THIGH TECHNIQUE: 6.7 mCi F-18 FDG was injected intravenously. Full-ring PET imaging was performed from the skull base to thigh after the radiotracer. CT data was obtained and used for attenuation correction and anatomic localization. Fasting blood glucose: 101 mg/dl COMPARISON:  12/26/2017 FINDINGS: Mediastinal blood pool activity: SUV max 2.6 NECK: No hypermetabolic lymph nodes in the neck. Incidental CT findings: Stable 3.6 cm low-attenuation right thyroid nodule with additional small nodules in the left lobe and isthmus. CHEST: Scattered bilateral pulmonary nodules are again identified. Continued slight progression evident. Index nodule left upper lobe (image 19/series 8) measures 7 mm today compared to 6 mm previously. SUV max = 2.1 today compared to 2.9 previously. Index posterior right upper lobe nodule (18/8) measures 8 mm today compared to 6 mm previously. SUV max = is 1.8 today compared to 2.5 previously. 3rd  index nodule identified previously in the anterior right upper lobe (35/8 today) measures 5 mm today compared to 6 mm previously. SUV max = 0.7 on today's study. Scattered additional tiny bilateral pulmonary nodules again noted. No definite new nodule or mass evident. Incidental CT findings: Right Port-A-Cath tip is positioned in the distal SVC. ABDOMEN/PELVIS: No abnormal hypermetabolic activity within the liver,  pancreas, adrenal glands, or spleen. No hypermetabolic lymph nodes in the abdomen or pelvis. Similar hypermetabolism is identified in the left adnexal space, mapping to a 3.3 x 1.9 cm soft tissue structure (166/4). This is similar to prior study and likely represents the left ovary although continued close attention on follow-up recommended. Tiny nonenlarged left pelvic sidewall lymph node (161/4) is stable. Incidental CT findings: Trace fluid is seen in the right paracolic gutter and cul-de-sac. There is some edema in the soft tissues of the pelvic floor, likely related to radiation therapy. SKELETON: Diffuse, mildly increased FDG uptake in the marrow space compared to prior study. Appearance presumably related to stimulatory marrow effects of treatment. Incidental CT findings: No worrisome lytic or sclerotic osseous abnormality. IMPRESSION: 1. Stable exam with no clear trend towards improving or progressing disease. The bilateral pulmonary nodules measure similar in size today and show stable to minimal decrease in hypermetabolism since prior study. 2. Stable appearance of hypermetabolism in the left adnexal space, potentially related to the ovary. Continued attention on follow-up recommended. 3. No new or progressive hypermetabolic disease identified on today's study. Electronically Signed   By: Misty Stanley M.D.   On: 03/21/2018 13:38    All questions were answered. The patient knows to call the clinic with any problems, questions or concerns. No barriers to learning was detected.  I spent 15 minutes counseling the patient face to face. The total time spent in the appointment was 20 minutes and more than 50% was on counseling and review of test results  Heath Lark, MD 04/19/2018 11:38 AM

## 2018-04-19 NOTE — Assessment & Plan Note (Signed)
She has chronic pancytopenia We would proceed with treatment with G-CSF support She is currently not symptomatic

## 2018-04-19 NOTE — Assessment & Plan Note (Signed)
She tolerated chemotherapy well except for pancytopenia and peripheral neuropathy Recommend continue on minor dose adjustment of Taxol due to neuropathy I recommend repeat imaging study after 6 cycles of treatment

## 2018-04-19 NOTE — Assessment & Plan Note (Signed)
She has chronic intermittent constipation likely exacerbated by side effects of treatment We discussed laxative therapy I suspect her nausea could be related to her recent constipation

## 2018-04-19 NOTE — Patient Instructions (Signed)
Implanted Port Home Guide An implanted port is a type of central line that is placed under the skin. Central lines are used to provide IV access when treatment or nutrition needs to be given through a person's veins. Implanted ports are used for long-term IV access. An implanted port may be placed because:  You need IV medicine that would be irritating to the small veins in your hands or arms.  You need long-term IV medicines, such as antibiotics.  You need IV nutrition for a long period.  You need frequent blood draws for lab tests.  You need dialysis.  Implanted ports are usually placed in the chest area, but they can also be placed in the upper arm, the abdomen, or the leg. An implanted port has two main parts:  Reservoir. The reservoir is round and will appear as a small, raised area under your skin. The reservoir is the part where a needle is inserted to give medicines or draw blood.  Catheter. The catheter is a thin, flexible tube that extends from the reservoir. The catheter is placed into a large vein. Medicine that is inserted into the reservoir goes into the catheter and then into the vein.  How will I care for my incision site? Do not get the incision site wet. Bathe or shower as directed by your health care provider. How is my port accessed? Special steps must be taken to access the port:  Before the port is accessed, a numbing cream can be placed on the skin. This helps numb the skin over the port site.  Your health care provider uses a sterile technique to access the port. ? Your health care provider must put on a mask and sterile gloves. ? The skin over your port is cleaned carefully with an antiseptic and allowed to dry. ? The port is gently pinched between sterile gloves, and a needle is inserted into the port.  Only "non-coring" port needles should be used to access the port. Once the port is accessed, a blood return should be checked. This helps ensure that the port  is in the vein and is not clogged.  If your port needs to remain accessed for a constant infusion, a clear (transparent) bandage will be placed over the needle site. The bandage and needle will need to be changed every week, or as directed by your health care provider.  Keep the bandage covering the needle clean and dry. Do not get it wet. Follow your health care provider's instructions on how to take a shower or bath while the port is accessed.  If your port does not need to stay accessed, no bandage is needed over the port.  What is flushing? Flushing helps keep the port from getting clogged. Follow your health care provider's instructions on how and when to flush the port. Ports are usually flushed with saline solution or a medicine called heparin. The need for flushing will depend on how the port is used.  If the port is used for intermittent medicines or blood draws, the port will need to be flushed: ? After medicines have been given. ? After blood has been drawn. ? As part of routine maintenance.  If a constant infusion is running, the port may not need to be flushed.  How long will my port stay implanted? The port can stay in for as long as your health care provider thinks it is needed. When it is time for the port to come out, surgery will be   done to remove it. The procedure is similar to the one performed when the port was put in. When should I seek immediate medical care? When you have an implanted port, you should seek immediate medical care if:  You notice a bad smell coming from the incision site.  You have swelling, redness, or drainage at the incision site.  You have more swelling or pain at the port site or the surrounding area.  You have a fever that is not controlled with medicine.  This information is not intended to replace advice given to you by your health care provider. Make sure you discuss any questions you have with your health care provider. Document  Released: 10/02/2005 Document Revised: 03/09/2016 Document Reviewed: 06/09/2013 Elsevier Interactive Patient Education  2017 [employer]  

## 2018-04-19 NOTE — Assessment & Plan Note (Signed)
She is currently not symptomatic Recent imaging studies showed mild reduction in hypermetabolic activity with no new lesions I plan to repeat imaging study after 6 cycles of treatment

## 2018-04-19 NOTE — Patient Instructions (Signed)
[place] Discharge Instructions for Patients Receiving Chemotherapy  Today you received the following chemotherapy agents: Carboplatin and Taxol.  To help prevent nausea and vomiting after your treatment, we encourage you to take your nausea medication as directed  If you develop nausea and vomiting that is not controlled by your nausea medication, call the clinic.   BELOW ARE SYMPTOMS THAT SHOULD BE REPORTED IMMEDIATELY:  *FEVER GREATER THAN 100.5 F  *CHILLS WITH OR WITHOUT FEVER  NAUSEA AND VOMITING THAT IS NOT CONTROLLED WITH YOUR NAUSEA MEDICATION  *UNUSUAL SHORTNESS OF BREATH  *UNUSUAL BRUISING OR BLEEDING  TENDERNESS IN MOUTH AND THROAT WITH OR WITHOUT PRESENCE OF ULCERS  *URINARY PROBLEMS  *BOWEL PROBLEMS  UNUSUAL RASH Items with * indicate a potential emergency and should be followed up as soon as possible.  Feel free to call the clinic you have any questions or concerns. The clinic phone number is [phone].

## 2018-04-19 NOTE — Assessment & Plan Note (Signed)
she has mild peripheral neuropathy, likely related to side effects of treatment. I plan to continue on reduced dose of treatment

## 2018-04-22 ENCOUNTER — Inpatient Hospital Stay: Payer: Medicare HMO

## 2018-04-22 VITALS — BP 116/62 | HR 112 | Temp 98.5°F

## 2018-04-22 DIAGNOSIS — C530 Malignant neoplasm of endocervix: Secondary | ICD-10-CM

## 2018-04-22 DIAGNOSIS — Z5111 Encounter for antineoplastic chemotherapy: Secondary | ICD-10-CM | POA: Diagnosis not present

## 2018-04-22 MED ORDER — PEGFILGRASTIM-CBQV 6 MG/0.6ML ~~LOC~~ SOSY
6.0000 mg | PREFILLED_SYRINGE | Freq: Once | SUBCUTANEOUS | Status: AC
  Administered 2018-04-22: 6 mg via SUBCUTANEOUS

## 2018-04-22 NOTE — Patient Instructions (Signed)
Pegfilgrastim injection ([place]) What is this medicine? PEGFILGRASTIM (PEG fil gra stim) is a long-acting granulocyte colony-stimulating factor that stimulates the growth of neutrophils, a type of white blood cell important in the body's fight against infection. It is used to reduce the incidence of fever and infection in patients with certain types of cancer who are receiving chemotherapy that affects the bone marrow, and to increase survival after being exposed to high doses of radiation. This medicine may be used for other purposes; ask your health care provider or pharmacist if you have questions. COMMON BRAND NAME(S): Neulasta What should I tell my health care provider before I take this medicine? They need to know if you have any of these conditions: -kidney disease -latex allergy -ongoing radiation therapy -sickle cell disease -skin reactions to acrylic adhesives (On-Body Injector only) -an unusual or allergic reaction to pegfilgrastim, filgrastim, other medicines, foods, dyes, or preservatives -pregnant or trying to get pregnant -breast-feeding How should I use this medicine? This medicine is for injection under the skin. If you get this medicine at home, you will be taught how to prepare and give the pre-filled syringe or how to use the On-body Injector. Refer to the patient Instructions for Use for detailed instructions. Use exactly as directed. Tell your healthcare provider immediately if you suspect that the On-body Injector may not have performed as intended or if you suspect the use of the On-body Injector resulted in a missed or partial dose. It is important that you put your used needles and syringes in a special sharps container. Do not put them in a trash can. If you do not have a sharps container, call your pharmacist or healthcare provider to get one. Talk to your pediatrician regarding the use of this medicine in children. While this drug may be prescribed for selected  conditions, precautions do apply. Overdosage: If you think you have taken too much of this medicine contact a poison control center or emergency room at once. NOTE: This medicine is only for you. Do not share this medicine with others. What if I miss a dose? It is important not to miss your dose. Call your doctor or health care professional if you miss your dose. If you miss a dose due to an On-body Injector failure or leakage, a new dose should be administered as soon as possible using a single prefilled syringe for manual use. What may interact with this medicine? Interactions have not been studied. Give your health care provider a list of all the medicines, herbs, non-prescription drugs, or dietary supplements you use. Also tell them if you smoke, drink alcohol, or use illegal drugs. Some items may interact with your medicine. This list may not describe all possible interactions. Give your health care provider a list of all the medicines, herbs, non-prescription drugs, or dietary supplements you use. Also tell them if you smoke, drink alcohol, or use illegal drugs. Some items may interact with your medicine. What should I watch for while using this medicine? You may need blood work done while you are taking this medicine. If you are going to need a MRI, CT scan, or other procedure, tell your doctor that you are using this medicine (On-Body Injector only). What side effects may I notice from receiving this medicine? Side effects that you should report to your doctor or health care professional as soon as possible: -allergic reactions like skin rash, itching or hives, swelling of the face, lips, or tongue -dizziness -fever -pain, redness, or irritation at   site where injected -pinpoint red spots on the skin -red or dark-brown urine -shortness of breath or breathing problems -stomach or side pain, or pain at the shoulder -swelling -tiredness -trouble passing urine or change in the amount of  urine Side effects that usually do not require medical attention (report to your doctor or health care professional if they continue or are bothersome): -bone pain -muscle pain This list may not describe all possible side effects. Call your doctor for medical advice about side effects. You may report side effects to FDA at 1-800-FDA-1088. Where should I keep my medicine? Keep out of the reach of children. Store pre-filled syringes in a refrigerator between 2 and 8 degrees C (36 and 46 degrees F). Do not freeze. Keep in carton to protect from light. Throw away this medicine if it is left out of the refrigerator for more than 48 hours. Throw away any unused medicine after the expiration date. NOTE: This sheet is a summary. It may not cover all possible information. If you have questions about this medicine, talk to your doctor, pharmacist, or health care provider.  2018 Elsevier/Gold Standard (2016-09-28 12:58:03)  

## 2018-05-09 ENCOUNTER — Encounter: Payer: Self-pay | Admitting: Oncology

## 2018-05-09 ENCOUNTER — Inpatient Hospital Stay: Payer: Medicare HMO

## 2018-05-09 ENCOUNTER — Telehealth: Payer: Self-pay | Admitting: Hematology and Oncology

## 2018-05-09 ENCOUNTER — Inpatient Hospital Stay (HOSPITAL_BASED_OUTPATIENT_CLINIC_OR_DEPARTMENT_OTHER): Payer: Medicare HMO | Admitting: Hematology and Oncology

## 2018-05-09 DIAGNOSIS — C7801 Secondary malignant neoplasm of right lung: Secondary | ICD-10-CM | POA: Diagnosis not present

## 2018-05-09 DIAGNOSIS — Z5111 Encounter for antineoplastic chemotherapy: Secondary | ICD-10-CM | POA: Diagnosis not present

## 2018-05-09 DIAGNOSIS — Z923 Personal history of irradiation: Secondary | ICD-10-CM

## 2018-05-09 DIAGNOSIS — D61818 Other pancytopenia: Secondary | ICD-10-CM | POA: Diagnosis not present

## 2018-05-09 DIAGNOSIS — C530 Malignant neoplasm of endocervix: Secondary | ICD-10-CM

## 2018-05-09 DIAGNOSIS — Z79899 Other long term (current) drug therapy: Secondary | ICD-10-CM

## 2018-05-09 DIAGNOSIS — C539 Malignant neoplasm of cervix uteri, unspecified: Secondary | ICD-10-CM

## 2018-05-09 DIAGNOSIS — C541 Malignant neoplasm of endometrium: Secondary | ICD-10-CM | POA: Diagnosis not present

## 2018-05-09 DIAGNOSIS — R918 Other nonspecific abnormal finding of lung field: Secondary | ICD-10-CM

## 2018-05-09 DIAGNOSIS — C7802 Secondary malignant neoplasm of left lung: Secondary | ICD-10-CM | POA: Diagnosis not present

## 2018-05-09 DIAGNOSIS — K5900 Constipation, unspecified: Secondary | ICD-10-CM

## 2018-05-09 DIAGNOSIS — K5909 Other constipation: Secondary | ICD-10-CM

## 2018-05-09 LAB — COMPREHENSIVE METABOLIC PANEL WITH GFR
ALT: 10 U/L (ref 0–44)
AST: 16 U/L (ref 15–41)
Albumin: 3.9 g/dL (ref 3.5–5.0)
Alkaline Phosphatase: 92 U/L (ref 38–126)
Anion gap: 11 (ref 5–15)
BUN: 17 mg/dL (ref 8–23)
CO2: 26 mmol/L (ref 22–32)
Calcium: 10 mg/dL (ref 8.9–10.3)
Chloride: 102 mmol/L (ref 98–111)
Creatinine, Ser: 0.93 mg/dL (ref 0.44–1.00)
GFR calc Af Amer: >60 mL/min
GFR calc non Af Amer: >60 mL/min
Glucose, Bld: 171 mg/dL — ABNORMAL HIGH (ref 70–99)
Potassium: 3.9 mmol/L (ref 3.5–5.1)
Sodium: 139 mmol/L (ref 135–145)
Total Bilirubin: 0.4 mg/dL (ref 0.3–1.2)
Total Protein: 7.4 g/dL (ref 6.5–8.1)

## 2018-05-09 LAB — CBC WITH DIFFERENTIAL/PLATELET
BASOS ABS: 0 10*3/uL (ref 0.0–0.1)
Basophils Relative: 0 %
Eosinophils Absolute: 0 10*3/uL (ref 0.0–0.5)
Eosinophils Relative: 0 %
HEMATOCRIT: 25.1 % — AB (ref 34.8–46.6)
HEMOGLOBIN: 8.5 g/dL — AB (ref 11.6–15.9)
Lymphocytes Relative: 3 %
Lymphs Abs: 0.1 10*3/uL — ABNORMAL LOW (ref 0.9–3.3)
MCH: 32.6 pg (ref 25.1–34.0)
MCHC: 33.8 g/dL (ref 31.5–36.0)
MCV: 96.3 fL (ref 79.5–101.0)
Monocytes Absolute: 0 10*3/uL — ABNORMAL LOW (ref 0.1–0.9)
Monocytes Relative: 0 %
NEUTROS ABS: 2.8 10*3/uL (ref 1.5–6.5)
NEUTROS PCT: 97 %
Platelets: 56 10*3/uL — ABNORMAL LOW (ref 145–400)
RBC: 2.61 MIL/uL — AB (ref 3.70–5.45)
RDW: 18.6 % — ABNORMAL HIGH (ref 11.2–14.5)
WBC: 2.9 10*3/uL — AB (ref 3.9–10.3)

## 2018-05-09 MED ORDER — DEXAMETHASONE 4 MG PO TABS: ORAL_TABLET | ORAL | 1 refills | Status: DC

## 2018-05-09 MED ORDER — SODIUM CHLORIDE 0.9% FLUSH
10.0000 mL | Freq: Once | INTRAVENOUS | Status: AC
  Administered 2018-05-09: 10 mL
  Filled 2018-05-09: qty 10

## 2018-05-09 NOTE — Telephone Encounter (Signed)
Gave avs and calendar ° °

## 2018-05-09 NOTE — Patient Instructions (Signed)
Implanted Port Home Guide An implanted port is a type of central line that is placed under the skin. Central lines are used to provide IV access when treatment or nutrition needs to be given through a person's veins. Implanted ports are used for long-term IV access. An implanted port may be placed because:  You need IV medicine that would be irritating to the small veins in your hands or arms.  You need long-term IV medicines, such as antibiotics.  You need IV nutrition for a long period.  You need frequent blood draws for lab tests.  You need dialysis.  Implanted ports are usually placed in the chest area, but they can also be placed in the upper arm, the abdomen, or the leg. An implanted port has two main parts:  Reservoir. The reservoir is round and will appear as a small, raised area under your skin. The reservoir is the part where a needle is inserted to give medicines or draw blood.  Catheter. The catheter is a thin, flexible tube that extends from the reservoir. The catheter is placed into a large vein. Medicine that is inserted into the reservoir goes into the catheter and then into the vein.  How will I care for my incision site? Do not get the incision site wet. Bathe or shower as directed by your health care provider. How is my port accessed? Special steps must be taken to access the port:  Before the port is accessed, a numbing cream can be placed on the skin. This helps numb the skin over the port site.  Your health care provider uses a sterile technique to access the port. ? Your health care provider must put on a mask and sterile gloves. ? The skin over your port is cleaned carefully with an antiseptic and allowed to dry. ? The port is gently pinched between sterile gloves, and a needle is inserted into the port.  Only "non-coring" port needles should be used to access the port. Once the port is accessed, a blood return should be checked. This helps ensure that the port  is in the vein and is not clogged.  If your port needs to remain accessed for a constant infusion, a clear (transparent) bandage will be placed over the needle site. The bandage and needle will need to be changed every week, or as directed by your health care provider.  Keep the bandage covering the needle clean and dry. Do not get it wet. Follow your health care provider's instructions on how to take a shower or bath while the port is accessed.  If your port does not need to stay accessed, no bandage is needed over the port.  What is flushing? Flushing helps keep the port from getting clogged. Follow your health care provider's instructions on how and when to flush the port. Ports are usually flushed with saline solution or a medicine called heparin. The need for flushing will depend on how the port is used.  If the port is used for intermittent medicines or blood draws, the port will need to be flushed: ? After medicines have been given. ? After blood has been drawn. ? As part of routine maintenance.  If a constant infusion is running, the port may not need to be flushed.  How long will my port stay implanted? The port can stay in for as long as your health care provider thinks it is needed. When it is time for the port to come out, surgery will be   done to remove it. The procedure is similar to the one performed when the port was put in. When should I seek immediate medical care? When you have an implanted port, you should seek immediate medical care if:  You notice a bad smell coming from the incision site.  You have swelling, redness, or drainage at the incision site.  You have more swelling or pain at the port site or the surrounding area.  You have a fever that is not controlled with medicine.  This information is not intended to replace advice given to you by your health care provider. Make sure you discuss any questions you have with your health care provider. Document  Released: 10/02/2005 Document Revised: 03/09/2016 Document Reviewed: 06/09/2013 Elsevier Interactive Patient Education  2017 [employer]  

## 2018-05-10 ENCOUNTER — Encounter: Payer: Self-pay | Admitting: Hematology and Oncology

## 2018-05-10 NOTE — Assessment & Plan Note (Signed)
She has significant pancytopenia despite dose adjustment We will delay chemotherapy by 1 week She will continue growth factor support with each cycle She does not need blood or platelet transfusion I plan to reduce a dose of carboplatin a little bit

## 2018-05-10 NOTE — Assessment & Plan Note (Signed)
She is currently not symptomatic Recent imaging studies showed mild reduction in hypermetabolic activity with no new lesions I plan to repeat imaging study after 6 cycles of treatment

## 2018-05-10 NOTE — Assessment & Plan Note (Signed)
Unfortunately, despite dose adjustment, she is developing progressive, worsening pancytopenia I have to reschedule her chemotherapy to next week She will continue growth factor support with each cycle of treatment I plan to repeat PET CT scan before she sees her GYN oncologist in September for assessment of response to treatment

## 2018-05-10 NOTE — Progress Notes (Signed)
[place] OFFICE PROGRESS NOTE  Patient Care Team: Cloward, [name], MD as PCP - General (Internal Medicine) Heath Lark, MD as Consulting Physician (Hematology and Oncology) Gery Pray, MD as Consulting Physician (Radiation Oncology) Marti Sleigh, MD as Attending Physician (Gynecology)  ASSESSMENT & PLAN:  Malignant neoplasm of cervix [facility]) Unfortunately, despite dose adjustment, she is developing progressive, worsening pancytopenia I have to reschedule her chemotherapy to next week She will continue growth factor support with each cycle of treatment I plan to repeat PET CT scan before she sees her GYN oncologist in September for assessment of response to treatment  Pancytopenia, acquired [facility]) She has significant pancytopenia despite dose adjustment We will delay chemotherapy by 1 week She will continue growth factor support with each cycle She does not need blood or platelet transfusion I plan to reduce a dose of carboplatin a little bit  Other constipation She has chronic intermittent constipation likely exacerbated by side effects of treatment We discussed laxative therapy  Multiple lung nodules She is currently not symptomatic Recent imaging studies showed mild reduction in hypermetabolic activity with no new lesions I plan to repeat imaging study after 6 cycles of treatment   Orders Placed This Encounter  Procedures  . NM PET Image Restag (PS) Skull Base To Thigh    Standing Status:   Future    Standing Expiration Date:   05/11/2019    Order Specific Question:   If indicated for the ordered procedure, I authorize the administration of a radiopharmaceutical per Radiology protocol    Answer:   Yes    Order Specific Question:   Preferred imaging location?    Answer:   [facility]    Order Specific Question:   Radiology Contrast Protocol - do NOT remove file path    Answer:   [email or web address]    INTERVAL  HISTORY: Please see below for problem oriented charting. She returns for cycle 6 of chemotherapy She complained of fatigue with the last cycle of treatment 4 days ago, she developed mild rectal bleeding after bowel movement The bleeding stopped spontaneously.  She denies hematuria or nosebleeds She had history of constipation recently  Denies nausea Denies significant peripheral neuropathy No recent cough, chest pain or shortness of breath No recent infection, fever or chills  SUMMARY OF ONCOLOGIC HISTORY:   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[place])   07/04/2017 Initial Diagnosis    She presented to the GYN clinic with postmenopausal bleeding. Examination revealed abnormal cervix. Vaginal ultrasound showed a 10 cm uterus with an intramural fibroid, left ovary is slightly enlarged measuring 2.1 x 1.5 x 2.3 cm which is described as solid complex with a vascular flow. In addition the cervix is imaged showing a solid vascular mass measuring 2.7 x 2.1 x 2.2 cm. A sonohysterogram showed no abnormalities in the endometrium. Patient had a Pap smear showing high-grade squamous intraepithelial lesion with features suspicious for invasion as well as atypical glandular cells. 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      08/29/2017 PET scan    1. Cervical malignancy with maximum SUV 6.0, dominant cervical mass measuring 6.2 by 6.0 by 7.3 cm. 2. 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 3. There 12 scattered small pulmonary nodules measuring up to 5 mm in diameter. No hypermetabolic activity but these are below sensitive PET-CT size thresholds. These could be postinflammatory  or less likely neoplastic, surveillance is recommended. 4. Hypodense thyroid nodules are not hypermetabolic which strongly favors benign etiology.      09/10/2017 Procedure    Placement of a subcutaneous  port device.      09/12/2017 - 10/10/2017 Chemotherapy    The patient had weekly cisplatin for chemotherapy treatment.       09/12/2017 - 11/20/2017 Radiation Therapy    Radiation treatment dates:  External beam: 09/12/2017-10/29/2017, brachytherapy: 11/06/2017, 11/08/2017, 11/13/2017, 11/15/2017, 11/20/2017  Site/dose:   1. pelvis, 1.8 Gy in 25 fractions for a total dose of 45 Gy                      2. Sidewall Boost, 1.8 Gy in 5 fractions for a total dose of 9 Gy                      3. Nodal boost  2, 1.8 Gy in 2 fractions for a total dose of 3.6 Gy                      4. Cervix, 5.5 Gy (11/06/17), 6 Gy (11/08/17), 6 Gy (11/13/17), 5.5 Gy (11/15/17), 5.5 Gy (11/20/17)  Beams/energy:  1. 3D, 15X                              2. 3D, 15X                              3. 3D, 15X                              4. Brachytherapy via tandem-ring, [id number]      12/27/2017 PET scan    1. Interval mixed response to therapy. Interval decrease in hypermetabolism associated with the cervical mass. Left pelvic sidewall hypermetabolic lymph node seen previously has resolved. 2. Interval progression of bilateral pulmonary nodules with demonstrable FDG accumulation in these nodules on today's study. Imaging features are compatible with metastatic disease. There is a new 3 mm nodule in the left upper lobe. 3. Focus of FDG accumulation in the left adnexal region, likely related to the left ovary but tracks up along the left side of the uterus. This is indeterminate and may be related to the ovary although metastatic involvement is not excluded. Close attention on follow-up recommended. Pelvic MRI without and with contrast may possibly provide additional insight as clinically warranted.      01/24/2018 -  Chemotherapy    The patient had carboplatin and taxol for chemotherapy treatment.        03/21/2018 PET scan    1. Stable exam with no clear trend towards improving or progressing disease. The bilateral pulmonary  nodules measure similar in size today and show stable to minimal decrease in hypermetabolism since prior study. 2. Stable appearance of hypermetabolism in the left adnexal space, potentially related to the ovary. Continued attention on follow-up recommended. 3. No new or progressive hypermetabolic disease identified on today's study.       Metastasis to lung ([place])   01/15/2018 Initial Diagnosis    Metastasis to lung ([place])      01/21/2018 -  Chemotherapy    The patient had palonosetron (ALOXI) injection 0.25 mg, 0.25 mg, Intravenous,  Once, 5 of 6 cycles Administration:  0.25 mg (01/24/2018), 0.25 mg (02/14/2018), 0.25 mg (03/07/2018), 0.25 mg (03/28/2018), 0.25 mg (04/19/2018) CARBOplatin (PARAPLATIN) 420 mg in sodium chloride 0.9 % 250 mL chemo infusion, 420 mg (100 % of original dose 417 mg), Intravenous,  Once, 5 of 6 cycles Dose modification:   (original dose 417 mg, Cycle 1), 420 mg (original dose 417 mg, Cycle 2), 375.3 mg (90 % of original dose 417 mg, Cycle 6, Reason: Dose Not Tolerated) Administration: 420 mg (01/24/2018), 420 mg (02/14/2018), 420 mg (03/07/2018), 420 mg (03/28/2018), 420 mg (04/19/2018) PACLitaxel (TAXOL) 312 mg in sodium chloride 0.9 % 500 mL chemo infusion (> 80mg /m2), 175 mg/m2 = 312 mg, Intravenous,  Once, 5 of 6 cycles Dose modification: 140 mg/m2 (80 % of original dose 175 mg/m2, Cycle 4, Reason: Dose Not Tolerated) Administration: 312 mg (01/24/2018), 312 mg (02/14/2018), 312 mg (03/07/2018), 246 mg (03/28/2018), 246 mg (04/19/2018)  for chemotherapy treatment.        REVIEW OF SYSTEMS:   Constitutional: Denies fevers, chills or abnormal weight loss Eyes: Denies blurriness of vision Ears, nose, mouth, throat, and face: Denies mucositis or sore throat Respiratory: Denies cough, dyspnea or wheezes Cardiovascular: Denies palpitation, chest discomfort or lower extremity swelling Skin: Denies abnormal skin rashes Lymphatics: Denies new lymphadenopathy or easy  bruising Neurological:Denies numbness, tingling or new weaknesses Behavioral/Psych: Mood is stable, no new changes  All other systems were reviewed with the patient and are negative.  I have reviewed the past medical history, past surgical history, social history and family history with the patient and they are unchanged from previous note.  ALLERGIES:  is allergic to tegaderm ag mesh [silver].  MEDICATIONS:  Current Outpatient Medications  Medication Sig Dispense Refill  . dexamethasone (DECADRON) 4 MG tablet Take 5 tabs the night before and 5 tabs in the morning of chemotherapy, every 3 weeks, with food 10 tablet 1  . lidocaine-prilocaine (EMLA) cream Apply to affected area once (Patient not taking: Reported on 03/22/2018) 30 g 3  . ondansetron (ZOFRAN) 8 MG tablet Take 1 tablet (8 mg total) by mouth every 8 (eight) hours as needed for refractory nausea / vomiting. Start on day 3 after chemo. (Patient not taking: Reported on 03/22/2018) 30 tablet 1  . prochlorperazine (COMPAZINE) 10 MG tablet Take 1 tablet (10 mg total) by mouth every 6 (six) hours as needed (Nausea or vomiting). (Patient not taking: Reported on 03/22/2018) 30 tablet 1   No current facility-administered medications for this visit.     PHYSICAL EXAMINATION: ECOG PERFORMANCE STATUS: 1 - Symptomatic but completely ambulatory  Vitals:   05/09/18 0837  BP: 129/74  Pulse: (!) 123  Temp: 98.4 F (36.9 C)  SpO2: 100%   Filed Weights   05/09/18 0837  Weight: 137 lb 3.2 oz (62.2 kg)    GENERAL:alert, no distress and comfortable SKIN: skin color, texture, turgor are normal, no rashes or significant lesions EYES: normal, Conjunctiva are pink and non-injected, sclera clear OROPHARYNX:no exudate, no erythema and lips, buccal mucosa, and tongue normal  NECK: supple, thyroid normal size, non-tender, without nodularity LYMPH:  no palpable lymphadenopathy in the cervical, axillary or inguinal LUNGS: clear to auscultation and  percussion with normal breathing effort HEART: regular rate & rhythm and no murmurs and no lower extremity edema ABDOMEN:abdomen soft, non-tender and normal bowel sounds Musculoskeletal:no cyanosis of digits and no clubbing  NEURO: alert & oriented x 3 with fluent speech, no focal motor/sensory deficits  LABORATORY DATA:  I have reviewed the data as listed  Component Value Date/Time   NA 139 05/09/2018 0814   NA 141 10/15/2017 0900   K 3.9 05/09/2018 0814   K 4.2 10/15/2017 0900   CL 102 05/09/2018 0814   CO2 26 05/09/2018 0814   CO2 28 10/15/2017 0900   GLUCOSE 171 (H) 05/09/2018 0814   GLUCOSE 105 10/15/2017 0900   BUN 17 05/09/2018 0814   BUN 10.8 10/15/2017 0900   CREATININE 0.93 05/09/2018 0814   CREATININE 0.93 03/28/2018 0856   CREATININE 1.1 10/15/2017 0900   CALCIUM 10.0 05/09/2018 0814   CALCIUM 9.3 10/15/2017 0900   PROT 7.4 05/09/2018 0814   PROT 7.1 10/15/2017 0900   ALBUMIN 3.9 05/09/2018 0814   ALBUMIN 3.7 10/15/2017 0900   AST 16 05/09/2018 0814   AST 19 03/28/2018 0856   AST 17 10/15/2017 0900   ALT 10 05/09/2018 0814   ALT 13 03/28/2018 0856   ALT 19 10/15/2017 0900   ALKPHOS 92 05/09/2018 0814   ALKPHOS 95 10/15/2017 0900   BILITOT 0.4 05/09/2018 0814   BILITOT 0.3 03/28/2018 0856   BILITOT 0.29 10/15/2017 0900   GFRNONAA >60 05/09/2018 0814   GFRNONAA >60 03/28/2018 0856   GFRAA >60 05/09/2018 0814   GFRAA >60 03/28/2018 0856    No results found for: SPEP, UPEP  Lab Results  Component Value Date   WBC 2.9 (L) 05/09/2018   NEUTROABS 2.8 05/09/2018   HGB 8.5 (L) 05/09/2018   HCT 25.1 (L) 05/09/2018   MCV 96.3 05/09/2018   PLT 56 (L) 05/09/2018      Chemistry      Component Value Date/Time   NA 139 05/09/2018 0814   NA 141 10/15/2017 0900   K 3.9 05/09/2018 0814   K 4.2 10/15/2017 0900   CL 102 05/09/2018 0814   CO2 26 05/09/2018 0814   CO2 28 10/15/2017 0900   BUN 17 05/09/2018 0814   BUN 10.8 10/15/2017 0900   CREATININE 0.93  05/09/2018 0814   CREATININE 0.93 03/28/2018 0856   CREATININE 1.1 10/15/2017 0900      Component Value Date/Time   CALCIUM 10.0 05/09/2018 0814   CALCIUM 9.3 10/15/2017 0900   ALKPHOS 92 05/09/2018 0814   ALKPHOS 95 10/15/2017 0900   AST 16 05/09/2018 0814   AST 19 03/28/2018 0856   AST 17 10/15/2017 0900   ALT 10 05/09/2018 0814   ALT 13 03/28/2018 0856   ALT 19 10/15/2017 0900   BILITOT 0.4 05/09/2018 0814   BILITOT 0.3 03/28/2018 0856   BILITOT 0.29 10/15/2017 0900       All questions were answered. The patient knows to call the clinic with any problems, questions or concerns. No barriers to learning was detected.  I spent 25 minutes counseling the patient face to face. The total time spent in the appointment was 40 minutes and more than 50% was on counseling and review of test results  Heath Lark, MD 05/10/2018 8:06 AM

## 2018-05-10 NOTE — Assessment & Plan Note (Signed)
She has chronic intermittent constipation likely exacerbated by side effects of treatment We discussed laxative therapy

## 2018-05-11 ENCOUNTER — Inpatient Hospital Stay: Payer: Medicare HMO

## 2018-05-16 ENCOUNTER — Inpatient Hospital Stay: Payer: Medicare HMO

## 2018-05-16 ENCOUNTER — Telehealth: Payer: Self-pay | Admitting: Hematology and Oncology

## 2018-05-16 ENCOUNTER — Encounter: Payer: Self-pay | Admitting: Hematology and Oncology

## 2018-05-16 ENCOUNTER — Inpatient Hospital Stay: Payer: Medicare HMO | Attending: Hematology and Oncology

## 2018-05-16 ENCOUNTER — Other Ambulatory Visit: Payer: Self-pay | Admitting: Hematology and Oncology

## 2018-05-16 VITALS — BP 133/84 | HR 121 | Temp 97.7°F

## 2018-05-16 DIAGNOSIS — K5900 Constipation, unspecified: Secondary | ICD-10-CM | POA: Insufficient documentation

## 2018-05-16 DIAGNOSIS — D61818 Other pancytopenia: Secondary | ICD-10-CM | POA: Insufficient documentation

## 2018-05-16 DIAGNOSIS — Z5111 Encounter for antineoplastic chemotherapy: Secondary | ICD-10-CM | POA: Insufficient documentation

## 2018-05-16 DIAGNOSIS — C541 Malignant neoplasm of endometrium: Secondary | ICD-10-CM | POA: Insufficient documentation

## 2018-05-16 DIAGNOSIS — Z79899 Other long term (current) drug therapy: Secondary | ICD-10-CM | POA: Diagnosis not present

## 2018-05-16 DIAGNOSIS — R918 Other nonspecific abnormal finding of lung field: Secondary | ICD-10-CM

## 2018-05-16 DIAGNOSIS — Z923 Personal history of irradiation: Secondary | ICD-10-CM | POA: Diagnosis not present

## 2018-05-16 DIAGNOSIS — C539 Malignant neoplasm of cervix uteri, unspecified: Secondary | ICD-10-CM

## 2018-05-16 DIAGNOSIS — Z7689 Persons encountering health services in other specified circumstances: Secondary | ICD-10-CM | POA: Diagnosis not present

## 2018-05-16 DIAGNOSIS — C7801 Secondary malignant neoplasm of right lung: Secondary | ICD-10-CM | POA: Diagnosis not present

## 2018-05-16 DIAGNOSIS — C7802 Secondary malignant neoplasm of left lung: Secondary | ICD-10-CM | POA: Diagnosis not present

## 2018-05-16 LAB — CBC WITH DIFFERENTIAL/PLATELET
BASOS ABS: 0 10*3/uL (ref 0.0–0.1)
BASOS PCT: 0 %
EOS ABS: 0 10*3/uL (ref 0.0–0.5)
Eosinophils Relative: 0 %
HCT: 25.1 % — ABNORMAL LOW (ref 34.8–46.6)
HEMOGLOBIN: 8.5 g/dL — AB (ref 11.6–15.9)
Lymphocytes Relative: 6 %
Lymphs Abs: 0.1 10*3/uL — ABNORMAL LOW (ref 0.9–3.3)
MCH: 32.9 pg (ref 25.1–34.0)
MCHC: 33.9 g/dL (ref 31.5–36.0)
MCV: 97 fL (ref 79.5–101.0)
Monocytes Absolute: 0 10*3/uL — ABNORMAL LOW (ref 0.1–0.9)
Monocytes Relative: 0 %
NEUTROS PCT: 94 %
Neutro Abs: 2.2 10*3/uL (ref 1.5–6.5)
Platelets: 69 10*3/uL — ABNORMAL LOW (ref 145–400)
RBC: 2.58 MIL/uL — AB (ref 3.70–5.45)
RDW: 18.1 % — ABNORMAL HIGH (ref 11.2–14.5)
WBC: 2.4 10*3/uL — ABNORMAL LOW (ref 3.9–10.3)

## 2018-05-16 LAB — COMPREHENSIVE METABOLIC PANEL
ALBUMIN: 3.9 g/dL (ref 3.5–5.0)
ALK PHOS: 80 U/L (ref 38–126)
ALT: 13 U/L (ref 0–44)
ANION GAP: 11 (ref 5–15)
AST: 14 U/L — AB (ref 15–41)
BUN: 16 mg/dL (ref 8–23)
CALCIUM: 9.7 mg/dL (ref 8.9–10.3)
CO2: 26 mmol/L (ref 22–32)
Chloride: 103 mmol/L (ref 98–111)
Creatinine, Ser: 0.93 mg/dL (ref 0.44–1.00)
GFR calc Af Amer: >60 mL/min (ref >60)
GFR calc non Af Amer: >60 mL/min (ref >60)
GLUCOSE: 188 mg/dL — AB (ref 70–99)
Potassium: 4 mmol/L (ref 3.5–5.1)
SODIUM: 140 mmol/L (ref 135–145)
Total Bilirubin: 0.3 mg/dL (ref 0.3–1.2)
Total Protein: 7.4 g/dL (ref 6.5–8.1)

## 2018-05-16 MED ORDER — DEXAMETHASONE 4 MG PO TABS: ORAL_TABLET | ORAL | 1 refills | Status: AC

## 2018-05-16 MED ORDER — HEPARIN SOD (PORK) LOCK FLUSH 100 UNIT/ML IV SOLN
500.0000 [IU] | Freq: Once | INTRAVENOUS | Status: AC
  Administered 2018-05-16: 500 [IU]
  Filled 2018-05-16: qty 5

## 2018-05-16 MED ORDER — SODIUM CHLORIDE 0.9% FLUSH
10.0000 mL | Freq: Once | INTRAVENOUS | Status: AC
  Administered 2018-05-16: 10 mL
  Filled 2018-05-16: qty 10

## 2018-05-16 NOTE — Telephone Encounter (Signed)
Gave calendar per 8/1 sch msg

## 2018-05-16 NOTE — Telephone Encounter (Signed)
Appointment scheduled and I spoke with patient per 8/1 sch msg

## 2018-05-16 NOTE — Progress Notes (Signed)
No tx today per Darliss Ridgel, per MD Alvy Bimler. Pt sent to scheduling per MD Alvy Bimler. All questions answered, pt will call [place] with any concerns.

## 2018-05-17 ENCOUNTER — Ambulatory Visit: Payer: Medicare HMO

## 2018-05-18 ENCOUNTER — Inpatient Hospital Stay: Payer: Medicare HMO

## 2018-05-22 ENCOUNTER — Inpatient Hospital Stay: Payer: Medicare HMO

## 2018-05-22 ENCOUNTER — Telehealth: Payer: Self-pay | Admitting: *Deleted

## 2018-05-22 ENCOUNTER — Other Ambulatory Visit: Payer: Self-pay | Admitting: Hematology and Oncology

## 2018-05-22 DIAGNOSIS — Z5111 Encounter for antineoplastic chemotherapy: Secondary | ICD-10-CM | POA: Diagnosis not present

## 2018-05-22 DIAGNOSIS — C539 Malignant neoplasm of cervix uteri, unspecified: Secondary | ICD-10-CM

## 2018-05-22 DIAGNOSIS — C7800 Secondary malignant neoplasm of unspecified lung: Secondary | ICD-10-CM

## 2018-05-22 LAB — CBC WITH DIFFERENTIAL (CANCER CENTER ONLY)
BASOS PCT: 0 %
Basophils Absolute: 0 10*3/uL (ref 0.0–0.1)
EOS PCT: 0 %
Eosinophils Absolute: 0 10*3/uL (ref 0.0–0.5)
HCT: 26.9 % — ABNORMAL LOW (ref 34.8–46.6)
Hemoglobin: 9 g/dL — ABNORMAL LOW (ref 11.6–15.9)
Lymphocytes Relative: 13 %
Lymphs Abs: 0.4 10*3/uL — ABNORMAL LOW (ref 0.9–3.3)
MCH: 32.7 pg (ref 25.1–34.0)
MCHC: 33.3 g/dL (ref 31.5–36.0)
MCV: 98.2 fL (ref 79.5–101.0)
MONOS PCT: 9 %
Monocytes Absolute: 0.3 10*3/uL (ref 0.1–0.9)
Neutro Abs: 2.4 10*3/uL (ref 1.5–6.5)
Neutrophils Relative %: 78 %
PLATELETS: 115 10*3/uL — AB (ref 145–400)
RBC: 2.74 MIL/uL — ABNORMAL LOW (ref 3.70–5.45)
RDW: 17.5 % — AB (ref 11.2–14.5)
WBC Count: 3.1 10*3/uL — ABNORMAL LOW (ref 3.9–10.3)

## 2018-05-22 LAB — CMP (CANCER CENTER ONLY)
ALT: 10 U/L (ref 0–44)
ANION GAP: 9 (ref 5–15)
AST: 12 U/L — AB (ref 15–41)
Albumin: 3.6 g/dL (ref 3.5–5.0)
Alkaline Phosphatase: 82 U/L (ref 38–126)
BUN: 11 mg/dL (ref 8–23)
CHLORIDE: 103 mmol/L (ref 98–111)
CO2: 28 mmol/L (ref 22–32)
Calcium: 9.7 mg/dL (ref 8.9–10.3)
Creatinine: 0.86 mg/dL (ref 0.44–1.00)
GFR, Est AFR Am: >60 mL/min (ref >60)
Glucose, Bld: 110 mg/dL — ABNORMAL HIGH (ref 70–99)
POTASSIUM: 4 mmol/L (ref 3.5–5.1)
Sodium: 140 mmol/L (ref 135–145)
TOTAL PROTEIN: 7.1 g/dL (ref 6.5–8.1)
Total Bilirubin: 0.3 mg/dL (ref 0.3–1.2)

## 2018-05-22 NOTE — Telephone Encounter (Signed)
Notified of message below

## 2018-05-22 NOTE — Telephone Encounter (Signed)
-----   Message from Heath Lark, MD sent at 05/22/2018 12:07 PM EDT ----- Regarding: labs ok pls let her know labs are back all ok to proceed tomorrow ----- Message ----- From: Buel Ream, Lab In [place] Sent: 05/22/2018  11:30 AM To: Heath Lark, MD

## 2018-05-23 ENCOUNTER — Inpatient Hospital Stay: Payer: Medicare HMO

## 2018-05-23 VITALS — BP 138/80 | HR 115 | Temp 98.7°F | Resp 18

## 2018-05-23 DIAGNOSIS — C7800 Secondary malignant neoplasm of unspecified lung: Secondary | ICD-10-CM

## 2018-05-23 DIAGNOSIS — Z5111 Encounter for antineoplastic chemotherapy: Secondary | ICD-10-CM | POA: Diagnosis not present

## 2018-05-23 DIAGNOSIS — C539 Malignant neoplasm of cervix uteri, unspecified: Secondary | ICD-10-CM

## 2018-05-23 MED ORDER — PALONOSETRON HCL INJECTION 0.25 MG/5ML
INTRAVENOUS | Status: AC
Start: 2018-05-23 — End: ?
  Filled 2018-05-23: qty 5

## 2018-05-23 MED ORDER — HEPARIN SOD (PORK) LOCK FLUSH 100 UNIT/ML IV SOLN
500.0000 [IU] | Freq: Once | INTRAVENOUS | Status: DC | PRN
  Filled 2018-05-23: qty 5

## 2018-05-23 MED ORDER — DIPHENHYDRAMINE HCL 50 MG/ML IJ SOLN
INTRAMUSCULAR | Status: AC
  Filled 2018-05-23: qty 1

## 2018-05-23 MED ORDER — SODIUM CHLORIDE 0.9 % IV SOLN
140.0000 mg/m2 | Freq: Once | INTRAVENOUS | Status: AC
  Administered 2018-05-23: 246 mg via INTRAVENOUS
  Filled 2018-05-23: qty 41

## 2018-05-23 MED ORDER — FAMOTIDINE IN NACL 20-0.9 MG/50ML-% IV SOLN
INTRAVENOUS | Status: AC
Start: 2018-05-23 — End: ?
  Filled 2018-05-23: qty 50

## 2018-05-23 MED ORDER — DIPHENHYDRAMINE HCL 50 MG/ML IJ SOLN
50.0000 mg | Freq: Once | INTRAMUSCULAR | Status: AC
  Administered 2018-05-23: 50 mg via INTRAVENOUS

## 2018-05-23 MED ORDER — SODIUM CHLORIDE 0.9% FLUSH
10.0000 mL | INTRAVENOUS | Status: DC | PRN
  Filled 2018-05-23: qty 10

## 2018-05-23 MED ORDER — SODIUM CHLORIDE 0.9 % IV SOLN
20.0000 mg | Freq: Once | INTRAVENOUS | Status: AC
  Administered 2018-05-23: 20 mg via INTRAVENOUS
  Filled 2018-05-23: qty 2

## 2018-05-23 MED ORDER — PALONOSETRON HCL INJECTION 0.25 MG/5ML
0.2500 mg | Freq: Once | INTRAVENOUS | Status: AC
  Administered 2018-05-23: 0.25 mg via INTRAVENOUS

## 2018-05-23 MED ORDER — FAMOTIDINE IN NACL 20-0.9 MG/50ML-% IV SOLN
20.0000 mg | Freq: Once | INTRAVENOUS | Status: AC
  Administered 2018-05-23: 20 mg via INTRAVENOUS

## 2018-05-23 MED ORDER — SODIUM CHLORIDE 0.9 % IV SOLN
375.3000 mg | Freq: Once | INTRAVENOUS | Status: AC
  Administered 2018-05-23: 380 mg via INTRAVENOUS
  Filled 2018-05-23: qty 38

## 2018-05-23 MED ORDER — SODIUM CHLORIDE 0.9 % IV SOLN
Freq: Once | INTRAVENOUS | Status: AC
  Administered 2018-05-23: 09:00:00 via INTRAVENOUS
  Filled 2018-05-23: qty 250

## 2018-05-23 NOTE — Patient Instructions (Signed)
[place] Discharge Instructions for Patients Receiving Chemotherapy  Today you received the following chemotherapy agents: Carboplatin and Taxol.  To help prevent nausea and vomiting after your treatment, we encourage you to take your nausea medication as directed  If you develop nausea and vomiting that is not controlled by your nausea medication, call the clinic.   BELOW ARE SYMPTOMS THAT SHOULD BE REPORTED IMMEDIATELY:  *FEVER GREATER THAN 100.5 F  *CHILLS WITH OR WITHOUT FEVER  NAUSEA AND VOMITING THAT IS NOT CONTROLLED WITH YOUR NAUSEA MEDICATION  *UNUSUAL SHORTNESS OF BREATH  *UNUSUAL BRUISING OR BLEEDING  TENDERNESS IN MOUTH AND THROAT WITH OR WITHOUT PRESENCE OF ULCERS  *URINARY PROBLEMS  *BOWEL PROBLEMS  UNUSUAL RASH Items with * indicate a potential emergency and should be followed up as soon as possible.  Feel free to call the clinic you have any questions or concerns. The clinic phone number is [phone].

## 2018-05-25 ENCOUNTER — Inpatient Hospital Stay: Payer: Medicare HMO

## 2018-05-25 VITALS — BP 119/81 | HR 112 | Temp 98.8°F | Resp 16

## 2018-05-25 DIAGNOSIS — Z5111 Encounter for antineoplastic chemotherapy: Secondary | ICD-10-CM | POA: Diagnosis not present

## 2018-05-25 DIAGNOSIS — C530 Malignant neoplasm of endocervix: Secondary | ICD-10-CM

## 2018-05-25 MED ORDER — PEGFILGRASTIM-CBQV 6 MG/0.6ML ~~LOC~~ SOSY
PREFILLED_SYRINGE | SUBCUTANEOUS | Status: AC
Start: 2018-05-25 — End: ?
  Filled 2018-05-25: qty 0.6

## 2018-05-25 MED ORDER — PEGFILGRASTIM-CBQV 6 MG/0.6ML ~~LOC~~ SOSY
6.0000 mg | PREFILLED_SYRINGE | Freq: Once | SUBCUTANEOUS | Status: AC
  Administered 2018-05-25: 6 mg via SUBCUTANEOUS

## 2018-05-25 NOTE — Patient Instructions (Signed)
Pegfilgrastim injection What is this medicine? PEGFILGRASTIM (PEG fil gra stim) is a long-acting granulocyte colony-stimulating factor that stimulates the growth of neutrophils, a type of white blood cell important in the body's fight against infection. It is used to reduce the incidence of fever and infection in patients with certain types of cancer who are receiving chemotherapy that affects the bone marrow, and to increase survival after being exposed to high doses of radiation. This medicine may be used for other purposes; ask your health care provider or pharmacist if you have questions. COMMON BRAND NAME(S): Neulasta What should I tell my health care provider before I take this medicine? They need to know if you have any of these conditions: -kidney disease -latex allergy -ongoing radiation therapy -sickle cell disease -skin reactions to acrylic adhesives (On-Body Injector only) -an unusual or allergic reaction to pegfilgrastim, filgrastim, other medicines, foods, dyes, or preservatives -pregnant or trying to get pregnant -breast-feeding How should I use this medicine? This medicine is for injection under the skin. If you get this medicine at home, you will be taught how to prepare and give the pre-filled syringe or how to use the On-body Injector. Refer to the patient Instructions for Use for detailed instructions. Use exactly as directed. Tell your healthcare provider immediately if you suspect that the On-body Injector may not have performed as intended or if you suspect the use of the On-body Injector resulted in a missed or partial dose. It is important that you put your used needles and syringes in a special sharps container. Do not put them in a trash can. If you do not have a sharps container, call your pharmacist or healthcare provider to get one. Talk to your pediatrician regarding the use of this medicine in children. While this drug may be prescribed for selected conditions,  precautions do apply. Overdosage: If you think you have taken too much of this medicine contact a poison control center or emergency room at once. NOTE: This medicine is only for you. Do not share this medicine with others. What if I miss a dose? It is important not to miss your dose. Call your doctor or health care professional if you miss your dose. If you miss a dose due to an On-body Injector failure or leakage, a new dose should be administered as soon as possible using a single prefilled syringe for manual use. What may interact with this medicine? Interactions have not been studied. Give your health care provider a list of all the medicines, herbs, non-prescription drugs, or dietary supplements you use. Also tell them if you smoke, drink alcohol, or use illegal drugs. Some items may interact with your medicine. This list may not describe all possible interactions. Give your health care provider a list of all the medicines, herbs, non-prescription drugs, or dietary supplements you use. Also tell them if you smoke, drink alcohol, or use illegal drugs. Some items may interact with your medicine. What should I watch for while using this medicine? You may need blood work done while you are taking this medicine. If you are going to need a MRI, CT scan, or other procedure, tell your doctor that you are using this medicine (On-Body Injector only). What side effects may I notice from receiving this medicine? Side effects that you should report to your doctor or health care professional as soon as possible: -allergic reactions like skin rash, itching or hives, swelling of the face, lips, or tongue -dizziness -fever -pain, redness, or irritation at site   where injected -pinpoint red spots on the skin -red or dark-brown urine -shortness of breath or breathing problems -stomach or side pain, or pain at the shoulder -swelling -tiredness -trouble passing urine or change in the amount of urine Side  effects that usually do not require medical attention (report to your doctor or health care professional if they continue or are bothersome): -bone pain -muscle pain This list may not describe all possible side effects. Call your doctor for medical advice about side effects. You may report side effects to FDA at 1-800-FDA-1088. Where should I keep my medicine? Keep out of the reach of children. Store pre-filled syringes in a refrigerator between 2 and 8 degrees C (36 and 46 degrees F). Do not freeze. Keep in carton to protect from light. Throw away this medicine if it is left out of the refrigerator for more than 48 hours. Throw away any unused medicine after the expiration date. NOTE: This sheet is a summary. It may not cover all possible information. If you have questions about this medicine, talk to your doctor, pharmacist, or health care provider.  2018 Elsevier/Gold Standard (2016-09-28 12:58:03)  

## 2018-05-27 ENCOUNTER — Ambulatory Visit: Payer: Self-pay | Admitting: Radiation Oncology

## 2018-06-03 ENCOUNTER — Encounter: Payer: Self-pay | Admitting: Radiation Oncology

## 2018-06-03 ENCOUNTER — Other Ambulatory Visit: Payer: Self-pay

## 2018-06-03 ENCOUNTER — Ambulatory Visit
Admission: RE | Admit: 2018-06-03 | Discharge: 2018-06-03 | Disposition: A | Discharge status: Home / Self Care | Payer: Medicare HMO | Source: Ambulatory Visit | Attending: Radiation Oncology | Admitting: Radiation Oncology

## 2018-06-03 VITALS — BP 126/78 | HR 113 | Temp 98.7°F | Resp 20 | Ht 65.0 in | Wt 135.4 lb

## 2018-06-03 DIAGNOSIS — C7800 Secondary malignant neoplasm of unspecified lung: Secondary | ICD-10-CM | POA: Insufficient documentation

## 2018-06-03 DIAGNOSIS — Z79899 Other long term (current) drug therapy: Secondary | ICD-10-CM | POA: Insufficient documentation

## 2018-06-03 DIAGNOSIS — Z923 Personal history of irradiation: Secondary | ICD-10-CM | POA: Diagnosis not present

## 2018-06-03 DIAGNOSIS — C530 Malignant neoplasm of endocervix: Secondary | ICD-10-CM | POA: Insufficient documentation

## 2018-06-03 NOTE — Progress Notes (Signed)
Radiation Oncology         [phone] ________________________________  Name: Maureen Vaughan MRN: [id number]  Date: 06/03/2018  DOB: 10-12-1951  Follow-Up Visit Note  CC: Cloward, [name], MD  Marti Sleigh    ICD-10-CM   1. Malignant neoplasm of endocervix ([place]) C53.0   2. Malignant neoplasm metastatic to lung, unspecified laterality ([facility]) C78.00     Diagnosis: Stage II-B adenocarcinoma of the cervixwith radiographically PET+ left external iliac adenopathy, now with metastasis to lung  Interval Since Last Radiation: 6 months and 2 weeks  09/12/17-10/29/17: 45 Gy to the pelvis with 9 Gy sidewall boost and 3.6 Gy nodal boost 11/06/17-11/20/17: 28.5 Gy to the cervix  Narrative:  The patient returns today for routine follow-up. She has just completed second second of chemotherapy for treatment of her lung metastasis. She notes having felt more fatigue and experienced hair loss during this round. She denies pelvic pain, vaginal bleeding, bloating, diarrhea, nausea, and any breathing problems.   Since her last visit, the patient underwent a PET scan on 03/21/18, which revealed: 1. Stable exam with no clear trend towards improving or progressing disease. The bilateral pulmonary nodules measure similar in size today and show stable to minimal decrease in hypermetabolism since prior study. 2. Stable appearance of hypermetabolism in the left adnexal space, potentially related to the ovary. Continued attention on follow-up recommended. 3. No new or progressive hypermetabolic disease identified on today's study.          ALLERGIES:  is allergic to tegaderm ag mesh [silver].  Meds: Current Outpatient Medications  Medication Sig Dispense Refill  . dexamethasone (DECADRON) 4 MG tablet Take 5 tabs the night before and 5 tabs in the morning of chemotherapy, every 3 weeks, with food 10 tablet 1  . ondansetron (ZOFRAN) 8 MG tablet Take 1 tablet (8 mg total) by mouth every 8 (eight) hours  as needed for refractory nausea / vomiting. Start on day 3 after chemo. 30 tablet 1  . prochlorperazine (COMPAZINE) 10 MG tablet Take 1 tablet (10 mg total) by mouth every 6 (six) hours as needed (Nausea or vomiting). 30 tablet 1  . lidocaine-prilocaine (EMLA) cream Apply to affected area once (Patient not taking: Reported on 03/22/2018) 30 g 3   No current facility-administered medications for this encounter.     Physical Findings: The patient is in no acute distress. Patient is alert and oriented.  height is 5\' 5"  (1.651 m) and weight is 135 lb 6.4 oz (61.4 kg). Her oral temperature is 98.7 F (37.1 C). Her blood pressure is 126/78 and her pulse is 113 (abnormal). Her respiration is 20 and oxygen saturation is 100%. .  No significant changes. Lungs are clear to auscultation bilaterally. Heart has regular rate and rhythm. No palpable cervical, supraclavicular, or axillary adenopathy. Abdomen soft, non-tender, normal bowel sounds. Pelvic exam deferred in light of her pulmonary metastasis. She saw Dr. Fermin Schwab on 03/22/18, and he performed a pelvic exam. She will follow up with him next month.  Lab Findings: Lab Results  Component Value Date   WBC 3.1 (L) 05/22/2018   HGB 9.0 (L) 05/22/2018   HCT 26.9 (L) 05/22/2018   MCV 98.2 05/22/2018   PLT 115 (L) 05/22/2018    Radiographic Findings: No results found.  Impression: Stage II-B adenocarcinoma of the cervix, now with lung metastasis  Patient is receiving chemotherapy for her pulmonary metastasis. She seems to be tolerating chemotherapy fairly well. She had a modest response to her chemotherapy, but  no new problems. Tumor has responded well in the pelvis with her previous radiation treatment.    Plan: PRN follow up with radiation oncology. She has an appointment with Dr.Clarke-Pearson on 07/09/18, following an ordered PET scan to determine if she will require additional  chemotherapy.  __________________________________ -----------------------------------  Blair Promise, PhD, MD  This document serves as a record of services personally performed by Gery Pray, MD. It was created on his behalf by Wilburn Mylar, a trained medical scribe. The creation of this record is based on the scribe's personal observations and the provider's statements to them. This document has been checked and approved by the attending provider.

## 2018-06-03 NOTE — Progress Notes (Signed)
Pt presents today for f/u with Dr. Sondra Come. Pt denies c/o pain except rare "shooting" pains on left pelvic area. Pt reports last chemotherapy infusion was approximately 2 weeks ago and is now finished with chemotherapy. Pt reports fatigue is much improved. Pt denies c/o dysuria/hematuria. Pt denies vaginal discharge/bleeding. Pt denies rectal bleeding and diarrhea. Pt c/o intermittent bouts of constipation. Pt denies N/V or abdominal bloating. Pt states she stopped using her vaginal dilator "a couple months ago". Pt is not having sexual intercourse.   BP 126/78 (BP Location: Right Arm, Patient Position: Sitting)   Pulse (!) 113   Temp 98.7 F (37.1 C) (Oral)   Resp 20   Ht 5\' 5"  (1.651 m)   Wt 135 lb 6.4 oz (61.4 kg)   SpO2 100%   BMI 22.53 kg/m   Wt Readings from Last 3 Encounters:  06/03/18 135 lb 6.4 oz (61.4 kg)  05/09/18 137 lb 3.2 oz (62.2 kg)  04/19/18 140 lb (63.5 kg)   Loma Sousa, RN BSN

## 2018-06-27 ENCOUNTER — Other Ambulatory Visit: Payer: Self-pay | Admitting: *Deleted

## 2018-06-27 ENCOUNTER — Telehealth: Payer: Self-pay | Admitting: Hematology and Oncology

## 2018-06-27 DIAGNOSIS — C7800 Secondary malignant neoplasm of unspecified lung: Secondary | ICD-10-CM

## 2018-06-27 DIAGNOSIS — C530 Malignant neoplasm of endocervix: Secondary | ICD-10-CM

## 2018-06-27 NOTE — Telephone Encounter (Signed)
Faxed medical records to [facility], Release ID: [id number]

## 2018-06-28 ENCOUNTER — Telehealth: Payer: Self-pay | Admitting: Hematology and Oncology

## 2018-06-28 NOTE — Telephone Encounter (Signed)
LVm for pt regarding upcoming appts per 9/12 sch message.

## 2018-07-01 NOTE — Progress Notes (Signed)
PA for ondansetron 4 mg has been submitted.

## 2018-07-08 ENCOUNTER — Inpatient Hospital Stay: Payer: Medicare HMO | Attending: Hematology and Oncology

## 2018-07-08 ENCOUNTER — Inpatient Hospital Stay: Payer: Medicare HMO

## 2018-07-08 ENCOUNTER — Ambulatory Visit (HOSPITAL_COMMUNITY)
Admission: RE | Admit: 2018-07-08 | Discharge: 2018-07-08 | Disposition: A | Discharge status: Home / Self Care | Payer: Medicare HMO | Source: Ambulatory Visit | Attending: Hematology and Oncology | Admitting: Hematology and Oncology

## 2018-07-08 DIAGNOSIS — R590 Localized enlarged lymph nodes: Secondary | ICD-10-CM | POA: Insufficient documentation

## 2018-07-08 DIAGNOSIS — R188 Other ascites: Secondary | ICD-10-CM | POA: Insufficient documentation

## 2018-07-08 DIAGNOSIS — C530 Malignant neoplasm of endocervix: Secondary | ICD-10-CM | POA: Insufficient documentation

## 2018-07-08 DIAGNOSIS — C539 Malignant neoplasm of cervix uteri, unspecified: Secondary | ICD-10-CM

## 2018-07-08 DIAGNOSIS — Z9221 Personal history of antineoplastic chemotherapy: Secondary | ICD-10-CM | POA: Insufficient documentation

## 2018-07-08 DIAGNOSIS — Z923 Personal history of irradiation: Secondary | ICD-10-CM | POA: Insufficient documentation

## 2018-07-08 DIAGNOSIS — C7800 Secondary malignant neoplasm of unspecified lung: Secondary | ICD-10-CM | POA: Insufficient documentation

## 2018-07-08 LAB — CMP (CANCER CENTER ONLY)
ALBUMIN: 3.4 g/dL — AB (ref 3.5–5.0)
ALK PHOS: 64 U/L (ref 38–126)
ALT: <6 U/L (ref 0–44)
AST: 13 U/L — ABNORMAL LOW (ref 15–41)
Anion gap: 12 (ref 5–15)
BUN: 16 mg/dL (ref 8–23)
CALCIUM: 9.7 mg/dL (ref 8.9–10.3)
CO2: 27 mmol/L (ref 22–32)
Chloride: 104 mmol/L (ref 98–111)
Creatinine: 0.85 mg/dL (ref 0.44–1.00)
GFR, Est AFR Am: >60 mL/min (ref >60)
GFR, Estimated: >60 mL/min (ref >60)
GLUCOSE: 103 mg/dL — AB (ref 70–99)
Potassium: 4 mmol/L (ref 3.5–5.1)
SODIUM: 143 mmol/L (ref 135–145)
Total Bilirubin: 0.3 mg/dL (ref 0.3–1.2)
Total Protein: 7.1 g/dL (ref 6.5–8.1)

## 2018-07-08 LAB — CBC WITH DIFFERENTIAL (CANCER CENTER ONLY)
BASOS ABS: 0 10*3/uL (ref 0.0–0.1)
BASOS PCT: 0 %
EOS ABS: 0 10*3/uL (ref 0.0–0.5)
Eosinophils Relative: 1 %
HEMATOCRIT: 28.9 % — AB (ref 34.8–46.6)
HEMOGLOBIN: 9.2 g/dL — AB (ref 11.6–15.9)
Lymphocytes Relative: 10 %
Lymphs Abs: 0.4 10*3/uL — ABNORMAL LOW (ref 0.9–3.3)
MCH: 30.2 pg (ref 25.1–34.0)
MCHC: 31.8 g/dL (ref 31.5–36.0)
MCV: 94.8 fL (ref 79.5–101.0)
Monocytes Absolute: 0.3 10*3/uL (ref 0.1–0.9)
Monocytes Relative: 7 %
NEUTROS ABS: 3.3 10*3/uL (ref 1.5–6.5)
NEUTROS PCT: 82 %
Platelet Count: 197 10*3/uL (ref 145–400)
RBC: 3.05 MIL/uL — ABNORMAL LOW (ref 3.70–5.45)
RDW: 15.3 % — ABNORMAL HIGH (ref 11.2–14.5)
WBC: 4 10*3/uL (ref 3.9–10.3)

## 2018-07-08 LAB — GLUCOSE, CAPILLARY: GLUCOSE-CAPILLARY: 99 mg/dL (ref 70–99)

## 2018-07-08 MED ORDER — HEPARIN SOD (PORK) LOCK FLUSH 100 UNIT/ML IV SOLN
500.0000 [IU] | Freq: Once | INTRAVENOUS | Status: DC
  Filled 2018-07-08: qty 5

## 2018-07-08 MED ORDER — FLUDEOXYGLUCOSE F - 18 (FDG) INJECTION
6.0000 | Freq: Once | INTRAVENOUS | Status: AC | PRN
  Administered 2018-07-08: 6 via INTRAVENOUS

## 2018-07-08 MED ORDER — SODIUM CHLORIDE 0.9% FLUSH
10.0000 mL | Freq: Once | INTRAVENOUS | Status: AC
  Administered 2018-07-08: 10 mL
  Filled 2018-07-08: qty 10

## 2018-07-09 ENCOUNTER — Inpatient Hospital Stay (HOSPITAL_BASED_OUTPATIENT_CLINIC_OR_DEPARTMENT_OTHER): Payer: Medicare HMO | Admitting: Gynecology

## 2018-07-09 ENCOUNTER — Encounter: Payer: Self-pay | Admitting: Gynecology

## 2018-07-09 VITALS — BP 134/78 | HR 108 | Temp 97.7°F | Resp 20 | Ht 65.0 in | Wt 123.2 lb

## 2018-07-09 DIAGNOSIS — Z9221 Personal history of antineoplastic chemotherapy: Secondary | ICD-10-CM

## 2018-07-09 DIAGNOSIS — Z923 Personal history of irradiation: Secondary | ICD-10-CM | POA: Diagnosis not present

## 2018-07-09 DIAGNOSIS — C7800 Secondary malignant neoplasm of unspecified lung: Secondary | ICD-10-CM

## 2018-07-09 DIAGNOSIS — C530 Malignant neoplasm of endocervix: Secondary | ICD-10-CM | POA: Diagnosis not present

## 2018-07-09 NOTE — Patient Instructions (Signed)
You are scheduled for an appointment with Dr. Alvy Bimler on Tuesday 07-16-18 to discuss treatment planning. If you experience increasing abdominal pain and or vomiting prior to your visit call Dr. Calton Dach office at [phone].

## 2018-07-09 NOTE — Progress Notes (Signed)
Consult Note: Gyn-Onc The patient is encouraged to begin taking vitamin B6 50 mg daily regarding her peripheral neuropathy.  Maureen Vaughan 67 y.o. female  Chief Complaint  Patient presents with  . 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     Plan: I reviewed the most recent PET CT scan with the patient and her husband.  I indicated that this clearly demonstrated progressive disease and then long as well as the peritoneal cavity with carcinomatosis and pelvic adenopathy.  Treatment options were reviewed including cytotoxic chemotherapy or Megace.  Regarding cytotoxic therapy Adriamycin would be reasonable.  Should she have a mismatch repair gene Pembroe would be reasonable.  Also bevacizumab has some activity.  The patient will consider these options.  We will have her return to Dr. Alvy Bimler to discuss options and weigh the pros and cons of each.  I did indicate to the patient and her husband that this is advanced disease and that responses to ongoing therapy are limited.  I did encourage her to be sure she had a will completed and to consider what she would like to do regarding travel or visiting with friends and family.  Interval history: patient returns today for continuing followup.  She is accompanied by her husband.  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  A PET scan recently showed progressive disease including progression of abdominal ascites and omental metastases new pelvic lymphadenopathy and mild increase in pulmonary metastases.  The patient notes that her appetite is poor and she has had 6 episodes of vomiting over the past month.  She also has constipation which she is managing with fiber.  It is noted that on the PET scan there is increased caliber small bowel loops concerning for a partial small bowel obstruction.   Patient is not having any pain.  She has no GU or pelvic symptoms.  HPI: 67 year old African American female seen in consultation at the request of Dr. Charlesetta Garibaldi regarding management of a newly diagnosed adenocarcinoma. The patient initially presented with postmenopausal spotting. The patient is had an extensive evaluation with the following findings. Vaginal ultrasound showed a 10 cm uterus with an intramural fibroid, left ovary is slightly enlarged measuring 2.1 x 1.5 x 2.3 cm which is described as solid complex with a vascular flow. In addition the cervix is imaged showing a solid vascular mass measuring 2.7 x 2.1 x 2.2 cm. A sonohysterogram showed no abnormalities in the endometrium. Patient had a Pap smear showing high-grade squamous intraepithelial lesion with features suspicious for invasion as well as atypical glandular cells. 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  The patient says she has had Pap smears in the last 2 years that were normal. She has no family history of gynecologic cancers. She has not had any pelvic surgery. Overall her health is good.  She was treated with pelvic radiation/brachy therapy and concurrent cisplatin.  Following completion of pelvic radiation, she was found to have clearcut pulmonary mets.  Review of Systems:10 point review of systems is negative except as noted in interval history.   Vitals: Blood pressure 134/78, pulse (!) 108, temperature 97.7 F (36.5 C), temperature source Oral, resp. rate 20, height _0  (1.651 m), weight 123 lb 3.2 oz (55.9 kg), SpO2 98 %.  Physical Exam:  Deferred     Allergies  Allergen Reactions  . Tegaderm Ag Mesh [Silver] Rash    "per pt caused rash and blisters"    (OPSITE OK TO USE)    Past Medical History:  Diagnosis Date  . Cervical adenocarcinoma [facility]) primary oncologist-  dr gorsuch/  oncologist-  dr Sondra Come   dx 07-04-2017  Stage IIB w/  lMETs to eft external iliac adenopathy--- completed adjunct chemo and external radiation (09-12-2017 to 10-19-2017) and to start direct high dose radiation 11-06-2017 x5 treatments  . Chemotherapy induced neutropenia ([place])   . Chronic nausea    due to chemo  . Complication of anesthesia    ponv 11-06-17  . History of colon polyps 2015   per pt benign  . History of radiation therapy 09/12/2017-10/29/2017   pelvis 45 Gy in 25 fractions, sidewall boost 9 Gy in 5 fractions, nodal boost 3.6 Gy in 2 fractions  . History of radiation therapy 11/06/17-11/20/17   brachytherapy via tandem ring, cervix 5.5 Gy (11/06/17), 6 Gy (11/08/17), 6 Gy (11/13/17), 5.5 Gy (11/15/17) and 5.5 Gy (11/20/17)  . Leukopenia due to antineoplastic chemotherapy ([place])   . Multiple lung nodules   . Thyroid nodule   . Wears glasses     Past Surgical History:  Procedure Laterality Date  . COLONOSCOPY  2015  . IR FLUORO GUIDE PORT INSERTION RIGHT  09/10/2017  . IR US GUIDE VASC ACCESS RIGHT  09/10/2017  . TANDEM RING INSERTION N/A 11/06/2017   Procedure: TANDEM RING INSERTION;  Surgeon: Gery Pray, MD;  Location: [facility];  Service: Urology;  Laterality: N/A;  . TANDEM RING INSERTION N/A 11/08/2017   Procedure: TANDEM RING INSERTION;  Surgeon: Gery Pray, MD;  Location: [facility];  Service: Urology;  Laterality: N/A;  . TANDEM RING INSERTION N/A 11/13/2017   Procedure: TANDEM RING INSERTION;  Surgeon: Gery Pray, MD;  Location: [facility];  Service: Urology;  Laterality: N/A;  . TANDEM RING INSERTION N/A 11/15/2017   Procedure: TANDEM RING INSERTION;  Surgeon: Gery Pray, MD;  Location: [facility];  Service: Urology;  Laterality: N/A;  . TANDEM RING INSERTION N/A 11/20/2017   Procedure: TANDEM RING INSERTION;  Surgeon: Gery Pray, MD;  Location: [facility];  Service: Urology;  Laterality: N/A;  . TONSILLECTOMY  child    Current  Outpatient Medications  Medication Sig Dispense Refill  . ondansetron (ZOFRAN) 8 MG tablet Take 1 tablet (8 mg total) by mouth every 8 (eight) hours as needed for refractory nausea / vomiting. Start on day 3 after chemo. 30 tablet 1  . prochlorperazine (COMPAZINE) 10 MG tablet Take 1 tablet (10 mg total) by mouth every 6 (six) hours as needed (Nausea or vomiting). 30 tablet 1  . dexamethasone (DECADRON) 4 MG tablet Take 5 tabs the night before and 5 tabs in the morning of chemotherapy, every 3 weeks, with food (Patient not taking: Reported on 07/09/2018) 10 tablet 1  . lidocaine-prilocaine (EMLA) cream Apply to affected area once (Patient not taking: Reported on 03/22/2018) 30 g 3   No current facility-administered medications for this visit.     Social History   Socioeconomic History  . Marital status: Married    Spouse name: Not on file  . Number of children: 1  . Years of education: Not on file  . Highest education level: Not on file  Occupational History  . Occupation: retired  Scientific laboratory technician  . Financial resource strain:  Not on file  . Food insecurity:    Worry: Not on file    Inability: Not on file  . Transportation needs:    Medical: Not on file    Non-medical: Not on file  Tobacco Use  . Smoking status: Never Smoker  . Smokeless tobacco: Never Used  Substance and Sexual Activity  . Alcohol use: No  . Drug use: No  . Sexual activity: Yes  Lifestyle  . Physical activity:    Days per week: Not on file    Minutes per session: Not on file  . Stress: Not on file  Relationships  . Social connections:    Talks on phone: Not on file    Gets together: Not on file    Attends religious service: Not on file    Active member of club or organization: Not on file    Attends meetings of clubs or organizations: Not on file    Relationship status: Not on file  . Intimate partner violence:    Fear of current or ex partner: Not on file    Emotionally abused: Not on file    Physically  abused: Not on file    Forced sexual activity: Not on file  Other Topics Concern  . Not on file  Social History Narrative  . Not on file    Family History  Problem Relation Age of Onset  . Hypertension Mother   . Dementia Mother   . Colon cancer Father 52      Marti Sleigh, MD 07/09/2018, 10:53 AM

## 2018-07-12 ENCOUNTER — Telehealth: Payer: Self-pay

## 2018-07-12 NOTE — Telephone Encounter (Signed)
She called and left a message to call her.  Called back. She is asking if it is okay to have a flu shot. Told her per Dr. Alvy Bimler, she can have a flu shot. She verbalized understanding.

## 2018-07-16 ENCOUNTER — Inpatient Hospital Stay: Payer: Medicare HMO | Attending: Hematology and Oncology | Admitting: Hematology and Oncology

## 2018-07-16 ENCOUNTER — Encounter: Payer: Self-pay | Admitting: Hematology and Oncology

## 2018-07-16 DIAGNOSIS — E042 Nontoxic multinodular goiter: Secondary | ICD-10-CM | POA: Diagnosis not present

## 2018-07-16 DIAGNOSIS — Z7189 Other specified counseling: Secondary | ICD-10-CM

## 2018-07-16 DIAGNOSIS — R188 Other ascites: Secondary | ICD-10-CM | POA: Insufficient documentation

## 2018-07-16 DIAGNOSIS — C539 Malignant neoplasm of cervix uteri, unspecified: Secondary | ICD-10-CM | POA: Diagnosis present

## 2018-07-16 DIAGNOSIS — Z923 Personal history of irradiation: Secondary | ICD-10-CM

## 2018-07-16 DIAGNOSIS — Z79899 Other long term (current) drug therapy: Secondary | ICD-10-CM | POA: Diagnosis not present

## 2018-07-16 DIAGNOSIS — C786 Secondary malignant neoplasm of retroperitoneum and peritoneum: Secondary | ICD-10-CM | POA: Diagnosis not present

## 2018-07-16 DIAGNOSIS — C7800 Secondary malignant neoplasm of unspecified lung: Secondary | ICD-10-CM

## 2018-07-16 DIAGNOSIS — C530 Malignant neoplasm of endocervix: Secondary | ICD-10-CM

## 2018-07-16 DIAGNOSIS — Z9221 Personal history of antineoplastic chemotherapy: Secondary | ICD-10-CM

## 2018-07-16 DIAGNOSIS — Z8542 Personal history of malignant neoplasm of other parts of uterus: Secondary | ICD-10-CM

## 2018-07-16 NOTE — Assessment & Plan Note (Signed)
Unfortunately, she has signs of cancer progression Currently, she is not symptomatic I reviewed the current guidelines We discussed the risk and benefits of palliative chemotherapy versus hospice Given her significant risk of pancytopenia and background neuropathy, I do not recommend aggressive chemotherapy that could jeopardize her quality of life We discussed potential treatment with single agent gemcitabine, bevacizumab or pemetrexed Response rates were discussed and expected to be low given her refractory nature of disease She is undecided She would like to go home and think about it and discuss further with family She will call me once she has made her decision

## 2018-07-16 NOTE — Progress Notes (Signed)
[place] OFFICE PROGRESS NOTE  Patient Care Team: Cloward, [name], MD as PCP - General (Internal Medicine) Heath Lark, MD as Consulting Physician (Hematology and Oncology) Gery Pray, MD as Consulting Physician (Radiation Oncology) Marti Sleigh, MD as Attending Physician (Gynecology)  ASSESSMENT & PLAN:  Malignant neoplasm of cervix [facility]) Unfortunately, she has signs of cancer progression Currently, she is not symptomatic I reviewed the current guidelines We discussed the risk and benefits of palliative chemotherapy versus hospice Given her significant risk of pancytopenia and background neuropathy, I do not recommend aggressive chemotherapy that could jeopardize her quality of life We discussed potential treatment with single agent gemcitabine, bevacizumab or pemetrexed Response rates were discussed and expected to be low given her refractory nature of disease She is undecided She would like to go home and think about it and discuss further with family She will call me once she has made her decision  Goals of care, counseling/discussion We had extensive discussion about goals of care The patient does not wish to be treated aggressively and quality of life is very important to her She did not want to be resuscitated and does not wish hemodialysis or feeding tube placement in the events that she has progressive decline of performance status We discussed prognosis, likely measured in less than 6 months    No orders of the defined types were placed in this encounter.   INTERVAL HISTORY: Please see below for problem oriented charting. She returns for further follow-up Recent imaging study showed progression of disease She denies significant peripheral neuropathy No recent infection, fever or chills She denies pain No recent cough, chest pain or shortness of breath  SUMMARY OF ONCOLOGIC HISTORY:   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[place])   07/04/2017  Initial Diagnosis    She presented to the GYN clinic with postmenopausal bleeding. Examination revealed abnormal cervix. Vaginal ultrasound showed a 10 cm uterus with an intramural fibroid, left ovary is slightly enlarged measuring 2.1 x 1.5 x 2.3 cm which is described as solid complex with a vascular flow. In addition the cervix is imaged showing a solid vascular mass measuring 2.7 x 2.1 x 2.2 cm. A sonohysterogram showed no abnormalities in the endometrium. Patient had a Pap smear showing high-grade squamous intraepithelial lesion with features suspicious for invasion as well as atypical glandular cells. 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    08/29/2017 PET scan    1. Cervical malignancy with maximum SUV 6.0, dominant cervical mass measuring 6.2 by 6.0 by 7.3 cm. 2. 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 3. There 12 scattered small pulmonary nodules measuring up to 5 mm in diameter. No hypermetabolic activity but these are below sensitive PET-CT size thresholds. These could be postinflammatory or less likely neoplastic, surveillance is recommended. 4. Hypodense thyroid nodules are not hypermetabolic which strongly favors benign etiology.    09/10/2017 Procedure    Placement of a subcutaneous port device.    09/12/2017 - 10/10/2017 Chemotherapy    The patient had weekly cisplatin for chemotherapy treatment.     09/12/2017 - 11/20/2017 Radiation Therapy    Radiation treatment dates:  External beam: 09/12/2017-10/29/2017, brachytherapy: 11/06/2017, 11/08/2017, 11/13/2017, 11/15/2017, 11/20/2017  Site/dose:   1. pelvis, 1.8 Gy in 25 fractions for a total dose of 45 Gy  2. Sidewall Boost, 1.8 Gy in 5 fractions for a total dose of 9 Gy                      3. Nodal boost  2, 1.8 Gy in 2 fractions for a total dose of 3.6 Gy                      4.  Cervix, 5.5 Gy (11/06/17), 6 Gy (11/08/17), 6 Gy (11/13/17), 5.5 Gy (11/15/17), 5.5 Gy (11/20/17)  Beams/energy:  1. 3D, 15X                              2. 3D, 15X                              3. 3D, 15X                              4. Brachytherapy via tandem-ring, [id number]    12/27/2017 PET scan    1. Interval mixed response to therapy. Interval decrease in hypermetabolism associated with the cervical mass. Left pelvic sidewall hypermetabolic lymph node seen previously has resolved. 2. Interval progression of bilateral pulmonary nodules with demonstrable FDG accumulation in these nodules on today's study. Imaging features are compatible with metastatic disease. There is a new 3 mm nodule in the left upper lobe. 3. Focus of FDG accumulation in the left adnexal region, likely related to the left ovary but tracks up along the left side of the uterus. This is indeterminate and may be related to the ovary although metastatic involvement is not excluded. Close attention on follow-up recommended. Pelvic MRI without and with contrast may possibly provide additional insight as clinically warranted.    01/24/2018 -  Chemotherapy    The patient had carboplatin and taxol for chemotherapy treatment.      03/21/2018 PET scan    1. Stable exam with no clear trend towards improving or progressing disease. The bilateral pulmonary nodules measure similar in size today and show stable to minimal decrease in hypermetabolism since prior study. 2. Stable appearance of hypermetabolism in the left adnexal space, potentially related to the ovary. Continued attention on follow-up recommended. 3. No new or progressive hypermetabolic disease identified on today's study.    07/09/2018 PET scan    1. Interval disease progression. Increase in volume of abdominopelvic ascites with extensive omental tumor deposits. New right pelvic adenopathy with progressive tumor in the lower pelvis encasing the uterus. 2. Mild increase in size  with moderate increase in uptake associated with previous index pulmonary metastasis. 3. Increase in caliber of small bowel loops, which in the setting progressive peritoneal disease is concerning for at least partial small bowel obstruction. Clinical correlation advised     Metastasis to lung ([place])   01/15/2018 Initial Diagnosis    Metastasis to lung ([place])    01/21/2018 -  Chemotherapy    The patient had palonosetron (ALOXI) injection 0.25 mg, 0.25 mg, Intravenous,  Once, 6 of 6 cycles Administration: 0.25 mg (01/24/2018), 0.25 mg (02/14/2018), 0.25 mg (03/07/2018), 0.25 mg (03/28/2018), 0.25 mg (04/19/2018), 0.25 mg (05/23/2018) CARBOplatin (PARAPLATIN) 420 mg in sodium chloride 0.9 % 250 mL chemo infusion, 420 mg (100 % of original dose 417 mg), Intravenous,  Once, 6 of 6 cycles Dose modification:   (  original dose 417 mg, Cycle 1), 420 mg (original dose 417 mg, Cycle 2), 375.3 mg (90 % of original dose 417 mg, Cycle 6, Reason: Dose Not Tolerated) Administration: 420 mg (01/24/2018), 420 mg (02/14/2018), 420 mg (03/07/2018), 420 mg (03/28/2018), 420 mg (04/19/2018), 380 mg (05/23/2018) PACLitaxel (TAXOL) 312 mg in sodium chloride 0.9 % 500 mL chemo infusion (> 80mg /m2), 175 mg/m2 = 312 mg, Intravenous,  Once, 6 of 6 cycles Dose modification: 140 mg/m2 (80 % of original dose 175 mg/m2, Cycle 4, Reason: Dose Not Tolerated) Administration: 312 mg (01/24/2018), 312 mg (02/14/2018), 312 mg (03/07/2018), 246 mg (03/28/2018), 246 mg (04/19/2018), 246 mg (05/23/2018)  for chemotherapy treatment.      REVIEW OF SYSTEMS:   Constitutional: Denies fevers, chills or abnormal weight loss Eyes: Denies blurriness of vision Ears, nose, mouth, throat, and face: Denies mucositis or sore throat Respiratory: Denies cough, dyspnea or wheezes Cardiovascular: Denies palpitation, chest discomfort or lower extremity swelling Gastrointestinal:  Denies nausea, heartburn or change in bowel habits Skin: Denies abnormal skin rashes Lymphatics:  Denies new lymphadenopathy or easy bruising Neurological:Denies numbness, tingling or new weaknesses Behavioral/Psych: Mood is stable, no new changes  All other systems were reviewed with the patient and are negative.  I have reviewed the past medical history, past surgical history, social history and family history with the patient and they are unchanged from previous note.  ALLERGIES:  is allergic to tegaderm ag mesh [silver].  MEDICATIONS:  Current Outpatient Medications  Medication Sig Dispense Refill  . dexamethasone (DECADRON) 4 MG tablet Take 5 tabs the night before and 5 tabs in the morning of chemotherapy, every 3 weeks, with food (Patient not taking: Reported on 07/09/2018) 10 tablet 1  . lidocaine-prilocaine (EMLA) cream Apply to affected area once (Patient not taking: Reported on 03/22/2018) 30 g 3  . ondansetron (ZOFRAN) 8 MG tablet Take 1 tablet (8 mg total) by mouth every 8 (eight) hours as needed for refractory nausea / vomiting. Start on day 3 after chemo. 30 tablet 1  . prochlorperazine (COMPAZINE) 10 MG tablet Take 1 tablet (10 mg total) by mouth every 6 (six) hours as needed (Nausea or vomiting). 30 tablet 1   No current facility-administered medications for this visit.     PHYSICAL EXAMINATION: ECOG PERFORMANCE STATUS: 1 - Symptomatic but completely ambulatory  Vitals:   07/16/18 0818  BP: 137/77  Pulse: (!) 122  Resp: 18  Temp: 98.4 F (36.9 C)  SpO2: 100%   Filed Weights   07/16/18 0818  Weight: 123 lb 12.8 oz (56.2 kg)    GENERAL:alert, no distress and comfortable NEURO: alert & oriented x 3 with fluent speech, no focal motor/sensory deficits  LABORATORY DATA:  I have reviewed the data as listed    Component Value Date/Time   NA 143 07/08/2018 1255   NA 141 10/15/2017 0900   K 4.0 07/08/2018 1255   K 4.2 10/15/2017 0900   CL 104 07/08/2018 1255   CO2 27 07/08/2018 1255   CO2 28 10/15/2017 0900   GLUCOSE 103 (H) 07/08/2018 1255   GLUCOSE 105  10/15/2017 0900   BUN 16 07/08/2018 1255   BUN 10.8 10/15/2017 0900   CREATININE 0.85 07/08/2018 1255   CREATININE 1.1 10/15/2017 0900   CALCIUM 9.7 07/08/2018 1255   CALCIUM 9.3 10/15/2017 0900   PROT 7.1 07/08/2018 1255   PROT 7.1 10/15/2017 0900   ALBUMIN 3.4 (L) 07/08/2018 1255   ALBUMIN 3.7 10/15/2017 0900   AST 13 (L)  07/08/2018 1255   AST 17 10/15/2017 0900   ALT <6 07/08/2018 1255   ALT 19 10/15/2017 0900   ALKPHOS 64 07/08/2018 1255   ALKPHOS 95 10/15/2017 0900   BILITOT 0.3 07/08/2018 1255   BILITOT 0.29 10/15/2017 0900   GFRNONAA >60 07/08/2018 1255   GFRAA >60 07/08/2018 1255    No results found for: SPEP, UPEP  Lab Results  Component Value Date   WBC 4.0 07/08/2018   NEUTROABS 3.3 07/08/2018   HGB 9.2 (L) 07/08/2018   HCT 28.9 (L) 07/08/2018   MCV 94.8 07/08/2018   PLT 197 07/08/2018      Chemistry      Component Value Date/Time   NA 143 07/08/2018 1255   NA 141 10/15/2017 0900   K 4.0 07/08/2018 1255   K 4.2 10/15/2017 0900   CL 104 07/08/2018 1255   CO2 27 07/08/2018 1255   CO2 28 10/15/2017 0900   BUN 16 07/08/2018 1255   BUN 10.8 10/15/2017 0900   CREATININE 0.85 07/08/2018 1255   CREATININE 1.1 10/15/2017 0900      Component Value Date/Time   CALCIUM 9.7 07/08/2018 1255   CALCIUM 9.3 10/15/2017 0900   ALKPHOS 64 07/08/2018 1255   ALKPHOS 95 10/15/2017 0900   AST 13 (L) 07/08/2018 1255   AST 17 10/15/2017 0900   ALT <6 07/08/2018 1255   ALT 19 10/15/2017 0900   BILITOT 0.3 07/08/2018 1255   BILITOT 0.29 10/15/2017 0900       RADIOGRAPHIC STUDIES: I have personally reviewed the radiological images as listed and agreed with the findings in the report. Nm Pet Image Restag (ps) Skull Base To Thigh  Result Date: 07/09/2018 CLINICAL DATA:  Subsequent treatment strategy for cervical cancer. EXAM: NUCLEAR MEDICINE PET SKULL BASE TO THIGH TECHNIQUE: 6.0 mCi F-18 FDG was injected intravenously. Full-ring PET imaging was performed from the  skull base to thigh after the radiotracer. CT data was obtained and used for attenuation correction and anatomic localization. Fasting blood glucose: 99 mg/dl COMPARISON:  03/21/2018 FINDINGS: Mediastinal blood pool activity: SUV max 2.44 NECK: No hypermetabolic lymph nodes in the neck. Incidental CT findings: none CHEST: Multiple bilateral hypermetabolic pulmonary nodules are identified. These are similar in number to the previous exam. -index left upper lobe lung nodule measures 9 mm with SUV max of 6.6, image 15/8. previously 7 mm with SUV max of 2.1. -index right upper lobe lung nodule measures 1 cm within SUV max of 5.3, image 15/8. Previously 0.8 cm within SUV max of 1.8. -index anterior right upper lobe lung nodule measures 5 mm within SUV max of 1.69, image 74/2 no hypermetabolic axillary, supraclavicular, mediastinal or hilar adenopathy. Previously 5 mm within SUV max of 0.7 Incidental CT findings: none ABDOMEN/PELVIS: No abnormal radiotracer activity identified within the liver, pancreas, spleen, or adrenal glands. No hypermetabolic retroperitoneal adenopathy. Several new hypermetabolic right pelvic lymph nodes are identified. -Index right external iliac node measures 1.4 cm within SUV max of 7.82, image 156/4. New from comparison exam. -New right pelvic sidewall node measures 1 cm within SUV max of 10.54, image 162/4. Moderate volume of ascites is increased from previous exam. Interval development extensive omental tumor. -Within the ventral abdomen omental deposit measures 3 x 1.7 cm within SUV max of 10.19. -Tumor along the left side of the uterus measures 6.4 cm within SUV max of 21.38. Incidental CT findings: Increased caliber of the small bowel loops is new from previous exam, measuring up to 3.2 cm.  In the setting of progressive peritoneal carcinomatosis findings are worrisome for at least partial bowel obstruction. No hydronephrosis identified bilaterally. SKELETON: No focal hypermetabolic activity  to suggest skeletal metastasis. Incidental CT findings: none IMPRESSION: 1. Interval disease progression. Increase in volume of abdominopelvic ascites with extensive omental tumor deposits. New right pelvic adenopathy with progressive tumor in the lower pelvis encasing the uterus. 2. Mild increase in size with moderate increase in uptake associated with previous index pulmonary metastasis. 3. Increase in caliber of small bowel loops, which in the setting progressive peritoneal disease is concerning for at least partial small bowel obstruction. Clinical correlation advised. 4. These results will be called to the ordering clinician or representative by the Radiologist Assistant, and communication documented in the PACS or zVision Dashboard. Electronically Signed   By: Kerby Moors M.D.   On: 07/09/2018 08:36    All questions were answered. The patient knows to call the clinic with any problems, questions or concerns. No barriers to learning was detected.  I spent 30 minutes counseling the patient face to face. The total time spent in the appointment was 40 minutes and more than 50% was on counseling and review of test results  Heath Lark, MD 07/16/2018 2:25 PM

## 2018-07-16 NOTE — Assessment & Plan Note (Signed)
We had extensive discussion about goals of care The patient does not wish to be treated aggressively and quality of life is very important to her She did not want to be resuscitated and does not wish hemodialysis or feeding tube placement in the events that she has progressive decline of performance status We discussed prognosis, likely measured in less than 6 months

## 2018-07-17 ENCOUNTER — Telehealth: Payer: Self-pay

## 2018-07-17 NOTE — Telephone Encounter (Signed)
She called and left a message that she has made her decision and she is interested in palliative care.  Called back to verify above. She would like palliative but is not even sure about what the difference between hospice and palliative. She has no preference on the agency.

## 2018-07-17 NOTE — Telephone Encounter (Signed)
Just to be clear that she does not want chemo? I would get [facility] referral to start They can do a home visit and explain to her different services

## 2018-07-17 NOTE — Telephone Encounter (Signed)
Called back and clarified if she wants more chemotherapy. She does not want anymore chemotherapy.  Referral called to [facility], given below message from Dr. Alvy Bimler. She verbalized understanding.

## 2018-07-22 ENCOUNTER — Telehealth: Payer: Self-pay

## 2018-07-22 NOTE — Telephone Encounter (Signed)
She called regarding referral to [facility] on 10/2. No one has called her to regarding referral.  Called and left a message on voicemail for [place] [facility]. Requesting call back

## 2018-07-23 NOTE — Telephone Encounter (Signed)
Called [place] with [facility]. She called Ms. Westerman this morning.

## 2018-10-28 NOTE — Progress Notes (Signed)
FMLA mailed to patient address on file and to [employer], Benefits at [employer]. [place], [place] [address].

## 2018-11-05 ENCOUNTER — Telehealth: Payer: Self-pay

## 2018-11-05 NOTE — Telephone Encounter (Signed)
[facility] called and left a message. Patient is moving to hospice home today for management of nausea and constipation.

## 2018-11-06 ENCOUNTER — Telehealth: Payer: Self-pay

## 2018-11-06 NOTE — Telephone Encounter (Signed)
[facility] [employer] attending physician statement to [place] to # [phone].

## 2018-11-08 IMAGING — US IR FLUORO GUIDE CV LINE*R*
1 series · 1 of 1 positions shown · non-contrast
Comparison: None.

INDICATION: 65-year-old with malignant neoplasm of cervix. Port-A-Cath needed
for treatment.

EXAM:
FLUOROSCOPIC AND ULTRASOUND GUIDED PLACEMENT OF A SUBCUTANEOUS PORT

[Series 1: ir fluoro guide cv line*right* · 1 of 1 slices shown]
[im 1/1]
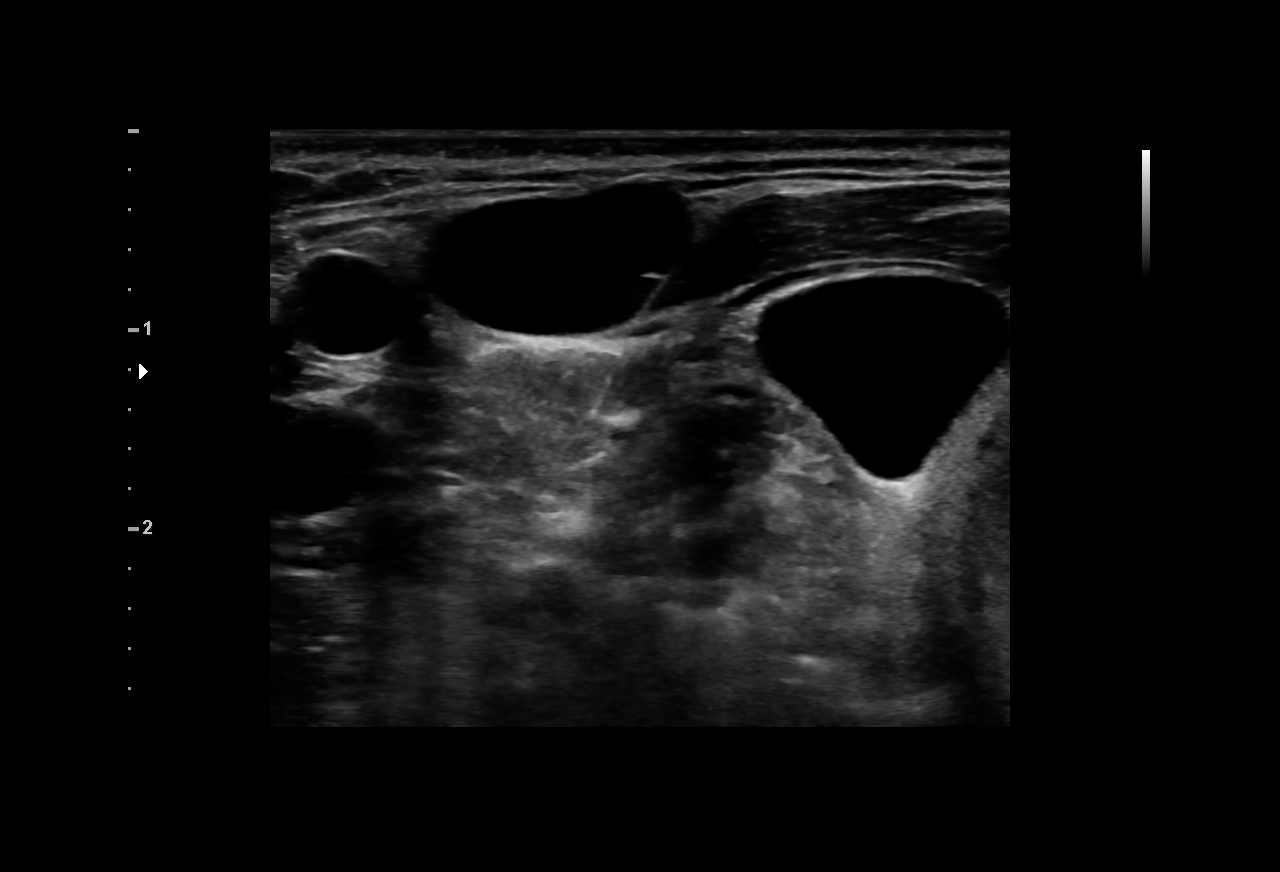

[1 of 1 positions shown; findings below may reference images not displayed]

MEDICATIONS:
Ancef 2 g; The antibiotic was administered within an appropriate
time interval prior to skin puncture.

ANESTHESIA/SEDATION:
Versed 1.0 mg IV; Fentanyl 50 mcg IV;

Moderate Sedation Time:  30 minutes

The patient was continuously monitored during the procedure by the
interventional radiology nurse under my direct supervision.

FLUOROSCOPY TIME:  18 seconds

COMPLICATIONS:
None immediate.

PROCEDURE:
The procedure, risks, benefits, and alternatives were explained to
the patient. Questions regarding the procedure were encouraged and
answered. The patient understands and consents to the procedure.

Patient was placed supine on the interventional table. Ultrasound
confirmed a patent right internal jugular vein. The right chest and
neck were cleaned with a skin antiseptic and a sterile drape was
placed. Maximal barrier sterile technique was utilized including
caps, mask, sterile gowns, sterile gloves, sterile drape, hand
hygiene and skin antiseptic. The right neck was anesthetized with 1%
lidocaine. Small incision was made in the right neck with a blade.
Micropuncture set was placed in the right internal jugular vein with
ultrasound guidance. The micropuncture wire was used for measurement
purposes. The right chest was anesthetized with 1% lidocaine. #15
blade was used to make an incision and a subcutaneous port pocket
was formed. 8 french Power Port was assembled. Subcutaneous tunnel
was formed with a stiff tunneling device. The port catheter was
brought through the subcutaneous tunnel. The port was placed in the
subcutaneous pocket. The micropuncture set was exchanged for a
peel-away sheath. The catheter was placed through the peel-away
sheath and the tip was positioned at the superior cavoatrial
junction. Catheter placement was confirmed with fluoroscopy. The
port was accessed and flushed with heparinized saline. The port
pocket was closed using two layers of absorbable sutures and
Dermabond. The vein skin site was closed using a single layer of
absorbable suture and Dermabond. Sterile dressings were applied.
Patient tolerated the procedure well without an immediate
complication. Ultrasound and fluoroscopic images were taken and
saved for this procedure.
IMPRESSION: Placement of a subcutaneous port device.

## 2018-11-16 DEATH — deceased

## 2019-01-30 ENCOUNTER — Encounter: Payer: Self-pay | Admitting: Hematology and Oncology

## 2019-05-19 IMAGING — CT NM PET TUM IMG RESTAG (PS) SKULL BASE T - THIGH
8 series · 21 of 25 positions shown · non-contrast
Comparison: 12/26/2017

CLINICAL DATA: Subsequent treatment strategy for cervical cancer.

EXAM:
NUCLEAR MEDICINE PET SKULL BASE TO THIGH
TECHNIQUE: 6.7 mCi F-18 FDG was injected intravenously. Full-ring PET imaging
was performed from the skull base to thigh after the radiotracer. CT
data was obtained and used for attenuation correction and anatomic
localization.
Fasting blood glucose: 101 mg/dl

[Series 3: pet sk_thigh ac · axial · 5.0mm · 4.07mm/px · z∈[-1551,-667]mm · 4 of 222 slices shown]
[im 1/222]
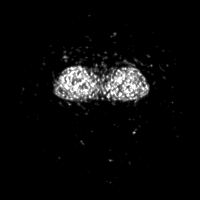
[im 56/222]
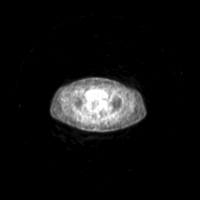
[im 111/222]
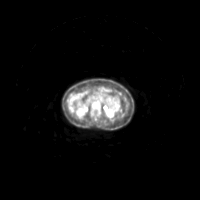
[im 222/222]
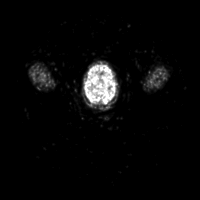

[Series 4: ct sk_thigh 5.0 b31f · axial · 5.0mm · 0.98mm/px · z∈[-1551,-891]mm · 4 of 222 slices shown]
[im 1/222]
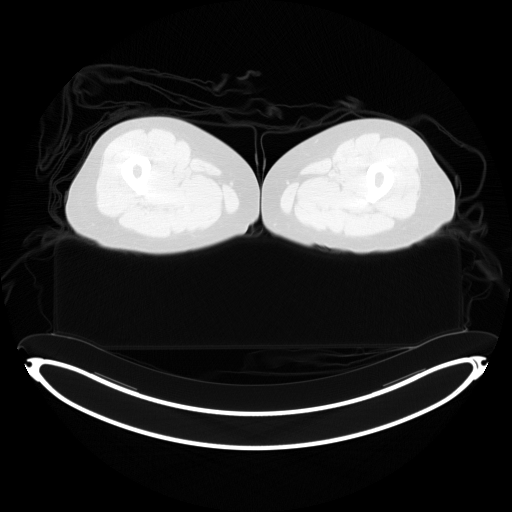
[im 56/222]
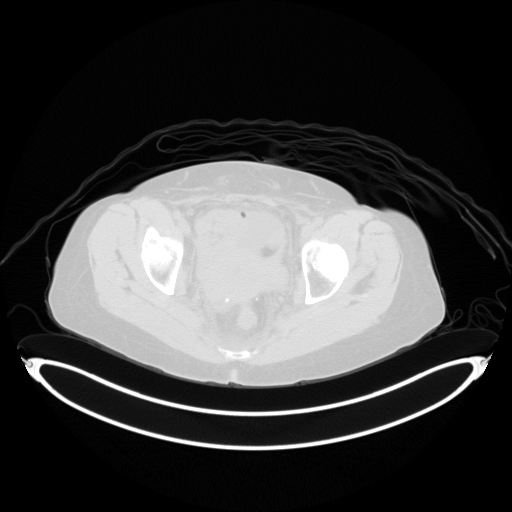
[im 111/222]
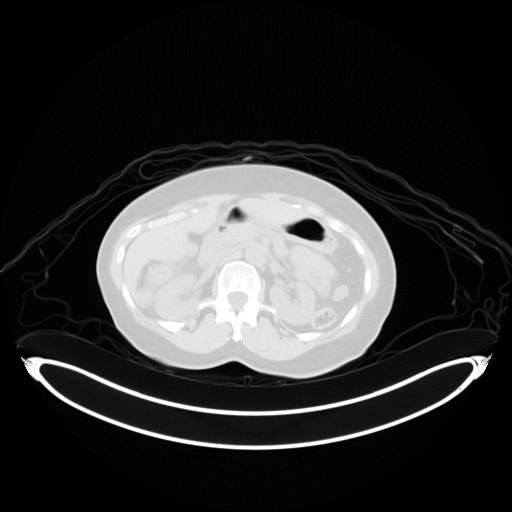
[im 166/222]
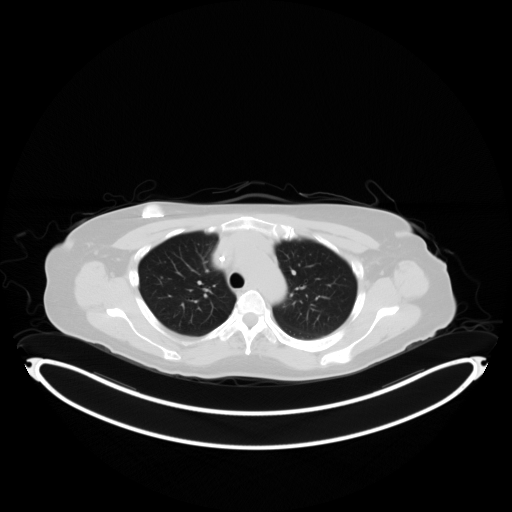

[Series 5: pet sk_thigh nac · axial · 5.0mm · 4.07mm/px · z∈[-1551,-667]mm · 5 of 222 slices shown]
[im 1/222]
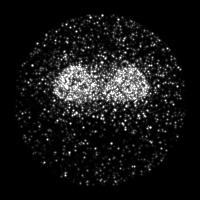
[im 56/222]
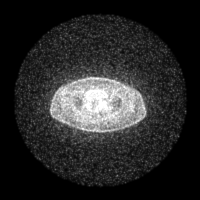
[im 111/222]
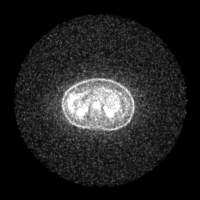
[im 166/222]
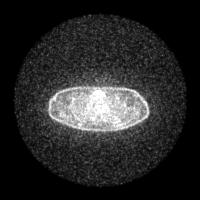
[im 222/222]
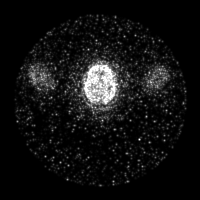

[Series 8: ct sk_thigh 5.0 b70f lung_bone · axial · 5.0mm · 0.61mm/px · 1 of 64 slices shown]
[im 64/64  bone]
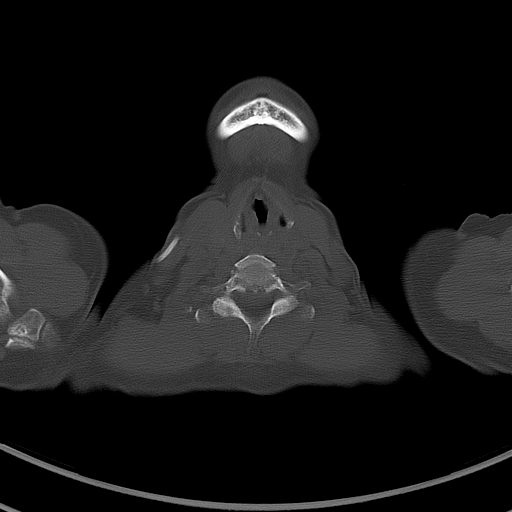

[Series 603: mip range 3 · coronal · 1.83mm/px · 1 of 32 slices shown]
[im 1/32]
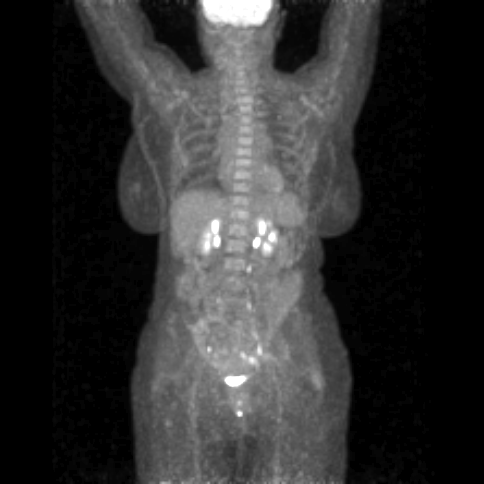

[Series 604: range-ct sk_thigh 5.0 (id)<alpha range> · 1 of 56 slices shown (1 of 2)]
[im 1/56]
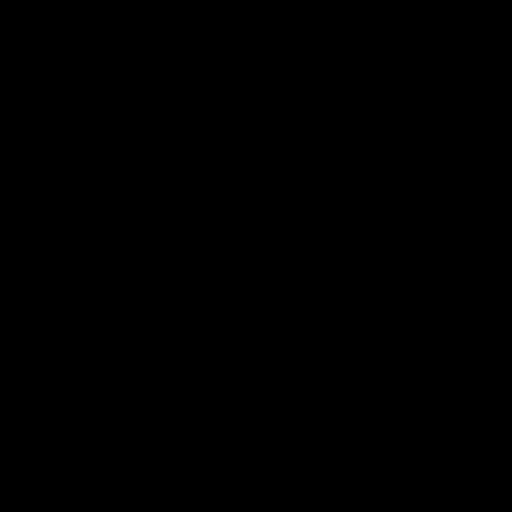

[Series 605: range-ct sk_thigh 5.0 (id)<alpha range> · 4 of 214 slices shown (2 of 2)]
[im 1/214]
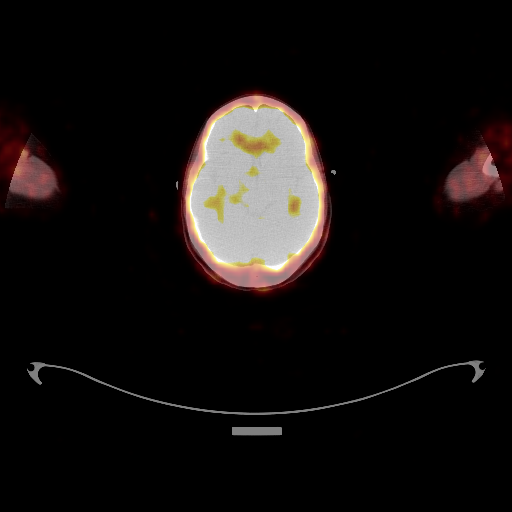
[im 54/214]
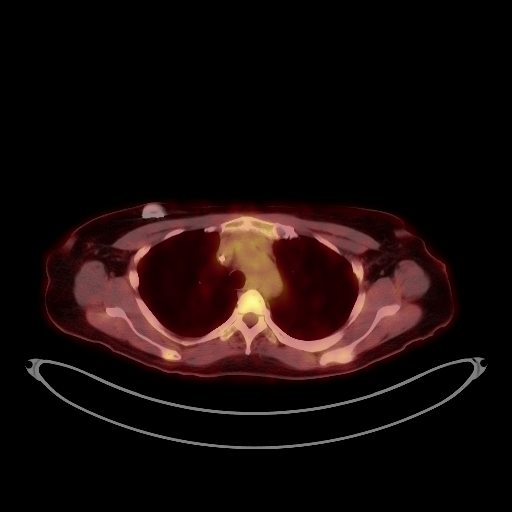
[im 160/214]
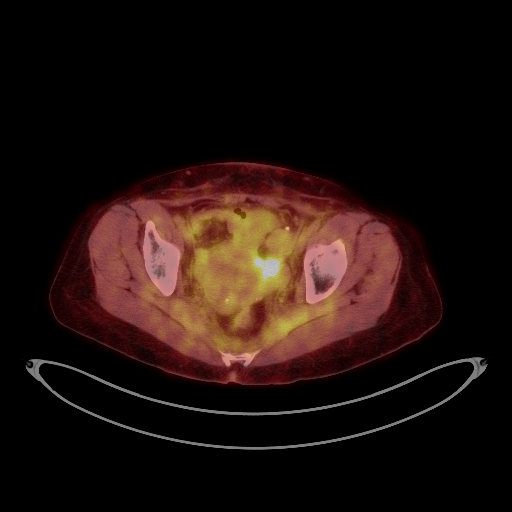
[im 214/214]
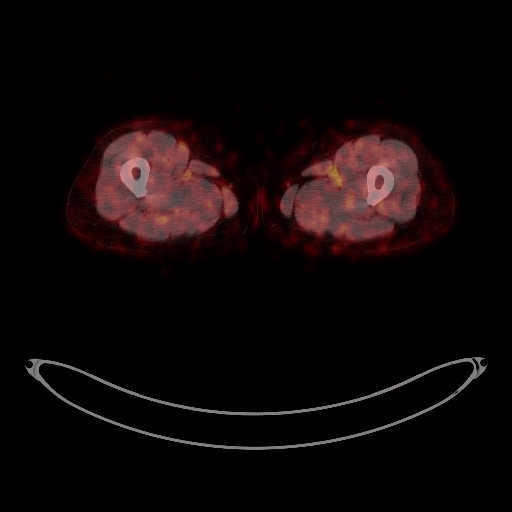

[Series 1351: results mm oncology reading · 1.0mm · 0.50mm/px · 1 of 4 slices shown]
[im 1/4]
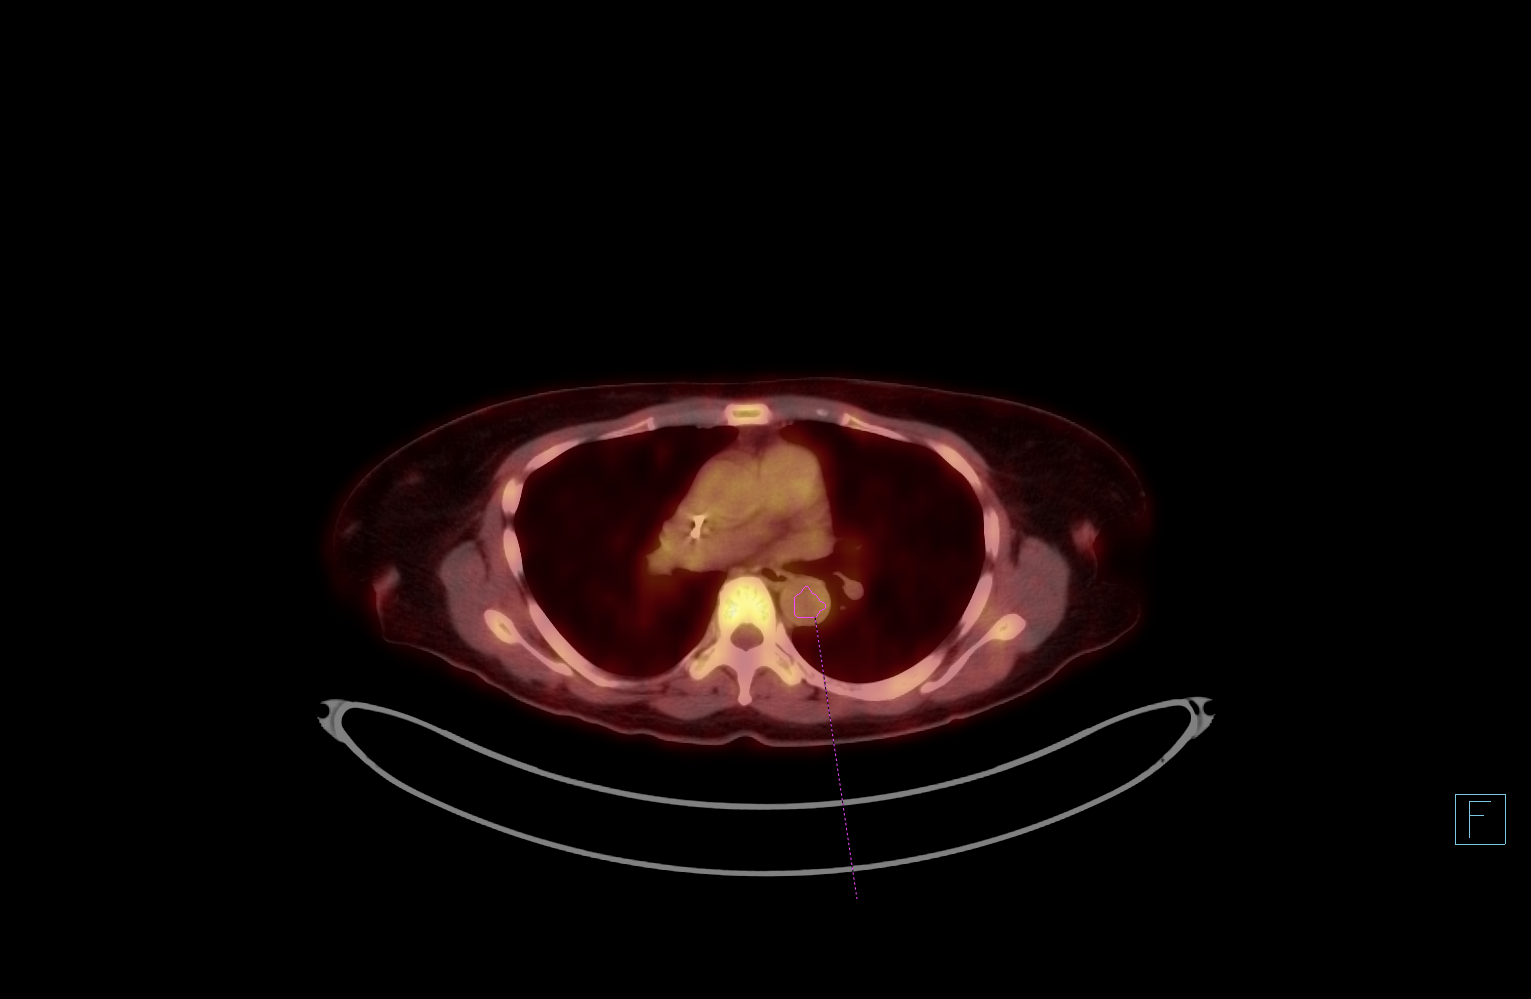

[21 of 25 positions shown; findings below may reference images not displayed]

FINDINGS: Mediastinal blood pool activity: SUV max

NECK: No hypermetabolic lymph nodes in the neck.

Incidental CT findings: Stable 3.6 cm low-attenuation right thyroid
nodule with additional small nodules in the left lobe and isthmus.

CHEST: Scattered bilateral pulmonary nodules are again identified.
Continued slight progression evident. Index nodule left upper lobe
(image 19/series 8) measures 7 mm today compared to 6 mm previously.
SUV max = 2.1 today compared to 2.9 previously.

Index posterior right upper lobe nodule ([DATE]) measures 8 mm today
compared to 6 mm previously. SUV max = is 1.8 today compared to
previously.

3rd index nodule identified previously in the anterior right upper
lobe (35/8 today) measures 5 mm today compared to 6 mm previously.
SUV max = 0.7 on today's study. Scattered additional tiny bilateral
pulmonary nodules again noted. No definite new nodule or mass
evident.

Incidental CT findings: Right Port-A-Cath tip is positioned in the
distal SVC.

ABDOMEN/PELVIS: No abnormal hypermetabolic activity within the
liver, pancreas, adrenal glands, or spleen. No hypermetabolic lymph
nodes in the abdomen or pelvis.

Similar hypermetabolism is identified in the left adnexal space,
mapping to a 3.3 x 1.9 cm soft tissue structure (166/4). This is
similar to prior study and likely represents the left ovary although
continued close attention on follow-up recommended.

Tiny nonenlarged left pelvic sidewall lymph node (161/4) is stable.

Incidental CT findings: Trace fluid is seen in the right paracolic
gutter and cul-de-sac. There is some edema in the soft tissues of
the pelvic floor, likely related to radiation therapy.

SKELETON: Diffuse, mildly increased FDG uptake in the marrow space
compared to prior study. Appearance presumably related to
stimulatory marrow effects of treatment.

Incidental CT findings: No worrisome lytic or sclerotic osseous
abnormality.
IMPRESSION: 1. Stable exam with no clear trend towards improving or progressing
disease. The bilateral pulmonary nodules measure similar in size
today and show stable to minimal decrease in hypermetabolism since
prior study.
2. Stable appearance of hypermetabolism in the left adnexal space,
potentially related to the ovary. Continued attention on follow-up
recommended.
3. No new or progressive hypermetabolic disease identified on
today's study.
